# Patient Record
Sex: Female | Born: 1935
Health system: Southern US, Community
[De-identification: ages and names within clinical notes are randomized; demographics above are authoritative.]

## PROBLEM LIST (undated history)

## (undated) DIAGNOSIS — R0600 Dyspnea, unspecified: Secondary | ICD-10-CM

## (undated) DIAGNOSIS — M1990 Unspecified osteoarthritis, unspecified site: Secondary | ICD-10-CM

## (undated) DIAGNOSIS — E079 Disorder of thyroid, unspecified: Secondary | ICD-10-CM

## (undated) DIAGNOSIS — J189 Pneumonia, unspecified organism: Secondary | ICD-10-CM

## (undated) DIAGNOSIS — J449 Chronic obstructive pulmonary disease, unspecified: Secondary | ICD-10-CM

## (undated) DIAGNOSIS — I10 Essential (primary) hypertension: Secondary | ICD-10-CM

## (undated) DIAGNOSIS — M8580 Other specified disorders of bone density and structure, unspecified site: Secondary | ICD-10-CM

## (undated) DIAGNOSIS — E039 Hypothyroidism, unspecified: Secondary | ICD-10-CM

## (undated) HISTORY — PX: TUBAL LIGATION: SHX77

## (undated) HISTORY — PX: TONSILLECTOMY AND ADENOIDECTOMY: SUR1326

## (undated) HISTORY — DX: Other specified disorders of bone density and structure, unspecified site: M85.80

## (undated) HISTORY — DX: Chronic obstructive pulmonary disease, unspecified: J44.9

## (undated) HISTORY — PX: EYE SURGERY: SHX253

## (undated) HISTORY — DX: Disorder of thyroid, unspecified: E07.9

## (undated) HISTORY — PX: APPENDECTOMY: SHX54

---

## 2001-05-12 ENCOUNTER — Encounter: Payer: Self-pay | Admitting: Family Medicine

## 2001-05-12 ENCOUNTER — Encounter: Admission: RE | Admit: 2001-05-12 | Discharge: 2001-05-12 | Payer: Self-pay | Admitting: Family Medicine

## 2002-03-01 ENCOUNTER — Inpatient Hospital Stay (HOSPITAL_COMMUNITY): Admission: EM | Admit: 2002-03-01 | Discharge: 2002-03-03 | Payer: Self-pay | Admitting: Emergency Medicine

## 2002-03-01 ENCOUNTER — Encounter: Payer: Self-pay | Admitting: Emergency Medicine

## 2002-04-10 ENCOUNTER — Encounter: Admission: RE | Admit: 2002-04-10 | Discharge: 2002-04-10 | Payer: Self-pay | Admitting: Family Medicine

## 2002-04-10 ENCOUNTER — Encounter: Payer: Self-pay | Admitting: Family Medicine

## 2002-07-31 ENCOUNTER — Encounter: Admission: RE | Admit: 2002-07-31 | Discharge: 2002-07-31 | Payer: Self-pay | Admitting: Family Medicine

## 2002-07-31 ENCOUNTER — Encounter: Payer: Self-pay | Admitting: Family Medicine

## 2003-09-19 ENCOUNTER — Encounter: Admission: RE | Admit: 2003-09-19 | Discharge: 2003-09-19 | Payer: Self-pay | Admitting: Family Medicine

## 2004-02-29 ENCOUNTER — Other Ambulatory Visit: Admission: RE | Admit: 2004-02-29 | Discharge: 2004-02-29 | Payer: Self-pay | Admitting: Family Medicine

## 2004-10-30 ENCOUNTER — Encounter: Admission: RE | Admit: 2004-10-30 | Discharge: 2004-10-30 | Payer: Self-pay | Admitting: Family Medicine

## 2006-01-04 ENCOUNTER — Encounter: Admission: RE | Admit: 2006-01-04 | Discharge: 2006-01-04 | Payer: Self-pay | Admitting: Family Medicine

## 2006-03-22 ENCOUNTER — Encounter: Admission: RE | Admit: 2006-03-22 | Discharge: 2006-03-22 | Payer: Self-pay | Admitting: Family Medicine

## 2007-02-03 ENCOUNTER — Encounter: Admission: RE | Admit: 2007-02-03 | Discharge: 2007-02-03 | Payer: Self-pay | Admitting: Family Medicine

## 2007-03-23 ENCOUNTER — Other Ambulatory Visit: Admission: RE | Admit: 2007-03-23 | Discharge: 2007-03-23 | Payer: Self-pay | Admitting: Family Medicine

## 2008-02-24 ENCOUNTER — Encounter: Admission: RE | Admit: 2008-02-24 | Discharge: 2008-02-24 | Payer: Self-pay | Admitting: Family Medicine

## 2009-02-25 ENCOUNTER — Encounter: Admission: RE | Admit: 2009-02-25 | Discharge: 2009-02-25 | Payer: Self-pay | Admitting: Family Medicine

## 2009-03-27 ENCOUNTER — Other Ambulatory Visit: Admission: RE | Admit: 2009-03-27 | Discharge: 2009-03-27 | Payer: Self-pay | Admitting: Family Medicine

## 2009-03-27 ENCOUNTER — Encounter: Admission: RE | Admit: 2009-03-27 | Discharge: 2009-03-27 | Payer: Self-pay | Admitting: Family Medicine

## 2010-03-05 ENCOUNTER — Encounter: Admission: RE | Admit: 2010-03-05 | Discharge: 2010-03-05 | Payer: Self-pay | Admitting: Family Medicine

## 2011-02-06 NOTE — H&P (Signed)
[place]. [facility]  Patient:    Kathryn [name], Kathryn [name] Visit Number: [id number] MRN: [id number]          Service Type: MED Location: [phone] Attending Physician:  Selina Cooley Dictated by:   Jackie Plum, M.D. Admit Date:  03/01/2002 Discharge Date: 03/03/2002   CC:         Kathryn [name], M.D. of [facility]   History and Physical  PROBLEM LIST: 1. Right lower lobe pneumonia:    a. History of dyspnea with cough productive of yellowish-greenish sputum,       blood-tinged, fever and tachycardia, hypoxic (oxygen saturation of       around 86% to 80% on room air) with leukocytosis of 14,100, chest x-ray       finding of right lower lobe haziness. 2. History of hypothyroidism. 3. History of cigarette smoking - 47-pack-year history. 4. Status post tubal ligation. 5. Status post tonsillectomy.  CHIEF COMPLAINT:  Shortness of breath for five to six days.  HISTORY OF PRESENT ILLNESS:  The patient is a 76 year old lady with a history of hypothyroidism who presents with five to six day history of shortness of breath which has been progressively worsening over the last 48 hours, associated with fever, chills, cough, productive of yellowish-green sputum which is blood-tinged.  She also complained of pleuritic chest pain without any history of paroxysmal nocturnal dyspnea, orthopnea, palpitation, or lower extremity swelling.  There is no history of nausea, vomiting.  She had experienced some sore throat with sinus drainage about seven days previously, for which symptoms abated with Sudafed and NyQuil.  On account of progressively worsening shortness of breath with fever and cough over the last 48 hours, the patient saw her primary care physician, Dr. Donia Guiles, where upon, she was found to be hypoxic with O2 sat of around 87% on room air, and has been referred to the [facility] ED for admission.  PAST MEDICAL HISTORY:  As in the History  of Present Illness.  ALLERGIES:  No known drug allergies.  MEDICATIONS PRIOR TO ADMISSION:  Include Synthroid 112 mcg p.o. q.d.  FAMILY HISTORY:  Negative for any heart disease or stroke or hypertension, but admitted to history of diabetes mellitus in her grandmother.  There is no history of colon cancer in the family.  SOCIAL HISTORY:  She is single, married twice.  Lives in [place].  Has five children and eight grandchildren.  She recently moved here from [place]. She is retired.  She used to work for a [employer].  She smokes cigarettes as mentioned above.  She drinks alcohol occasionally.  REVIEW OF SYSTEMS:  Admits to headaches without any visual change, abdominal pain, diarrhea, or bloody stools.  She does not have any skin rash or swelling.  No joint pain.  No muscle aches.  She does not have any dysuria or frequency of micturition.  PHYSICAL EXAMINATION:  VITAL SIGNS:  Blood pressure was 116/60, temperature was 101.2 with a pulse rate of 102 per minute, respiratory rate of 20 per minute, with O2 saturation of 94% on oxygen at 2 L/min by nasal cannula.  GENERAL:  She was not in acute cardiopulmonary distress.  HEENT:  Normocephalic and atraumatic head.  She is not pale.  She is not icteric.  Pupils are equal, round and reactive to light.  Extraocular movements are intact.  Oropharynx were moist without exudate or erythema.  TMs were within normal limits.  Funduscopic exam was unremarkable.  NECK:  There was no JVD, no thyromegaly, no cervical adenopathy.  CHEST:  She did not have any reproducible chest wall tenderness.  She had equal breath sounds which were mildly decreased on the right base with rales at the right base and some wheezing.  CARDIAC:  PMI was at the 5th left intercostal space, midclavicular line.  No gallops or murmurs appreciated.  ABDOMEN:  Full with normoactive bowel sounds.  There was no tenderness.   No hepatosplenomegaly.  EXTREMITIES:  She was not cyanotic.  There was no edema.  There was no clubbing.  CNS:  She was alert and oriented x3.  There is no obvious focal deficits.  LABORATORY DATA:  WBC count of 14.1, hemoglobin of 12.9, hematocrit of 8.2, MCV of 8.7, platelet count was 201.  Sodium was 136, potassium 3.3, chloride 101, CO2 29, glucose 111, BUN 9, creatinine 0.8, calcium 9.0.  Total protein 7.0, albumin 12.4, AST 17, ALT 15, alkaline phosphatase 77, bilirubin 0.7.  Chest x-ray was remarkable for a right lower lobe haziness.  ASSESSMENT:  A 76 year old lady with a history of hypothyroidism, presenting with history of dyspnea with fever and cough productive of yellowish-greenish sputum.  The patient was hypoxic on presentation and febrile objectively.  She had leukocytosis and x-ray findings consistent with right lower lobe pneumonia.  ADMITTING DIAGNOSIS:  Right lower lobe pneumonia.  PLAN:  The patient was admitted to the telemetry bed, started on IV antibiotics.  She is wheezing, and therefore, will be given some bronchodilatation medication.  The patient is a cigarette smoker and is encouraged to discontinue her cigarette smoking for her general health, and agreed to initiate her on a nicotine patch, and she will receive outpatient reinforced counseling on discharge.  We will obtain a 12-lead EKG additionally. Dictated by:   Jackie Plum, M.D. Attending Physician:  Selina Cooley DD:  03/01/02 TD:  03/04/02 Job: [id number]

## 2011-02-18 ENCOUNTER — Other Ambulatory Visit: Payer: Self-pay | Admitting: Family Medicine

## 2011-02-18 DIAGNOSIS — Z1231 Encounter for screening mammogram for malignant neoplasm of breast: Secondary | ICD-10-CM

## 2011-03-16 ENCOUNTER — Ambulatory Visit
Admission: RE | Admit: 2011-03-16 | Discharge: 2011-03-16 | Disposition: A | Discharge status: Home / Self Care | Payer: Medicare Other | Source: Ambulatory Visit | Attending: Family Medicine | Admitting: Family Medicine

## 2011-03-16 DIAGNOSIS — Z1231 Encounter for screening mammogram for malignant neoplasm of breast: Secondary | ICD-10-CM

## 2011-10-06 DIAGNOSIS — R072 Precordial pain: Secondary | ICD-10-CM | POA: Diagnosis not present

## 2011-10-21 DIAGNOSIS — H2510 Age-related nuclear cataract, unspecified eye: Secondary | ICD-10-CM | POA: Diagnosis not present

## 2012-02-18 ENCOUNTER — Other Ambulatory Visit: Payer: Self-pay | Admitting: Family Medicine

## 2012-02-18 DIAGNOSIS — Z1231 Encounter for screening mammogram for malignant neoplasm of breast: Secondary | ICD-10-CM

## 2012-03-17 ENCOUNTER — Ambulatory Visit: Payer: BLUE CROSS/BLUE SHIELD

## 2012-03-29 ENCOUNTER — Ambulatory Visit
Admission: RE | Admit: 2012-03-29 | Discharge: 2012-03-29 | Disposition: A | Discharge status: Home / Self Care | Payer: Medicare Other | Source: Ambulatory Visit | Attending: Family Medicine | Admitting: Family Medicine

## 2012-03-29 DIAGNOSIS — Z1231 Encounter for screening mammogram for malignant neoplasm of breast: Secondary | ICD-10-CM

## 2012-04-20 ENCOUNTER — Other Ambulatory Visit: Payer: Self-pay | Admitting: Family Medicine

## 2012-04-20 ENCOUNTER — Ambulatory Visit
Admission: RE | Admit: 2012-04-20 | Discharge: 2012-04-20 | Disposition: A | Discharge status: Home / Self Care | Payer: Medicare Other | Source: Ambulatory Visit | Attending: Family Medicine | Admitting: Family Medicine

## 2012-04-20 DIAGNOSIS — J449 Chronic obstructive pulmonary disease, unspecified: Secondary | ICD-10-CM | POA: Diagnosis not present

## 2012-04-20 DIAGNOSIS — M25569 Pain in unspecified knee: Secondary | ICD-10-CM | POA: Diagnosis not present

## 2012-04-20 DIAGNOSIS — E039 Hypothyroidism, unspecified: Secondary | ICD-10-CM | POA: Diagnosis not present

## 2012-04-20 DIAGNOSIS — N318 Other neuromuscular dysfunction of bladder: Secondary | ICD-10-CM | POA: Diagnosis not present

## 2012-04-20 DIAGNOSIS — M899 Disorder of bone, unspecified: Secondary | ICD-10-CM | POA: Diagnosis not present

## 2012-04-20 DIAGNOSIS — M949 Disorder of cartilage, unspecified: Secondary | ICD-10-CM | POA: Diagnosis not present

## 2012-04-20 DIAGNOSIS — R030 Elevated blood-pressure reading, without diagnosis of hypertension: Secondary | ICD-10-CM | POA: Diagnosis not present

## 2012-04-20 DIAGNOSIS — Z0000 Encounter for general adult medical examination without abnormal findings: Secondary | ICD-10-CM | POA: Diagnosis not present

## 2012-04-20 DIAGNOSIS — Z79899 Other long term (current) drug therapy: Secondary | ICD-10-CM | POA: Diagnosis not present

## 2012-05-05 DIAGNOSIS — M25569 Pain in unspecified knee: Secondary | ICD-10-CM | POA: Diagnosis not present

## 2012-07-27 DIAGNOSIS — S8010XA Contusion of unspecified lower leg, initial encounter: Secondary | ICD-10-CM | POA: Diagnosis not present

## 2012-07-27 DIAGNOSIS — Z23 Encounter for immunization: Secondary | ICD-10-CM | POA: Diagnosis not present

## 2012-10-20 DIAGNOSIS — H2510 Age-related nuclear cataract, unspecified eye: Secondary | ICD-10-CM | POA: Diagnosis not present

## 2013-01-20 DIAGNOSIS — L988 Other specified disorders of the skin and subcutaneous tissue: Secondary | ICD-10-CM | POA: Diagnosis not present

## 2013-01-20 DIAGNOSIS — L989 Disorder of the skin and subcutaneous tissue, unspecified: Secondary | ICD-10-CM | POA: Diagnosis not present

## 2013-03-21 ENCOUNTER — Other Ambulatory Visit: Payer: Self-pay

## 2013-03-21 DIAGNOSIS — Z1231 Encounter for screening mammogram for malignant neoplasm of breast: Secondary | ICD-10-CM

## 2013-04-11 ENCOUNTER — Ambulatory Visit: Payer: Medicare Other

## 2013-04-18 ENCOUNTER — Ambulatory Visit
Admission: RE | Admit: 2013-04-18 | Discharge: 2013-04-18 | Disposition: A | Discharge status: Home / Self Care | Payer: Medicare Other | Source: Ambulatory Visit

## 2013-04-18 DIAGNOSIS — Z1231 Encounter for screening mammogram for malignant neoplasm of breast: Secondary | ICD-10-CM | POA: Diagnosis not present

## 2013-05-03 DIAGNOSIS — N318 Other neuromuscular dysfunction of bladder: Secondary | ICD-10-CM | POA: Diagnosis not present

## 2013-05-03 DIAGNOSIS — J449 Chronic obstructive pulmonary disease, unspecified: Secondary | ICD-10-CM | POA: Diagnosis not present

## 2013-05-03 DIAGNOSIS — Z79899 Other long term (current) drug therapy: Secondary | ICD-10-CM | POA: Diagnosis not present

## 2013-05-03 DIAGNOSIS — Z1331 Encounter for screening for depression: Secondary | ICD-10-CM | POA: Diagnosis not present

## 2013-05-03 DIAGNOSIS — Z136 Encounter for screening for cardiovascular disorders: Secondary | ICD-10-CM | POA: Diagnosis not present

## 2013-05-03 DIAGNOSIS — Z0000 Encounter for general adult medical examination without abnormal findings: Secondary | ICD-10-CM | POA: Diagnosis not present

## 2013-05-03 DIAGNOSIS — E039 Hypothyroidism, unspecified: Secondary | ICD-10-CM | POA: Diagnosis not present

## 2013-05-03 DIAGNOSIS — F17200 Nicotine dependence, unspecified, uncomplicated: Secondary | ICD-10-CM | POA: Diagnosis not present

## 2013-05-03 DIAGNOSIS — M949 Disorder of cartilage, unspecified: Secondary | ICD-10-CM | POA: Diagnosis not present

## 2013-05-03 DIAGNOSIS — G479 Sleep disorder, unspecified: Secondary | ICD-10-CM | POA: Diagnosis not present

## 2013-06-15 DIAGNOSIS — H2510 Age-related nuclear cataract, unspecified eye: Secondary | ICD-10-CM | POA: Diagnosis not present

## 2013-06-23 DIAGNOSIS — S0550XA Penetrating wound with foreign body of unspecified eyeball, initial encounter: Secondary | ICD-10-CM | POA: Diagnosis not present

## 2013-06-23 DIAGNOSIS — Z961 Presence of intraocular lens: Secondary | ICD-10-CM | POA: Diagnosis not present

## 2013-07-15 DIAGNOSIS — Z23 Encounter for immunization: Secondary | ICD-10-CM | POA: Diagnosis not present

## 2013-07-25 ENCOUNTER — Other Ambulatory Visit: Payer: Self-pay | Admitting: Radiology

## 2013-08-29 DIAGNOSIS — N819 Female genital prolapse, unspecified: Secondary | ICD-10-CM | POA: Diagnosis not present

## 2013-08-29 DIAGNOSIS — L989 Disorder of the skin and subcutaneous tissue, unspecified: Secondary | ICD-10-CM | POA: Diagnosis not present

## 2013-08-31 DIAGNOSIS — L570 Actinic keratosis: Secondary | ICD-10-CM | POA: Diagnosis not present

## 2013-08-31 DIAGNOSIS — D485 Neoplasm of uncertain behavior of skin: Secondary | ICD-10-CM | POA: Diagnosis not present

## 2013-08-31 DIAGNOSIS — C44320 Squamous cell carcinoma of skin of unspecified parts of face: Secondary | ICD-10-CM | POA: Diagnosis not present

## 2013-08-31 DIAGNOSIS — Z85828 Personal history of other malignant neoplasm of skin: Secondary | ICD-10-CM | POA: Diagnosis not present

## 2013-08-31 DIAGNOSIS — D0439 Carcinoma in situ of skin of other parts of face: Secondary | ICD-10-CM | POA: Diagnosis not present

## 2013-08-31 DIAGNOSIS — L821 Other seborrheic keratosis: Secondary | ICD-10-CM | POA: Diagnosis not present

## 2013-09-18 DIAGNOSIS — Z85828 Personal history of other malignant neoplasm of skin: Secondary | ICD-10-CM | POA: Diagnosis not present

## 2013-09-18 DIAGNOSIS — D0439 Carcinoma in situ of skin of other parts of face: Secondary | ICD-10-CM | POA: Diagnosis not present

## 2014-01-17 DIAGNOSIS — L821 Other seborrheic keratosis: Secondary | ICD-10-CM | POA: Diagnosis not present

## 2014-01-17 DIAGNOSIS — Z85828 Personal history of other malignant neoplasm of skin: Secondary | ICD-10-CM | POA: Diagnosis not present

## 2014-01-17 DIAGNOSIS — D1801 Hemangioma of skin and subcutaneous tissue: Secondary | ICD-10-CM | POA: Diagnosis not present

## 2014-01-17 DIAGNOSIS — L570 Actinic keratosis: Secondary | ICD-10-CM | POA: Diagnosis not present

## 2014-03-27 ENCOUNTER — Other Ambulatory Visit: Payer: Self-pay

## 2014-03-27 DIAGNOSIS — Z1231 Encounter for screening mammogram for malignant neoplasm of breast: Secondary | ICD-10-CM

## 2014-04-19 ENCOUNTER — Ambulatory Visit
Admission: RE | Admit: 2014-04-19 | Discharge: 2014-04-19 | Disposition: A | Discharge status: Home / Self Care | Payer: Medicare Other | Source: Ambulatory Visit

## 2014-04-19 ENCOUNTER — Encounter (INDEPENDENT_AMBULATORY_CARE_PROVIDER_SITE_OTHER): Payer: Self-pay

## 2014-04-19 DIAGNOSIS — Z1231 Encounter for screening mammogram for malignant neoplasm of breast: Secondary | ICD-10-CM | POA: Diagnosis not present

## 2014-05-24 DIAGNOSIS — M899 Disorder of bone, unspecified: Secondary | ICD-10-CM | POA: Diagnosis not present

## 2014-05-24 DIAGNOSIS — Z79899 Other long term (current) drug therapy: Secondary | ICD-10-CM | POA: Diagnosis not present

## 2014-05-24 DIAGNOSIS — F17200 Nicotine dependence, unspecified, uncomplicated: Secondary | ICD-10-CM | POA: Diagnosis not present

## 2014-05-24 DIAGNOSIS — M159 Polyosteoarthritis, unspecified: Secondary | ICD-10-CM | POA: Diagnosis not present

## 2014-05-24 DIAGNOSIS — Z23 Encounter for immunization: Secondary | ICD-10-CM | POA: Diagnosis not present

## 2014-05-24 DIAGNOSIS — G479 Sleep disorder, unspecified: Secondary | ICD-10-CM | POA: Diagnosis not present

## 2014-05-24 DIAGNOSIS — N318 Other neuromuscular dysfunction of bladder: Secondary | ICD-10-CM | POA: Diagnosis not present

## 2014-05-24 DIAGNOSIS — E039 Hypothyroidism, unspecified: Secondary | ICD-10-CM | POA: Diagnosis not present

## 2014-05-24 DIAGNOSIS — Z0000 Encounter for general adult medical examination without abnormal findings: Secondary | ICD-10-CM | POA: Diagnosis not present

## 2014-05-24 DIAGNOSIS — J449 Chronic obstructive pulmonary disease, unspecified: Secondary | ICD-10-CM | POA: Diagnosis not present

## 2014-07-25 DIAGNOSIS — L570 Actinic keratosis: Secondary | ICD-10-CM | POA: Diagnosis not present

## 2014-07-25 DIAGNOSIS — C44329 Squamous cell carcinoma of skin of other parts of face: Secondary | ICD-10-CM | POA: Diagnosis not present

## 2014-07-25 DIAGNOSIS — Z85828 Personal history of other malignant neoplasm of skin: Secondary | ICD-10-CM | POA: Diagnosis not present

## 2014-07-25 DIAGNOSIS — L821 Other seborrheic keratosis: Secondary | ICD-10-CM | POA: Diagnosis not present

## 2014-08-06 DIAGNOSIS — C44329 Squamous cell carcinoma of skin of other parts of face: Secondary | ICD-10-CM | POA: Diagnosis not present

## 2014-08-06 DIAGNOSIS — Z85828 Personal history of other malignant neoplasm of skin: Secondary | ICD-10-CM | POA: Diagnosis not present

## 2015-01-08 DIAGNOSIS — D2312 Other benign neoplasm of skin of left eyelid, including canthus: Secondary | ICD-10-CM | POA: Diagnosis not present

## 2015-02-01 DIAGNOSIS — J320 Chronic maxillary sinusitis: Secondary | ICD-10-CM | POA: Diagnosis not present

## 2015-04-08 DIAGNOSIS — L821 Other seborrheic keratosis: Secondary | ICD-10-CM | POA: Diagnosis not present

## 2015-04-08 DIAGNOSIS — D485 Neoplasm of uncertain behavior of skin: Secondary | ICD-10-CM | POA: Diagnosis not present

## 2015-04-16 ENCOUNTER — Other Ambulatory Visit: Payer: Self-pay

## 2015-04-16 DIAGNOSIS — Z1231 Encounter for screening mammogram for malignant neoplasm of breast: Secondary | ICD-10-CM

## 2015-04-24 ENCOUNTER — Ambulatory Visit
Admission: RE | Admit: 2015-04-24 | Discharge: 2015-04-24 | Disposition: A | Discharge status: Home / Self Care | Payer: Medicare Other | Source: Ambulatory Visit

## 2015-04-24 DIAGNOSIS — Z1231 Encounter for screening mammogram for malignant neoplasm of breast: Secondary | ICD-10-CM | POA: Diagnosis not present

## 2015-05-30 DIAGNOSIS — G4700 Insomnia, unspecified: Secondary | ICD-10-CM | POA: Diagnosis not present

## 2015-05-30 DIAGNOSIS — E039 Hypothyroidism, unspecified: Secondary | ICD-10-CM | POA: Diagnosis not present

## 2015-05-30 DIAGNOSIS — Z0000 Encounter for general adult medical examination without abnormal findings: Secondary | ICD-10-CM | POA: Diagnosis not present

## 2015-05-30 DIAGNOSIS — F17200 Nicotine dependence, unspecified, uncomplicated: Secondary | ICD-10-CM | POA: Diagnosis not present

## 2015-05-30 DIAGNOSIS — M179 Osteoarthritis of knee, unspecified: Secondary | ICD-10-CM | POA: Diagnosis not present

## 2015-05-30 DIAGNOSIS — M899 Disorder of bone, unspecified: Secondary | ICD-10-CM | POA: Diagnosis not present

## 2015-05-30 DIAGNOSIS — Z23 Encounter for immunization: Secondary | ICD-10-CM | POA: Diagnosis not present

## 2015-05-30 DIAGNOSIS — Z79899 Other long term (current) drug therapy: Secondary | ICD-10-CM | POA: Diagnosis not present

## 2015-05-30 DIAGNOSIS — J449 Chronic obstructive pulmonary disease, unspecified: Secondary | ICD-10-CM | POA: Diagnosis not present

## 2016-04-09 ENCOUNTER — Other Ambulatory Visit: Payer: Self-pay | Admitting: Family Medicine

## 2016-04-09 DIAGNOSIS — Z1231 Encounter for screening mammogram for malignant neoplasm of breast: Secondary | ICD-10-CM

## 2016-04-24 ENCOUNTER — Ambulatory Visit
Admission: RE | Admit: 2016-04-24 | Discharge: 2016-04-24 | Disposition: A | Discharge status: Home / Self Care | Payer: Medicare Other | Source: Ambulatory Visit | Attending: Family Medicine | Admitting: Family Medicine

## 2016-04-24 DIAGNOSIS — Z1231 Encounter for screening mammogram for malignant neoplasm of breast: Secondary | ICD-10-CM | POA: Diagnosis not present

## 2016-07-04 DIAGNOSIS — Z23 Encounter for immunization: Secondary | ICD-10-CM | POA: Diagnosis not present

## 2016-08-03 DIAGNOSIS — Z0000 Encounter for general adult medical examination without abnormal findings: Secondary | ICD-10-CM | POA: Diagnosis not present

## 2016-08-03 DIAGNOSIS — F17200 Nicotine dependence, unspecified, uncomplicated: Secondary | ICD-10-CM | POA: Diagnosis not present

## 2016-08-03 DIAGNOSIS — R030 Elevated blood-pressure reading, without diagnosis of hypertension: Secondary | ICD-10-CM | POA: Diagnosis not present

## 2016-08-03 DIAGNOSIS — G4700 Insomnia, unspecified: Secondary | ICD-10-CM | POA: Diagnosis not present

## 2016-08-03 DIAGNOSIS — Z79899 Other long term (current) drug therapy: Secondary | ICD-10-CM | POA: Diagnosis not present

## 2016-08-03 DIAGNOSIS — M899 Disorder of bone, unspecified: Secondary | ICD-10-CM | POA: Diagnosis not present

## 2016-08-03 DIAGNOSIS — E039 Hypothyroidism, unspecified: Secondary | ICD-10-CM | POA: Diagnosis not present

## 2016-08-03 DIAGNOSIS — J449 Chronic obstructive pulmonary disease, unspecified: Secondary | ICD-10-CM | POA: Diagnosis not present

## 2016-08-03 DIAGNOSIS — M179 Osteoarthritis of knee, unspecified: Secondary | ICD-10-CM | POA: Diagnosis not present

## 2016-08-07 ENCOUNTER — Encounter (INDEPENDENT_AMBULATORY_CARE_PROVIDER_SITE_OTHER): Payer: Self-pay | Admitting: Orthopaedic Surgery

## 2016-08-07 ENCOUNTER — Ambulatory Visit (INDEPENDENT_AMBULATORY_CARE_PROVIDER_SITE_OTHER): Payer: Medicare Other | Admitting: Orthopaedic Surgery

## 2016-08-07 ENCOUNTER — Ambulatory Visit (INDEPENDENT_AMBULATORY_CARE_PROVIDER_SITE_OTHER): Payer: Medicare Other

## 2016-08-07 DIAGNOSIS — G8929 Other chronic pain: Secondary | ICD-10-CM

## 2016-08-07 DIAGNOSIS — M25561 Pain in right knee: Secondary | ICD-10-CM | POA: Diagnosis not present

## 2016-08-07 DIAGNOSIS — M1711 Unilateral primary osteoarthritis, right knee: Secondary | ICD-10-CM | POA: Insufficient documentation

## 2016-08-07 HISTORY — DX: Other chronic pain: G89.29

## 2016-08-07 NOTE — Progress Notes (Signed)
Office Visit Note   Patient: Kathryn [name]           Date of Birth: 15-May-1935           MRN: [id number] Visit Date: 08/07/2016              Requested by: Mayra Neer, MD [address] [place], [place] [address] PCP: No primary care provider on file.   Assessment & Plan: Visit Diagnoses:  1. Chronic pain of right knee     Plan: Patient has primarily localized medial compartment degenerative joint disease. A steroid injection was performed today. Patient tolerated this well. She has my card if she needs me. Follow-up with me as needed.  Follow-Up Instructions: No Follow-up on file.   Orders:  Orders Placed This Encounter  Procedures  . XR KNEE 3 VIEW RIGHT   No orders of the defined types were placed in this encounter.     Procedures: Large Joint Inj Date/Time: 08/07/2016 5:06 PM Performed by: Leandrew Koyanagi Authorized by: Leandrew Koyanagi   Consent Given by:  Patient Timeout: prior to procedure the correct patient, procedure, and site was verified   Indications:  Pain Location:  Knee Site:  R knee Prep: patient was prepped and draped in usual sterile fashion   Needle Size:  22 G Approach:  Anterolateral Ultrasound Guidance: No   Fluoroscopic Guidance: No       Clinical Data: No additional findings.   Subjective: Chief Complaint  Patient presents with  . Right Knee - Pain, New Patient (Initial Visit)    Patient is a 81 year old female with right knee pain for 3-4 years that is getting worse especially on the medial aspect of the knee. She has constant pain that feels like it needs to pop. She denies any mechanical symptoms. The pain is worse with ambulation and better with rest. The pain does not radiate. Exposed the counter Aleve which gives her temporary relief.    Review of Systems  Constitutional: Negative.   HENT: Negative.   Eyes: Negative.   Respiratory: Negative.   Cardiovascular: Negative.   Endocrine: Negative.     Musculoskeletal: Negative.   Neurological: Negative.   Hematological: Negative.   Psychiatric/Behavioral: Negative.   All other systems reviewed and are negative.    Objective: Vital Signs: There were no vitals taken for this visit.  Physical Exam  Constitutional: She is oriented to person, place, and time. She appears well-developed and well-nourished.  HENT:  Head: Atraumatic.  Eyes: EOM are normal.  Neck: Neck supple.  Cardiovascular: Intact distal pulses.   Pulmonary/Chest: Effort normal.  Abdominal: Soft.  Neurological: She is alert and oriented to person, place, and time.  Skin: Skin is warm. Capillary refill takes less than 2 seconds.  Psychiatric: She has a normal mood and affect. Her behavior is normal. Judgment and thought content normal.  Nursing note and vitals reviewed.   Ortho Exam Exam of the right knee shows no effusion. She has good range of motion. Collaterals and cruciates are stable. She has medial joint line tenderness. Specialty Comments:  No specialty comments available.  Imaging: No results found.   PMFS History: There are no active problems to display for this patient.  Past Medical History:  Diagnosis Date  . COPD (chronic obstructive pulmonary disease) ([place])   . Osteopenia   . Thyroid disease     No family history on file.  Past Surgical History:  Procedure Laterality Date  .  APPENDECTOMY    . TONSILLECTOMY AND ADENOIDECTOMY    . TUBAL LIGATION     Social History   Occupational History  . Not on file.   Social History Main Topics  . Smoking status: Current Some Day Smoker  . Smokeless tobacco: Not on file  . Alcohol use Not on file  . Drug use: Unknown  . Sexual activity: Not on file

## 2016-08-07 NOTE — Patient Instructions (Signed)

## 2016-09-04 DIAGNOSIS — Z79899 Other long term (current) drug therapy: Secondary | ICD-10-CM | POA: Diagnosis not present

## 2017-02-10 ENCOUNTER — Ambulatory Visit
Admission: RE | Admit: 2017-02-10 | Discharge: 2017-02-10 | Disposition: A | Discharge status: Home / Self Care | Payer: Medicare Other | Source: Ambulatory Visit | Attending: Family Medicine | Admitting: Family Medicine

## 2017-02-10 ENCOUNTER — Other Ambulatory Visit: Payer: Self-pay | Admitting: Family Medicine

## 2017-02-10 DIAGNOSIS — M542 Cervicalgia: Secondary | ICD-10-CM | POA: Diagnosis not present

## 2017-02-10 DIAGNOSIS — R609 Edema, unspecified: Secondary | ICD-10-CM | POA: Diagnosis not present

## 2017-02-10 DIAGNOSIS — I10 Essential (primary) hypertension: Secondary | ICD-10-CM | POA: Diagnosis not present

## 2017-02-10 DIAGNOSIS — M47812 Spondylosis without myelopathy or radiculopathy, cervical region: Secondary | ICD-10-CM

## 2017-02-10 DIAGNOSIS — J309 Allergic rhinitis, unspecified: Secondary | ICD-10-CM | POA: Diagnosis not present

## 2017-02-10 DIAGNOSIS — M4692 Unspecified inflammatory spondylopathy, cervical region: Secondary | ICD-10-CM | POA: Diagnosis not present

## 2017-06-02 ENCOUNTER — Other Ambulatory Visit: Payer: Self-pay | Admitting: Family Medicine

## 2017-06-02 DIAGNOSIS — Z1231 Encounter for screening mammogram for malignant neoplasm of breast: Secondary | ICD-10-CM

## 2017-06-17 ENCOUNTER — Ambulatory Visit
Admission: RE | Admit: 2017-06-17 | Discharge: 2017-06-17 | Disposition: A | Discharge status: Home / Self Care | Payer: Medicare Other | Source: Ambulatory Visit | Attending: Family Medicine | Admitting: Family Medicine

## 2017-06-17 DIAGNOSIS — Z1231 Encounter for screening mammogram for malignant neoplasm of breast: Secondary | ICD-10-CM

## 2017-08-18 DIAGNOSIS — I10 Essential (primary) hypertension: Secondary | ICD-10-CM | POA: Diagnosis not present

## 2017-08-18 DIAGNOSIS — G4700 Insomnia, unspecified: Secondary | ICD-10-CM | POA: Diagnosis not present

## 2017-08-18 DIAGNOSIS — M899 Disorder of bone, unspecified: Secondary | ICD-10-CM | POA: Diagnosis not present

## 2017-08-18 DIAGNOSIS — E039 Hypothyroidism, unspecified: Secondary | ICD-10-CM | POA: Diagnosis not present

## 2017-08-18 DIAGNOSIS — M179 Osteoarthritis of knee, unspecified: Secondary | ICD-10-CM | POA: Diagnosis not present

## 2017-08-18 DIAGNOSIS — M4692 Unspecified inflammatory spondylopathy, cervical region: Secondary | ICD-10-CM | POA: Diagnosis not present

## 2017-08-18 DIAGNOSIS — J449 Chronic obstructive pulmonary disease, unspecified: Secondary | ICD-10-CM | POA: Diagnosis not present

## 2017-08-18 DIAGNOSIS — Z23 Encounter for immunization: Secondary | ICD-10-CM | POA: Diagnosis not present

## 2017-08-18 DIAGNOSIS — Z0000 Encounter for general adult medical examination without abnormal findings: Secondary | ICD-10-CM | POA: Diagnosis not present

## 2017-10-20 DIAGNOSIS — H40053 Ocular hypertension, bilateral: Secondary | ICD-10-CM | POA: Diagnosis not present

## 2017-12-28 DIAGNOSIS — H40011 Open angle with borderline findings, low risk, right eye: Secondary | ICD-10-CM | POA: Diagnosis not present

## 2018-04-21 DIAGNOSIS — I2699 Other pulmonary embolism without acute cor pulmonale: Secondary | ICD-10-CM

## 2018-04-21 HISTORY — DX: Other pulmonary embolism without acute cor pulmonale: I26.99

## 2018-05-17 ENCOUNTER — Emergency Department (HOSPITAL_COMMUNITY)
Admission: EM | Admit: 2018-05-17 | Discharge: 2018-05-17 | Disposition: A | Discharge status: Home / Self Care | Payer: Medicare Other | Attending: Emergency Medicine | Admitting: Emergency Medicine

## 2018-05-17 ENCOUNTER — Other Ambulatory Visit: Payer: Self-pay

## 2018-05-17 ENCOUNTER — Encounter (HOSPITAL_COMMUNITY): Payer: Self-pay | Admitting: Emergency Medicine

## 2018-05-17 ENCOUNTER — Emergency Department (HOSPITAL_COMMUNITY): Payer: Medicare Other

## 2018-05-17 DIAGNOSIS — I2699 Other pulmonary embolism without acute cor pulmonale: Secondary | ICD-10-CM

## 2018-05-17 DIAGNOSIS — F17210 Nicotine dependence, cigarettes, uncomplicated: Secondary | ICD-10-CM | POA: Diagnosis not present

## 2018-05-17 DIAGNOSIS — Z79899 Other long term (current) drug therapy: Secondary | ICD-10-CM | POA: Insufficient documentation

## 2018-05-17 DIAGNOSIS — R079 Chest pain, unspecified: Secondary | ICD-10-CM | POA: Diagnosis not present

## 2018-05-17 DIAGNOSIS — J449 Chronic obstructive pulmonary disease, unspecified: Secondary | ICD-10-CM | POA: Insufficient documentation

## 2018-05-17 DIAGNOSIS — R0602 Shortness of breath: Secondary | ICD-10-CM | POA: Diagnosis not present

## 2018-05-17 LAB — CBC
HCT: 40.4 % (ref 36.0–46.0)
HEMOGLOBIN: 13 g/dL (ref 12.0–15.0)
MCH: 29.5 pg (ref 26.0–34.0)
MCHC: 32.2 g/dL (ref 30.0–36.0)
MCV: 91.6 fL (ref 78.0–100.0)
PLATELETS: 193 10*3/uL (ref 150–400)
RBC: 4.41 MIL/uL (ref 3.87–5.11)
RDW: 13.8 % (ref 11.5–15.5)
WBC: 9 10*3/uL (ref 4.0–10.5)

## 2018-05-17 LAB — BASIC METABOLIC PANEL
Anion gap: 9 (ref 5–15)
BUN: 10 mg/dL (ref 8–23)
CALCIUM: 9.5 mg/dL (ref 8.9–10.3)
CO2: 25 mmol/L (ref 22–32)
CREATININE: 0.76 mg/dL (ref 0.44–1.00)
Chloride: 105 mmol/L (ref 98–111)
GFR calc Af Amer: >60 mL/min (ref >60)
GLUCOSE: 96 mg/dL (ref 70–99)
Potassium: 4.1 mmol/L (ref 3.5–5.1)
Sodium: 139 mmol/L (ref 135–145)

## 2018-05-17 LAB — D-DIMER, QUANTITATIVE: D-Dimer, Quant: 0.83 ug/mL-FEU — ABNORMAL HIGH (ref 0.00–0.50)

## 2018-05-17 LAB — I-STAT TROPONIN, ED: Troponin i, poc: 0.01 ng/mL (ref 0.00–0.08)

## 2018-05-17 MED ORDER — RIVAROXABAN 20 MG PO TABS: 20.0000 mg | ORAL_TABLET | Freq: Every day | ORAL | Status: DC

## 2018-05-17 MED ORDER — IOPAMIDOL (ISOVUE-370) INJECTION 76%
INTRAVENOUS | Status: AC
  Filled 2018-05-17: qty 100

## 2018-05-17 MED ORDER — RIVAROXABAN 15 MG PO TABS
15.0000 mg | ORAL_TABLET | Freq: Two times a day (BID) | ORAL | Status: DC
  Administered 2018-05-17: 15 mg via ORAL
  Filled 2018-05-17: qty 1

## 2018-05-17 MED ORDER — IOPAMIDOL (ISOVUE-370) INJECTION 76%
100.0000 mL | Freq: Once | INTRAVENOUS | Status: AC | PRN
  Administered 2018-05-17: 100 mL via INTRAVENOUS

## 2018-05-17 MED ORDER — RIVAROXABAN (XARELTO) EDUCATION KIT FOR DVT/PE PATIENTS
PACK | Freq: Once | Status: AC
  Administered 2018-05-17: 16:00:00
  Filled 2018-05-17: qty 1

## 2018-05-17 MED ORDER — ACETAMINOPHEN 325 MG PO TABS
650.0000 mg | ORAL_TABLET | Freq: Once | ORAL | Status: AC
  Administered 2018-05-17: 650 mg via ORAL
  Filled 2018-05-17: qty 2

## 2018-05-17 MED ORDER — RIVAROXABAN (XARELTO) VTE STARTER PACK (15 & 20 MG): ORAL_TABLET | ORAL | 0 refills | Status: DC

## 2018-05-17 MED ORDER — ASPIRIN 81 MG PO CHEW
324.0000 mg | CHEWABLE_TABLET | Freq: Once | ORAL | Status: AC
  Administered 2018-05-17: 324 mg via ORAL
  Filled 2018-05-17: qty 4

## 2018-05-17 NOTE — Discharge Instructions (Signed)
If you develop worsening chest pain, shortness of breath, start coughing up blood, or have any other new/concerning symptoms and return to the ER for evaluation.  Otherwise follow-up with your primary care physician in 2 days.  Because of this medicine, Xarelto, you may not take any other anti-inflammatories or aspirin like products including no aspirin, ibuprofen, Motrin, naproxen, Aleve, etc.  If you develop a spontaneous headache or injure your head, or develop bleeding then return to the ER or follow-up with your primary care physician.    Information on my medicine - XARELTO (rivaroxaban)  This medication education was reviewed with me or my healthcare representative as part of my discharge preparation.  The pharmacist that spoke with me during my hospital stay was:  Saundra Shelling, [place]? Xarelto was prescribed to treat blood clots that may have been found in the veins of your legs (deep vein thrombosis) or in your lungs (pulmonary embolism) and to reduce the risk of them occurring again.  What do you need to know about Xarelto? The starting dose is one 15 mg tablet taken TWICE daily with food for the FIRST 21 DAYS then on (enter date)  06/07/18  the dose is changed to one 20 mg tablet taken ONCE A DAY with your evening meal.  DO NOT stop taking Xarelto without talking to the health care provider who prescribed the medication.  Refill your prescription for 20 mg tablets before you run out.  After discharge, you should have regular check-up appointments with your healthcare provider that is prescribing your Xarelto.  In the future your dose may need to be changed if your kidney function changes by a significant amount.  What do you do if you miss a dose? If you are taking Xarelto TWICE DAILY and you miss a dose, take it as soon as you remember. You may take two 15 mg tablets (total 30 mg) at the same time then resume your regularly scheduled 15 mg  twice daily the next day.  If you are taking Xarelto ONCE DAILY and you miss a dose, take it as soon as you remember on the same day then continue your regularly scheduled once daily regimen the next day. Do not take two doses of Xarelto at the same time.   Important Safety Information Xarelto is a blood thinner medicine that can cause bleeding. You should call your healthcare provider right away if you experience any of the following: ? Bleeding from an injury or your nose that does not stop. ? Unusual colored urine (red or dark brown) or unusual colored stools (red or black). ? Unusual bruising for unknown reasons. ? A serious fall or if you hit your head (even if there is no bleeding).  Some medicines may interact with Xarelto and might increase your risk of bleeding while on Xarelto. To help avoid this, consult your healthcare provider or pharmacist prior to using any new prescription or non-prescription medications, including herbals, vitamins, non-steroidal anti-inflammatory drugs (NSAIDs) and supplements.  This website has more information on Xarelto: [email or web address]

## 2018-05-17 NOTE — ED Notes (Signed)
ED Provider at bedside. 

## 2018-05-17 NOTE — ED Notes (Signed)
Pt states pain is a dull ache but increases in intensity with movement or deep breaths.

## 2018-05-17 NOTE — ED Provider Notes (Signed)
[place] EMERGENCY DEPARTMENT Provider Note   CSN: [id number] Arrival date & time: 05/17/18  1027     History   Chief Complaint Chief Complaint  Patient presents with  . Chest Pain    HPI Kathryn [name] is a 83 y.o. female.  HPI  83 year old female with a history of COPD, hypothyroidism and hypertension presents with chest pain.  Started yesterday morning.  Has been constant since and feels like a dull ache.  It is in the left side of her chest.  Worsens when she takes a deep breaths and if she rolls on her left side.  No back pain or abdominal pain.  No shortness of breath, abdominal pain, diaphoresis, nausea/vomiting, or new leg swelling.  She chronically has some leg swelling that is unchanged.  Pain is about a 4 at rest.  Exertion does not make the pain worse.  She tried Aleve yesterday and it did not help. No recent travel or illness. No rash noted.  Past Medical History:  Diagnosis Date  . COPD (chronic obstructive pulmonary disease) ([place])   . Osteopenia   . Thyroid disease     Patient Active Problem List   Diagnosis Date Noted  . Unilateral primary osteoarthritis, right knee 08/07/2016  . Chronic pain of right knee 08/07/2016    Past Surgical History:  Procedure Laterality Date  . APPENDECTOMY    . TONSILLECTOMY AND ADENOIDECTOMY    . TUBAL LIGATION       OB History   None      Home Medications    Prior to Admission medications   Medication Sig Start Date End Date Taking? Authorizing Provider  Calcium Carbonate-Vitamin D (CALTRATE 600+D PO) Take 1 tablet by mouth daily.    Yes [provider]  levothyroxine (SYNTHROID, LEVOTHROID) 100 MCG tablet Take 100 mcg by mouth daily before breakfast.   Yes [provider]  losartan (COZAAR) 50 MG tablet Take 50 mg by mouth daily. 03/13/18  Yes [provider]    Family History Family History  Problem Relation Age of Onset  . Breast cancer Mother   . Breast  cancer Daughter     Social History Social History   Tobacco Use  . Smoking status: Current Some Day Smoker  Substance Use Topics  . Alcohol use: Not on file  . Drug use: Not on file     Allergies   Patient has no known allergies.   Review of Systems Review of Systems  Constitutional: Negative for fever.  Respiratory: Negative for shortness of breath.   Cardiovascular: Positive for chest pain and leg swelling (chronic).  Gastrointestinal: Negative for abdominal pain.  Musculoskeletal: Negative for back pain.  All other systems reviewed and are negative.    Physical Exam Updated Vital Signs BP (!) 158/78   Pulse 79   Temp 98.5 F (36.9 C) (Oral)   Resp 19   Ht '5\' 5"'$  (1.651 m)   Wt 72.6 kg   SpO2 95%   BMI 26.63 kg/m   Physical Exam  Constitutional: She is oriented to person, place, and time. She appears well-developed and well-nourished.  Non-toxic appearance. She does not appear ill. No distress.  HENT:  Head: Normocephalic and atraumatic.  Right Ear: External ear normal.  Left Ear: External ear normal.  Nose: Nose normal.  Eyes: Right eye exhibits no discharge. Left eye exhibits no discharge.  Cardiovascular: Normal rate, regular rhythm and normal heart sounds.  Pulmonary/Chest: Effort normal and breath  sounds normal. She exhibits tenderness.    Abdominal: Soft. There is no tenderness.  Musculoskeletal:  No pitting edema to BLE  Neurological: She is alert and oriented to person, place, and time.  Skin: Skin is warm and dry. No rash noted. She is not diaphoretic.  Nursing note and vitals reviewed.    ED Treatments / Results  Labs (all labs ordered are listed, but only abnormal results are displayed) Labs Reviewed  D-DIMER, QUANTITATIVE (NOT AT [facility]) - Abnormal; Notable for the following components:      Result Value   D-Dimer, Quant 0.83 (*)    All other components within normal limits  BASIC METABOLIC PANEL  CBC  I-STAT TROPONIN, ED     EKG EKG Interpretation  Date/Time:  Tuesday May 17 2018 10:38:17 EDT Ventricular Rate:  84 PR Interval:  144 QRS Duration: 80 QT Interval:  368 QTC Calculation: 434 R Axis:   22 Text Interpretation:  Sinus rhythm with Premature atrial complexes Possible Left atrial enlargement Nonspecific T wave abnormality Abnormal ECG Confirmed by Sherwood Gambler [id number]) on 05/17/2018 12:05:47 PM   Radiology Dg Chest 2 View  Result Date: 05/17/2018 CLINICAL DATA:  Left chest pain beginning yesterday. History of COPD. EXAM: CHEST - 2 VIEW COMPARISON:  PA and lateral chest 03/27/2009 and 04/10/2010. FINDINGS: The chest is hyperexpanded with attenuation of the pulmonary vasculature. Trace bilateral pleural effusions are seen. Lungs are clear. Heart size is upper normal. Aortic atherosclerosis is noted. No acute or focal abnormality. IMPRESSION: A trace bilateral pleural effusions. Emphysema. Atherosclerosis. Electronically Signed   By: Inge Rise M.D.   On: 05/17/2018 11:16   Ct Angio Chest Pe W/cm &/or Wo Cm  Result Date: 05/17/2018 CLINICAL DATA:  Chest pain and shortness of breath EXAM: CT ANGIOGRAPHY CHEST WITH CONTRAST TECHNIQUE: Multidetector CT imaging of the chest was performed using the standard protocol during bolus administration of intravenous contrast. Multiplanar CT image reconstructions and MIPs were obtained to evaluate the vascular anatomy. CONTRAST:  125m ISOVUE-370 IOPAMIDOL (ISOVUE-370) INJECTION 76%, COMPARISON:  Chest radiograph 05/17/2018 FINDINGS: Cardiovascular: --Pulmonary arteries: Contrast injection is sufficient to demonstrate satisfactory opacification of the pulmonary arteries to the segmental level.There is a embolus within the lingular artery extending into the superior and inferior lingular segmental arteries. No other filling defect. The main pulmonary artery is enlarged, measuring 3.7 cm. There is no evidence of right heart strain. Opacity at the medial anterior  aspect of the lingula may indicate early pulmonary infarction. --Aorta: Limited opacification of the aorta due to bolus timing optimization for the pulmonary arteries. Conventional 3 vessel aortic branching pattern. The aortic course and caliber are normal. There is mild aortic atherosclerosis. --Heart: The heart size is normal. No pericardial effusion. There are coronary artery calcifications. Mediastinum/Nodes: No mediastinal, hilar or axillary lymphadenopathy. The visualized thyroid and thoracic esophageal course are unremarkable. Lungs/Pleura: No pulmonary nodules or masses. No pleural effusion or pneumothorax. No focal airspace consolidation. No focal pleural abnormality. Upper Abdomen: Contrast bolus timing is not optimized for evaluation of the abdominal organs. Within this limitation, the visualized organs of the upper abdomen are normal. Musculoskeletal: No chest wall abnormality. No acute or significant osseous findings. Review of the MIP images confirms the above findings. IMPRESSION: 1. Small pulmonary embolus within the lingular artery extending into the superior and inferior lingular segmental arteries. No right heart strain. Opacity at the medial anterior aspect of the lingula may indicate early infarction. 2. Coronary artery and aortic atherosclerosis (ICD10-I70.0). 3. Enlarged main pulmonary  artery may indicate underlying pulmonary hypertension. Critical Value/emergent results were called by telephone at the time of interpretation on 05/17/2018 at 2:40 pm to Dr. Sherwood Gambler , who verbally acknowledged these results. Electronically Signed   By: Ulyses Jarred M.D.   On: 05/17/2018 14:48    Procedures Procedures (including critical care time)  Medications Ordered in ED Medications  iopamidol (ISOVUE-370) 76 % injection (has no administration in time range)  Rivaroxaban (XARELTO) tablet 15 mg (has no administration in time range)    Followed by  rivaroxaban (XARELTO) tablet 20 mg (has no  administration in time range)  rivaroxaban [employer]) Education Kit for DVT/PE patients (has no administration in time range)  aspirin chewable tablet 324 mg (324 mg Oral Given 05/17/18 1238)  iopamidol (ISOVUE-370) 76 % injection 100 mL (100 mLs Intravenous Contrast Given 05/17/18 1417)     Initial Impression / Assessment and Plan / ED Course  I have reviewed the triage vital signs and the nursing notes.  Pertinent labs & imaging results that were available during my care of the patient were reviewed by me and considered in my medical decision making (see chart for details).     Patient is found to have a segmental pulmonary embolus.  This is on the left anterior aspect of her lung and likely causing her left-sided pleuritic pain.  No RV strain or signs of more severe disease. She is not hypoxic, tachycardic, hypotensive or in distress. Her PESI score is 81, indicated low risk for mortality.  Discussed with patient and family and they agree for outpatient management.  Will be started on Xarelto and pharmacy has been consulted to assist with this.  I have discussed risks/benefits of anticoagulation.  She shows no signs or symptoms of clinical DVT.  She appears stable for discharge home and we discussed using Tylenol for pain and no NSAIDs or aspirins.  Strict return precautions and discussed the importance of close follow-up with PCP.  Given negative troponin after 24+ hours of chest pain I highly doubt ACS.  Final Clinical Impressions(s) / ED Diagnoses   Final diagnoses:  Other acute pulmonary embolism without acute cor pulmonale [facility])    ED Discharge Orders    None       Sherwood Gambler, MD 05/17/18 [phone]

## 2018-05-17 NOTE — ED Triage Notes (Signed)
Pt reports left sided chest pain that started started as a dull ache, pain is worse with a deep breath, pt states it "feels like my lungs".

## 2018-05-17 NOTE — ED Notes (Signed)
Pt stable, ambulatory, and verbalizes understanding of d/c instructions.  

## 2018-05-17 NOTE — Discharge Planning (Signed)
Bryley Kovacevic J. Clydene Laming, RN, BSN, [employer] [phone] Pt given brochure with 30 day free card and refill assistance card intact.  Pt utilizes [facility] on [address] for prescription needs.  NCM called pharmacy to confirm availability of medication.  Information relayed to pt.  Pt verbalizes importance of filling medication upon discharge.

## 2018-05-17 NOTE — ED Notes (Signed)
Patient transported to CT 

## 2018-05-18 ENCOUNTER — Emergency Department (HOSPITAL_COMMUNITY): Payer: Medicare Other

## 2018-05-18 ENCOUNTER — Other Ambulatory Visit: Payer: Self-pay

## 2018-05-18 ENCOUNTER — Encounter (HOSPITAL_COMMUNITY): Payer: Self-pay | Admitting: Emergency Medicine

## 2018-05-18 ENCOUNTER — Emergency Department (HOSPITAL_COMMUNITY)
Admission: EM | Admit: 2018-05-18 | Discharge: 2018-05-18 | Disposition: A | Discharge status: Home / Self Care | Payer: Medicare Other | Attending: Emergency Medicine | Admitting: Emergency Medicine

## 2018-05-18 DIAGNOSIS — Z87891 Personal history of nicotine dependence: Secondary | ICD-10-CM | POA: Insufficient documentation

## 2018-05-18 DIAGNOSIS — Z79899 Other long term (current) drug therapy: Secondary | ICD-10-CM | POA: Diagnosis not present

## 2018-05-18 DIAGNOSIS — J449 Chronic obstructive pulmonary disease, unspecified: Secondary | ICD-10-CM | POA: Diagnosis not present

## 2018-05-18 DIAGNOSIS — R079 Chest pain, unspecified: Secondary | ICD-10-CM | POA: Diagnosis not present

## 2018-05-18 DIAGNOSIS — I2699 Other pulmonary embolism without acute cor pulmonale: Secondary | ICD-10-CM | POA: Insufficient documentation

## 2018-05-18 LAB — BASIC METABOLIC PANEL
Anion gap: 8 (ref 5–15)
BUN: 12 mg/dL (ref 8–23)
CO2: 27 mmol/L (ref 22–32)
CREATININE: 1.04 mg/dL — AB (ref 0.44–1.00)
Calcium: 9.6 mg/dL (ref 8.9–10.3)
Chloride: 106 mmol/L (ref 98–111)
GFR calc Af Amer: 57 mL/min — ABNORMAL LOW (ref >60)
GFR calc non Af Amer: 49 mL/min — ABNORMAL LOW (ref >60)
Glucose, Bld: 108 mg/dL — ABNORMAL HIGH (ref 70–99)
POTASSIUM: 3.9 mmol/L (ref 3.5–5.1)
SODIUM: 141 mmol/L (ref 135–145)

## 2018-05-18 LAB — CBC
HEMATOCRIT: 39.6 % (ref 36.0–46.0)
Hemoglobin: 12.5 g/dL (ref 12.0–15.0)
MCH: 29 pg (ref 26.0–34.0)
MCHC: 31.6 g/dL (ref 30.0–36.0)
MCV: 91.9 fL (ref 78.0–100.0)
PLATELETS: 197 10*3/uL (ref 150–400)
RBC: 4.31 MIL/uL (ref 3.87–5.11)
RDW: 13.9 % (ref 11.5–15.5)
WBC: 9.6 10*3/uL (ref 4.0–10.5)

## 2018-05-18 LAB — I-STAT TROPONIN, ED: Troponin i, poc: 0.01 ng/mL (ref 0.00–0.08)

## 2018-05-18 MED ORDER — TRAMADOL HCL 50 MG PO TABS
50.0000 mg | ORAL_TABLET | Freq: Once | ORAL | Status: AC
  Administered 2018-05-18: 50 mg via ORAL
  Filled 2018-05-18: qty 1

## 2018-05-18 MED ORDER — TRAMADOL HCL 50 MG PO TABS: 50.0000 mg | ORAL_TABLET | Freq: Four times a day (QID) | ORAL | 0 refills | Status: DC | PRN

## 2018-05-18 NOTE — ED Triage Notes (Signed)
Seen one day ago in ED for chest pain diagnosed with a PE and discharged. Chest pain continued today although better states chest pain is a pressure 5/10. Currently taking Xeralto. Alert answering and following commands appropriate.

## 2018-05-18 NOTE — Discharge Instructions (Signed)
It was our pleasure to provide your ER care today - we hope that you feel better.  Take ultram as need for pain - no driving when taking.  Continue your blood thinner medication.  Use incentive spirometer, 10 full and complete deep breaths in every hour while awake.  Return to ER if worse, new symptoms, high fevers, increased trouble breathing, other concern.

## 2018-05-18 NOTE — ED Provider Notes (Signed)
[facility] EMERGENCY DEPARTMENT Provider Note   CSN: [id number] Arrival date & time: 05/18/18  1111     History   Chief Complaint Chief Complaint  Patient presents with  . Chest Pain    HPI Kathryn [name] is a 83 y.o. female.  Patient c/o left chest pain c/w the pain she had yesterday when diagnosed with PE. Pain has been constant since, dull, moderate, non radiating, worse w deep breath. Denies sob. Denies cough or fever. No exertional cp. Was seen in ED yesterday, had ct, dx w PE, and placed on anticoag therapy. Pt elected to return home. Pt took acetaminophen this AM but notes pain persists. No new pain, or acute/abrupt worsening of her pain.   The history is provided by the patient and a relative.  Chest Pain   Pertinent negatives include no abdominal pain, no back pain, no fever, no headaches and no shortness of breath.    Past Medical History:  Diagnosis Date  . COPD (chronic obstructive pulmonary disease) ([place])   . Osteopenia   . Thyroid disease     Patient Active Problem List   Diagnosis Date Noted  . Unilateral primary osteoarthritis, right knee 08/07/2016  . Chronic pain of right knee 08/07/2016    Past Surgical History:  Procedure Laterality Date  . APPENDECTOMY    . TONSILLECTOMY AND ADENOIDECTOMY    . TUBAL LIGATION       OB History   None      Home Medications    Prior to Admission medications   Medication Sig Start Date End Date Taking? Authorizing Provider  Calcium Carbonate-Vitamin D (CALTRATE 600+D PO) Take 1 tablet by mouth daily.     [provider]  levothyroxine (SYNTHROID, LEVOTHROID) 100 MCG tablet Take 100 mcg by mouth daily before breakfast.    [provider]  losartan (COZAAR) 50 MG tablet Take 50 mg by mouth daily. 03/13/18   [provider]  Rivaroxaban 15 & 20 MG TBPK Take as directed on package: Start with one 15mg  tablet by mouth twice a day with food. On Day 22, switch to one  20mg  tablet once a day with food. 05/17/18   Sherwood Gambler, MD    Family History Family History  Problem Relation Age of Onset  . Breast cancer Mother   . Breast cancer Daughter     Social History Social History   Tobacco Use  . Smoking status: Former Smoker  Substance Use Topics  . Alcohol use: Never    Frequency: Never  . Drug use: Never     Allergies   Patient has no known allergies.   Review of Systems Review of Systems  Constitutional: Negative for fever.  HENT: Negative for sore throat.   Eyes: Negative for redness.  Respiratory: Negative for shortness of breath.   Cardiovascular: Positive for chest pain.  Gastrointestinal: Negative for abdominal pain.  Genitourinary: Negative for flank pain.  Musculoskeletal: Negative for back pain.  Skin: Negative for rash.  Neurological: Negative for headaches.  Hematological: Does not bruise/bleed easily.  Psychiatric/Behavioral: Negative for confusion.     Physical Exam Updated Vital Signs BP 130/73   Pulse 73   Temp 98.2 F (36.8 C) (Oral)   Resp 20   Ht 1.651 m (5\' 5" )   Wt 72.6 kg   SpO2 95%   BMI 26.63 kg/m   Physical Exam  Constitutional: She appears well-developed and well-nourished.  HENT:  Head: Atraumatic.  Eyes: Conjunctivae are  normal. No scleral icterus.  Neck: Neck supple. No tracheal deviation present.  Cardiovascular: Normal rate, regular rhythm, normal heart sounds and intact distal pulses. Exam reveals no gallop and no friction rub.  No murmur heard. Pulmonary/Chest: Effort normal and breath sounds normal. No respiratory distress.  Abdominal: Soft. Normal appearance and bowel sounds are normal. She exhibits no distension. There is no tenderness.  Musculoskeletal: She exhibits no edema or tenderness.  Neurological: She is alert.  Skin: Skin is warm and dry. No rash noted.  Psychiatric: She has a normal mood and affect.  Nursing note and vitals reviewed.    ED Treatments / Results    Labs (all labs ordered are listed, but only abnormal results are displayed) Results for orders placed or performed during the hospital encounter of 37/34/28  Basic metabolic panel  Result Value Ref Range   Sodium 141 135 - 145 mmol/L   Potassium 3.9 3.5 - 5.1 mmol/L   Chloride 106 98 - 111 mmol/L   CO2 27 22 - 32 mmol/L   Glucose, Bld 108 (H) 70 - 99 mg/dL   BUN 12 8 - 23 mg/dL   Creatinine, Ser 1.04 (H) 0.44 - 1.00 mg/dL   Calcium 9.6 8.9 - 10.3 mg/dL   GFR calc non Af Amer 49 (L) >60 mL/min   GFR calc Af Amer 57 (L) >60 mL/min   Anion gap 8 5 - 15  CBC  Result Value Ref Range   WBC 9.6 4.0 - 10.5 K/uL   RBC 4.31 3.87 - 5.11 MIL/uL   Hemoglobin 12.5 12.0 - 15.0 g/dL   HCT 39.6 36.0 - 46.0 %   MCV 91.9 78.0 - 100.0 fL   MCH 29.0 26.0 - 34.0 pg   MCHC 31.6 30.0 - 36.0 g/dL   RDW 13.9 11.5 - 15.5 %   Platelets 197 150 - 400 K/uL  I-stat troponin, ED  Result Value Ref Range   Troponin i, poc 0.01 0.00 - 0.08 ng/mL   Comment 3           Dg Chest 2 View  Result Date: 05/18/2018 CLINICAL DATA:  Persistent chest pain.  Known pulmonary embolus. EXAM: CHEST - 2 VIEW COMPARISON:  Chest x-ray and CTA chest 05/17/2018. FINDINGS: The heart size is normal. Aortic atherosclerosis is again seen. Lungs are hyperexpanded. Small effusions are stable. Minimal bibasilar atelectasis is present. No new airspace disease or collapse is present. The visualized soft tissues and bony thorax are unremarkable. IMPRESSION: 1. No significant interval change. 2. Stable small pleural effusions and associated atelectasis. 3. Changes of COPD. 4. Aortic atherosclerosis. Electronically Signed   By: San Morelle M.D.   On: 05/18/2018 12:16   Dg Chest 2 View  Result Date: 05/17/2018 CLINICAL DATA:  Left chest pain beginning yesterday. History of COPD. EXAM: CHEST - 2 VIEW COMPARISON:  PA and lateral chest 03/27/2009 and 04/10/2010. FINDINGS: The chest is hyperexpanded with attenuation of the pulmonary  vasculature. Trace bilateral pleural effusions are seen. Lungs are clear. Heart size is upper normal. Aortic atherosclerosis is noted. No acute or focal abnormality. IMPRESSION: A trace bilateral pleural effusions. Emphysema. Atherosclerosis. Electronically Signed   By: Inge Rise M.D.   On: 05/17/2018 11:16   Ct Angio Chest Pe W/cm &/or Wo Cm  Result Date: 05/17/2018 CLINICAL DATA:  Chest pain and shortness of breath EXAM: CT ANGIOGRAPHY CHEST WITH CONTRAST TECHNIQUE: Multidetector CT imaging of the chest was performed using the standard protocol during bolus administration of intravenous  contrast. Multiplanar CT image reconstructions and MIPs were obtained to evaluate the vascular anatomy. CONTRAST:  136mL ISOVUE-370 IOPAMIDOL (ISOVUE-370) INJECTION 76%, COMPARISON:  Chest radiograph 05/17/2018 FINDINGS: Cardiovascular: --Pulmonary arteries: Contrast injection is sufficient to demonstrate satisfactory opacification of the pulmonary arteries to the segmental level.There is a embolus within the lingular artery extending into the superior and inferior lingular segmental arteries. No other filling defect. The main pulmonary artery is enlarged, measuring 3.7 cm. There is no evidence of right heart strain. Opacity at the medial anterior aspect of the lingula may indicate early pulmonary infarction. --Aorta: Limited opacification of the aorta due to bolus timing optimization for the pulmonary arteries. Conventional 3 vessel aortic branching pattern. The aortic course and caliber are normal. There is mild aortic atherosclerosis. --Heart: The heart size is normal. No pericardial effusion. There are coronary artery calcifications. Mediastinum/Nodes: No mediastinal, hilar or axillary lymphadenopathy. The visualized thyroid and thoracic esophageal course are unremarkable. Lungs/Pleura: No pulmonary nodules or masses. No pleural effusion or pneumothorax. No focal airspace consolidation. No focal pleural abnormality.  Upper Abdomen: Contrast bolus timing is not optimized for evaluation of the abdominal organs. Within this limitation, the visualized organs of the upper abdomen are normal. Musculoskeletal: No chest wall abnormality. No acute or significant osseous findings. Review of the MIP images confirms the above findings. IMPRESSION: 1. Small pulmonary embolus within the lingular artery extending into the superior and inferior lingular segmental arteries. No right heart strain. Opacity at the medial anterior aspect of the lingula may indicate early infarction. 2. Coronary artery and aortic atherosclerosis (ICD10-I70.0). 3. Enlarged main pulmonary artery may indicate underlying pulmonary hypertension. Critical Value/emergent results were called by telephone at the time of interpretation on 05/17/2018 at 2:40 pm to Dr. Sherwood Gambler , who verbally acknowledged these results. Electronically Signed   By: Ulyses Jarred M.D.   On: 05/17/2018 14:48    EKG EKG Interpretation  Date/Time:  Wednesday May 18 2018 11:24:04 EDT Ventricular Rate:  78 PR Interval:  148 QRS Duration: 72 QT Interval:  384 QTC Calculation: 437 R Axis:   52 Text Interpretation:  Sinus rhythm with Premature atrial complexes Nonspecific T wave abnormality No significant change since last tracing Confirmed by Lajean Saver [id number]) on 05/18/2018 2:59:13 PM   Radiology Dg Chest 2 View  Result Date: 05/18/2018 CLINICAL DATA:  Persistent chest pain.  Known pulmonary embolus. EXAM: CHEST - 2 VIEW COMPARISON:  Chest x-ray and CTA chest 05/17/2018. FINDINGS: The heart size is normal. Aortic atherosclerosis is again seen. Lungs are hyperexpanded. Small effusions are stable. Minimal bibasilar atelectasis is present. No new airspace disease or collapse is present. The visualized soft tissues and bony thorax are unremarkable. IMPRESSION: 1. No significant interval change. 2. Stable small pleural effusions and associated atelectasis. 3. Changes of COPD. 4.  Aortic atherosclerosis. Electronically Signed   By: San Morelle M.D.   On: 05/18/2018 12:16   Dg Chest 2 View  Result Date: 05/17/2018 CLINICAL DATA:  Left chest pain beginning yesterday. History of COPD. EXAM: CHEST - 2 VIEW COMPARISON:  PA and lateral chest 03/27/2009 and 04/10/2010. FINDINGS: The chest is hyperexpanded with attenuation of the pulmonary vasculature. Trace bilateral pleural effusions are seen. Lungs are clear. Heart size is upper normal. Aortic atherosclerosis is noted. No acute or focal abnormality. IMPRESSION: A trace bilateral pleural effusions. Emphysema. Atherosclerosis. Electronically Signed   By: Inge Rise M.D.   On: 05/17/2018 11:16   Ct Angio Chest Pe W/cm &/or Wo Cm  Result Date:  05/17/2018 CLINICAL DATA:  Chest pain and shortness of breath EXAM: CT ANGIOGRAPHY CHEST WITH CONTRAST TECHNIQUE: Multidetector CT imaging of the chest was performed using the standard protocol during bolus administration of intravenous contrast. Multiplanar CT image reconstructions and MIPs were obtained to evaluate the vascular anatomy. CONTRAST:  136mL ISOVUE-370 IOPAMIDOL (ISOVUE-370) INJECTION 76%, COMPARISON:  Chest radiograph 05/17/2018 FINDINGS: Cardiovascular: --Pulmonary arteries: Contrast injection is sufficient to demonstrate satisfactory opacification of the pulmonary arteries to the segmental level.There is a embolus within the lingular artery extending into the superior and inferior lingular segmental arteries. No other filling defect. The main pulmonary artery is enlarged, measuring 3.7 cm. There is no evidence of right heart strain. Opacity at the medial anterior aspect of the lingula may indicate early pulmonary infarction. --Aorta: Limited opacification of the aorta due to bolus timing optimization for the pulmonary arteries. Conventional 3 vessel aortic branching pattern. The aortic course and caliber are normal. There is mild aortic atherosclerosis. --Heart: The heart  size is normal. No pericardial effusion. There are coronary artery calcifications. Mediastinum/Nodes: No mediastinal, hilar or axillary lymphadenopathy. The visualized thyroid and thoracic esophageal course are unremarkable. Lungs/Pleura: No pulmonary nodules or masses. No pleural effusion or pneumothorax. No focal airspace consolidation. No focal pleural abnormality. Upper Abdomen: Contrast bolus timing is not optimized for evaluation of the abdominal organs. Within this limitation, the visualized organs of the upper abdomen are normal. Musculoskeletal: No chest wall abnormality. No acute or significant osseous findings. Review of the MIP images confirms the above findings. IMPRESSION: 1. Small pulmonary embolus within the lingular artery extending into the superior and inferior lingular segmental arteries. No right heart strain. Opacity at the medial anterior aspect of the lingula may indicate early infarction. 2. Coronary artery and aortic atherosclerosis (ICD10-I70.0). 3. Enlarged main pulmonary artery may indicate underlying pulmonary hypertension. Critical Value/emergent results were called by telephone at the time of interpretation on 05/17/2018 at 2:40 pm to Dr. Sherwood Gambler , who verbally acknowledged these results. Electronically Signed   By: Ulyses Jarred M.D.   On: 05/17/2018 14:48    Procedures Procedures (including critical care time)  Medications Ordered in ED Medications  traMADol (ULTRAM) tablet 50 mg (has no administration in time range)     Initial Impression / Assessment and Plan / ED Course  I have reviewed the triage vital signs and the nursing notes.  Pertinent labs & imaging results that were available during my care of the patient were reviewed by me and considered in my medical decision making (see chart for details).  Reviewed nursing notes and prior charts for additional history. Reviewed charts/ct - pt with PE dx yesterday in area of her pain.   Ultram  po.  Discussed dx, pain, tx, etc with pt - pt is breathing comfortably. Pulse ox is 95-97% on room air.   Recheck, vital signs remain normal. No increased wob. Pt comfortable.   Pt indicates prefers to go home.  Pt currently appears stable for d/c.   Will give incentive spirometer for home, and rx for pain.   Final Clinical Impressions(s) / ED Diagnoses   Final diagnoses:  None    ED Discharge Orders    None       Lajean Saver, MD 05/18/18 1459

## 2018-05-31 DIAGNOSIS — H40053 Ocular hypertension, bilateral: Secondary | ICD-10-CM | POA: Diagnosis not present

## 2018-06-14 DIAGNOSIS — I2699 Other pulmonary embolism without acute cor pulmonale: Secondary | ICD-10-CM | POA: Diagnosis not present

## 2018-06-14 DIAGNOSIS — M179 Osteoarthritis of knee, unspecified: Secondary | ICD-10-CM | POA: Diagnosis not present

## 2018-06-14 DIAGNOSIS — Z23 Encounter for immunization: Secondary | ICD-10-CM | POA: Diagnosis not present

## 2018-06-15 ENCOUNTER — Other Ambulatory Visit: Payer: Self-pay | Admitting: Family Medicine

## 2018-06-15 DIAGNOSIS — Z1231 Encounter for screening mammogram for malignant neoplasm of breast: Secondary | ICD-10-CM

## 2018-07-12 ENCOUNTER — Ambulatory Visit
Admission: RE | Admit: 2018-07-12 | Discharge: 2018-07-12 | Disposition: A | Discharge status: Home / Self Care | Payer: Medicare Other | Source: Ambulatory Visit | Attending: Family Medicine | Admitting: Family Medicine

## 2018-07-12 DIAGNOSIS — Z1231 Encounter for screening mammogram for malignant neoplasm of breast: Secondary | ICD-10-CM

## 2018-08-31 DIAGNOSIS — M899 Disorder of bone, unspecified: Secondary | ICD-10-CM | POA: Diagnosis not present

## 2018-08-31 DIAGNOSIS — I10 Essential (primary) hypertension: Secondary | ICD-10-CM | POA: Diagnosis not present

## 2018-08-31 DIAGNOSIS — Z13220 Encounter for screening for lipoid disorders: Secondary | ICD-10-CM | POA: Diagnosis not present

## 2018-08-31 DIAGNOSIS — M4692 Unspecified inflammatory spondylopathy, cervical region: Secondary | ICD-10-CM | POA: Diagnosis not present

## 2018-08-31 DIAGNOSIS — J449 Chronic obstructive pulmonary disease, unspecified: Secondary | ICD-10-CM | POA: Diagnosis not present

## 2018-08-31 DIAGNOSIS — G4700 Insomnia, unspecified: Secondary | ICD-10-CM | POA: Diagnosis not present

## 2018-08-31 DIAGNOSIS — E039 Hypothyroidism, unspecified: Secondary | ICD-10-CM | POA: Diagnosis not present

## 2018-08-31 DIAGNOSIS — M179 Osteoarthritis of knee, unspecified: Secondary | ICD-10-CM | POA: Diagnosis not present

## 2018-08-31 DIAGNOSIS — Z0000 Encounter for general adult medical examination without abnormal findings: Secondary | ICD-10-CM | POA: Diagnosis not present

## 2018-08-31 DIAGNOSIS — I2699 Other pulmonary embolism without acute cor pulmonale: Secondary | ICD-10-CM | POA: Diagnosis not present

## 2018-08-31 DIAGNOSIS — Z23 Encounter for immunization: Secondary | ICD-10-CM | POA: Diagnosis not present

## 2018-10-03 DIAGNOSIS — L570 Actinic keratosis: Secondary | ICD-10-CM | POA: Diagnosis not present

## 2018-10-03 DIAGNOSIS — L821 Other seborrheic keratosis: Secondary | ICD-10-CM | POA: Diagnosis not present

## 2018-10-03 DIAGNOSIS — Z85828 Personal history of other malignant neoplasm of skin: Secondary | ICD-10-CM | POA: Diagnosis not present

## 2018-12-06 ENCOUNTER — Ambulatory Visit (INDEPENDENT_AMBULATORY_CARE_PROVIDER_SITE_OTHER): Payer: Medicare Other

## 2018-12-06 ENCOUNTER — Encounter (INDEPENDENT_AMBULATORY_CARE_PROVIDER_SITE_OTHER): Payer: Self-pay | Admitting: Orthopaedic Surgery

## 2018-12-06 ENCOUNTER — Ambulatory Visit (INDEPENDENT_AMBULATORY_CARE_PROVIDER_SITE_OTHER): Payer: Medicare Other | Admitting: Orthopaedic Surgery

## 2018-12-06 ENCOUNTER — Other Ambulatory Visit: Payer: Self-pay

## 2018-12-06 DIAGNOSIS — M25561 Pain in right knee: Secondary | ICD-10-CM

## 2018-12-06 DIAGNOSIS — M542 Cervicalgia: Secondary | ICD-10-CM

## 2018-12-06 DIAGNOSIS — G8929 Other chronic pain: Secondary | ICD-10-CM

## 2018-12-06 HISTORY — DX: Cervicalgia: M54.2

## 2018-12-06 MED ORDER — METHYLPREDNISOLONE ACETATE 40 MG/ML IJ SUSP
40.0000 mg | INTRAMUSCULAR | Status: AC | PRN
  Administered 2018-12-06: 40 mg via INTRA_ARTICULAR

## 2018-12-06 MED ORDER — LIDOCAINE HCL 1 % IJ SOLN
2.0000 mL | INTRAMUSCULAR | Status: AC | PRN
  Administered 2018-12-06: 2 mL

## 2018-12-06 MED ORDER — BUPIVACAINE HCL 0.25 % IJ SOLN
2.0000 mL | INTRAMUSCULAR | Status: AC | PRN
  Administered 2018-12-06: 2 mL via INTRA_ARTICULAR

## 2018-12-06 MED ORDER — METHYLPREDNISOLONE 4 MG PO TBPK: ORAL_TABLET | ORAL | 0 refills | Status: DC

## 2018-12-06 NOTE — Progress Notes (Signed)
Office Visit Note   Patient: Kathryn [name]           Date of Birth: 13-Aug-1935           MRN: [id number] Visit Date: 12/06/2018              Requested by: Mayra Neer, MD [address] [place], [place] [address] PCP: Mayra Neer, MD   Assessment & Plan: Visit Diagnoses:  1. Chronic pain of right knee   2. Neck pain     Plan: Impression is cervical spine spondylosis and end-stage generative joint disease right knee.  In regards to her neck, we will call in a Sterapred taper.  She does not want to go to formal physical therapy at this point.  In regards to her right knee, we will inject this with cortisone today.  I did also provide her with a viscosupplementation handout.  She will follow-up with [place] as needed.  Follow-Up Instructions: Return if symptoms worsen or fail to improve.   Orders:  Orders Placed This Encounter  Procedures  . Large Joint Inj: R knee  . XR Cervical Spine 2 or 3 views  . XR KNEE 3 VIEW RIGHT   Meds ordered this encounter  Medications  . methylPREDNISolone (MEDROL DOSEPAK) 4 MG TBPK tablet    Sig: Take as directed    Dispense:  21 tablet    Refill:  0      Procedures: Large Joint Inj: R knee on 12/06/2018 2:35 PM Indications: pain Details: 22 G needle, anterolateral approach Medications: 2 mL bupivacaine 0.25 %; 2 mL lidocaine 1 %; 40 mg methylPREDNISolone acetate 40 MG/ML      Clinical Data: No additional findings.   Subjective: Chief Complaint  Patient presents with  . Right Knee - Pain    HPI patient is a pleasant 84 year old female who presents our clinic today with neck and right knee pain.  In regards to her neck, this is been ongoing for the past few years.  No known injury or change in activity.  The majority of her pain is to the right side of her neck.  There is no radiation into her parascapular region or down either arm.  The pain is worse when turning her head to the right when she is trying to  sleep at night as she has a hard time getting into a comfortable position.  She denies any upper extremity weakness.  No numbness, tingling or burning.  She has not had any formal physical therapy, ESI or surgical intervention to the neck.  The other issue is her right knee.  She has had intermittent pain for the past few years.  Cortisone injection 2017 which significantly helped until recently.  Her pain has returned and has started to worsen.  The pain she has the medial aspect.  She describes as a deep ache worse with ambulation as well as going from a seated to standing position.  She does occasionally get pain at night.  She has taken Tylenol with mild relief of symptoms.  She does note she is unable to take anti-inflammatories due to being on Xarelto for a PE.  Review of Systems as detailed in HPI.  All others reviewed and are negative.   Objective: Vital Signs: There were no vitals taken for this visit.  Physical Exam well-developed well-nourished female in no acute distress.  Alert and oriented x3  Ortho Exam examination of her neck reveals increased pain with extension.  No pain with flexion.  Pain with lateral rotation to the right.  No spinous tenderness.  She has slight paraspinous tenderness on the right.  Examination of her right knee shows a trace effusion.  Range of motion 0 to 125 degrees.  Mild patellofemoral crepitus.  Marked tenderness medial joint line.  Stable vascular stress.  She is neurovascular intact distally.  Specialty Comments:  No specialty comments available.  Imaging: Xr Cervical Spine 2 Or 3 Views  Result Date: 12/06/2018 X-rays demonstrate significant diffuse degenerative disc disease  Xr Knee 3 View Right  Result Date: 12/06/2018 X-rays demonstrate marked degenerative changes medial and patellofemoral compartments    PMFS History: Patient Active Problem List   Diagnosis Date Noted  . Neck pain 12/06/2018  . Unilateral primary osteoarthritis, right  knee 08/07/2016  . Chronic pain of right knee 08/07/2016   Past Medical History:  Diagnosis Date  . COPD (chronic obstructive pulmonary disease) ([place])   . Osteopenia   . Thyroid disease     Family History  Problem Relation Age of Onset  . Breast cancer Mother   . Breast cancer Daughter 16    Past Surgical History:  Procedure Laterality Date  . APPENDECTOMY    . TONSILLECTOMY AND ADENOIDECTOMY    . TUBAL LIGATION     Social History   Occupational History  . Not on file  Tobacco Use  . Smoking status: Former Research scientist (life sciences)  . Smokeless tobacco: Never Used  Substance and Sexual Activity  . Alcohol use: Never    Frequency: Never  . Drug use: Never  . Sexual activity: Not on file

## 2019-02-23 ENCOUNTER — Other Ambulatory Visit: Payer: Self-pay | Admitting: Family Medicine

## 2019-02-23 DIAGNOSIS — Z7189 Other specified counseling: Secondary | ICD-10-CM | POA: Diagnosis not present

## 2019-02-23 DIAGNOSIS — M179 Osteoarthritis of knee, unspecified: Secondary | ICD-10-CM | POA: Diagnosis not present

## 2019-02-23 DIAGNOSIS — R2242 Localized swelling, mass and lump, left lower limb: Secondary | ICD-10-CM

## 2019-02-23 DIAGNOSIS — M7989 Other specified soft tissue disorders: Secondary | ICD-10-CM

## 2019-02-23 DIAGNOSIS — I2699 Other pulmonary embolism without acute cor pulmonale: Secondary | ICD-10-CM | POA: Diagnosis not present

## 2019-03-01 ENCOUNTER — Encounter: Payer: Self-pay | Admitting: Orthopaedic Surgery

## 2019-03-01 ENCOUNTER — Ambulatory Visit (INDEPENDENT_AMBULATORY_CARE_PROVIDER_SITE_OTHER): Payer: Medicare Other | Admitting: Orthopaedic Surgery

## 2019-03-01 ENCOUNTER — Telehealth: Payer: Self-pay | Admitting: Radiology

## 2019-03-01 ENCOUNTER — Other Ambulatory Visit: Payer: Self-pay

## 2019-03-01 DIAGNOSIS — M1711 Unilateral primary osteoarthritis, right knee: Secondary | ICD-10-CM

## 2019-03-01 MED ORDER — METHYLPREDNISOLONE ACETATE 40 MG/ML IJ SUSP
40.0000 mg | INTRAMUSCULAR | Status: AC | PRN
  Administered 2019-03-01: 40 mg via INTRA_ARTICULAR

## 2019-03-01 MED ORDER — BUPIVACAINE HCL 0.5 % IJ SOLN
2.0000 mL | INTRAMUSCULAR | Status: AC | PRN
  Administered 2019-03-01: 2 mL via INTRA_ARTICULAR

## 2019-03-01 MED ORDER — LIDOCAINE HCL 1 % IJ SOLN
2.0000 mL | INTRAMUSCULAR | Status: AC | PRN
  Administered 2019-03-01: 2 mL

## 2019-03-01 NOTE — Progress Notes (Signed)
   Office Visit Note   Patient: Kathryn [name]           Date of Birth: 1935-09-15           MRN: [id number] Visit Date: 03/01/2019              Requested by: Mayra Neer, MD [address] [place], [place] [address] PCP: Mayra Neer, MD   Assessment & Plan: Visit Diagnoses:  1. Unilateral primary osteoarthritis, right knee     Plan: Impression is ongoing right knee degenerative joint disease with no significant relief from previous cortisone injection.  We discussed trying another injection today to see if this will give her better relief through a superior lateral approach.  We will also obtain prior authorization for Visco supplementation.  Hopefully this cortisone injection will help more than the previous one.  Follow-Up Instructions: Return if symptoms worsen or fail to improve.   Orders:  No orders of the defined types were placed in this encounter.  No orders of the defined types were placed in this encounter.     Procedures: Large Joint Inj: R knee on 03/01/2019 4:13 PM Indications: pain Details: 22 G needle  Arthrogram: No  Medications: 40 mg methylPREDNISolone acetate 40 MG/ML; 2 mL lidocaine 1 %; 2 mL bupivacaine 0.5 % Consent was given by the patient. Patient was prepped and draped in the usual sterile fashion.       Clinical Data: No additional findings.   Subjective: Chief Complaint  Patient presents with  . Right Knee - Pain    No relief with injection from 12/06/2018    [place] returns today for follow-up of right knee pain.  Previous injection did not give her significant relief she would like to try another cortisone injection today.  The previous injection was done on March 17 which was approximately 3 months ago.  She is interested in trying viscosupplementation injections.   Review of Systems   Objective: Vital Signs: There were no vitals taken for this visit.  Physical Exam  Ortho Exam Right knee exam  shows no joint effusion.  Normal range of motion.  Worse pain on the medial side. Specialty Comments:  No specialty comments available.  Imaging: No results found.   PMFS History: Patient Active Problem List   Diagnosis Date Noted  . Neck pain 12/06/2018  . Unilateral primary osteoarthritis, right knee 08/07/2016  . Chronic pain of right knee 08/07/2016   Past Medical History:  Diagnosis Date  . COPD (chronic obstructive pulmonary disease) ([place])   . Osteopenia   . Thyroid disease     Family History  Problem Relation Age of Onset  . Breast cancer Mother   . Breast cancer Daughter 77    Past Surgical History:  Procedure Laterality Date  . APPENDECTOMY    . TONSILLECTOMY AND ADENOIDECTOMY    . TUBAL LIGATION     Social History   Occupational History  . Not on file  Tobacco Use  . Smoking status: Former Research scientist (life sciences)  . Smokeless tobacco: Never Used  Substance and Sexual Activity  . Alcohol use: Never    Frequency: Never  . Drug use: Never  . Sexual activity: Not on file

## 2019-03-01 NOTE — Telephone Encounter (Signed)
Please get approval for gel injection in the right knee

## 2019-03-03 ENCOUNTER — Telehealth: Payer: Self-pay

## 2019-03-03 NOTE — Telephone Encounter (Signed)
Noted  

## 2019-03-03 NOTE — Telephone Encounter (Signed)
Submitted VOB for SynviscOne, right knee. 

## 2019-03-06 ENCOUNTER — Ambulatory Visit
Admission: RE | Admit: 2019-03-06 | Discharge: 2019-03-06 | Disposition: A | Discharge status: Home / Self Care | Payer: Medicare Other | Source: Ambulatory Visit | Attending: Family Medicine | Admitting: Family Medicine

## 2019-03-06 ENCOUNTER — Telehealth: Payer: Self-pay

## 2019-03-06 DIAGNOSIS — R2242 Localized swelling, mass and lump, left lower limb: Secondary | ICD-10-CM

## 2019-03-06 DIAGNOSIS — M7989 Other specified soft tissue disorders: Secondary | ICD-10-CM

## 2019-03-06 DIAGNOSIS — R600 Localized edema: Secondary | ICD-10-CM | POA: Diagnosis not present

## 2019-03-06 NOTE — Telephone Encounter (Signed)
Left a VM for patient to CB to schedule an appointment with Dr. Erlinda Hong for gel injection.  Approved for SynviscOne, right knee. Buy & Bill Covered at 100% through [place], after [employer]. No Co-pay No PA required

## 2019-04-04 ENCOUNTER — Encounter: Payer: Self-pay | Admitting: Orthopaedic Surgery

## 2019-04-04 ENCOUNTER — Ambulatory Visit (INDEPENDENT_AMBULATORY_CARE_PROVIDER_SITE_OTHER): Payer: Medicare Other | Admitting: Orthopaedic Surgery

## 2019-04-04 ENCOUNTER — Other Ambulatory Visit: Payer: Self-pay

## 2019-04-04 DIAGNOSIS — M1711 Unilateral primary osteoarthritis, right knee: Secondary | ICD-10-CM

## 2019-04-04 MED ORDER — LIDOCAINE HCL 1 % IJ SOLN
2.0000 mL | INTRAMUSCULAR | Status: AC | PRN
  Administered 2019-04-04: 2 mL

## 2019-04-04 MED ORDER — HYLAN G-F 20 48 MG/6ML IX SOSY
48.0000 mg | PREFILLED_SYRINGE | INTRA_ARTICULAR | Status: AC | PRN
  Administered 2019-04-04: 48 mg via INTRA_ARTICULAR

## 2019-04-04 MED ORDER — BUPIVACAINE HCL 0.25 % IJ SOLN
2.0000 mL | INTRAMUSCULAR | Status: AC | PRN
  Administered 2019-04-04: 10:00:00 2 mL via INTRA_ARTICULAR

## 2019-04-04 NOTE — Progress Notes (Signed)
   Procedure Note  Patient: Kathryn [name]             Date of Birth: 1934/11/02           MRN: [id number]             Visit Date: 04/04/2019  Procedures: Visit Diagnoses:  1. Unilateral primary osteoarthritis, right knee     Large Joint Inj: R knee on 04/04/2019 10:15 AM Indications: pain Details: 22 G needle, anterolateral approach Medications: 2 mL bupivacaine 0.25 %; 48 mg Hylan 48 MG/6ML; 2 mL lidocaine 1 %

## 2019-04-19 DIAGNOSIS — E039 Hypothyroidism, unspecified: Secondary | ICD-10-CM | POA: Diagnosis not present

## 2019-04-19 DIAGNOSIS — R609 Edema, unspecified: Secondary | ICD-10-CM | POA: Diagnosis not present

## 2019-05-24 ENCOUNTER — Other Ambulatory Visit: Payer: Self-pay | Admitting: Family Medicine

## 2019-05-24 ENCOUNTER — Ambulatory Visit
Admission: RE | Admit: 2019-05-24 | Discharge: 2019-05-24 | Disposition: A | Discharge status: Home / Self Care | Payer: Medicare Other | Source: Ambulatory Visit | Attending: Family Medicine | Admitting: Family Medicine

## 2019-05-24 DIAGNOSIS — Z86711 Personal history of pulmonary embolism: Secondary | ICD-10-CM | POA: Diagnosis not present

## 2019-05-24 DIAGNOSIS — R0989 Other specified symptoms and signs involving the circulatory and respiratory systems: Secondary | ICD-10-CM

## 2019-05-24 DIAGNOSIS — R600 Localized edema: Secondary | ICD-10-CM

## 2019-05-24 DIAGNOSIS — R0602 Shortness of breath: Secondary | ICD-10-CM | POA: Diagnosis not present

## 2019-05-25 ENCOUNTER — Other Ambulatory Visit: Payer: Self-pay | Admitting: Family Medicine

## 2019-05-25 DIAGNOSIS — R911 Solitary pulmonary nodule: Secondary | ICD-10-CM

## 2019-06-01 ENCOUNTER — Ambulatory Visit
Admission: RE | Admit: 2019-06-01 | Discharge: 2019-06-01 | Disposition: A | Discharge status: Home / Self Care | Payer: Medicare Other | Source: Ambulatory Visit | Attending: Family Medicine | Admitting: Family Medicine

## 2019-06-01 DIAGNOSIS — J9811 Atelectasis: Secondary | ICD-10-CM | POA: Diagnosis not present

## 2019-06-01 DIAGNOSIS — R918 Other nonspecific abnormal finding of lung field: Secondary | ICD-10-CM | POA: Diagnosis not present

## 2019-06-01 DIAGNOSIS — R911 Solitary pulmonary nodule: Secondary | ICD-10-CM

## 2019-06-01 MED ORDER — IOPAMIDOL (ISOVUE-300) INJECTION 61%
75.0000 mL | Freq: Once | INTRAVENOUS | Status: AC | PRN
  Administered 2019-06-01: 14:00:00 75 mL via INTRAVENOUS

## 2019-07-17 ENCOUNTER — Other Ambulatory Visit: Payer: Self-pay | Admitting: Family Medicine

## 2019-07-17 DIAGNOSIS — Z1231 Encounter for screening mammogram for malignant neoplasm of breast: Secondary | ICD-10-CM

## 2019-08-30 ENCOUNTER — Ambulatory Visit
Admission: RE | Admit: 2019-08-30 | Discharge: 2019-08-30 | Disposition: A | Discharge status: Home / Self Care | Payer: Medicare Other | Source: Ambulatory Visit | Attending: Family Medicine | Admitting: Family Medicine

## 2019-08-30 ENCOUNTER — Other Ambulatory Visit: Payer: Self-pay

## 2019-08-30 DIAGNOSIS — Z1231 Encounter for screening mammogram for malignant neoplasm of breast: Secondary | ICD-10-CM | POA: Diagnosis not present

## 2019-09-06 ENCOUNTER — Ambulatory Visit: Payer: Medicare Other

## 2019-10-05 DIAGNOSIS — Z131 Encounter for screening for diabetes mellitus: Secondary | ICD-10-CM | POA: Diagnosis not present

## 2019-10-05 DIAGNOSIS — Z13220 Encounter for screening for lipoid disorders: Secondary | ICD-10-CM | POA: Diagnosis not present

## 2019-10-05 DIAGNOSIS — Z114 Encounter for screening for human immunodeficiency virus [HIV]: Secondary | ICD-10-CM | POA: Diagnosis not present

## 2019-10-05 DIAGNOSIS — M25472 Effusion, left ankle: Secondary | ICD-10-CM | POA: Diagnosis not present

## 2019-10-05 DIAGNOSIS — Z0000 Encounter for general adult medical examination without abnormal findings: Secondary | ICD-10-CM | POA: Diagnosis not present

## 2019-10-05 DIAGNOSIS — E039 Hypothyroidism, unspecified: Secondary | ICD-10-CM | POA: Diagnosis not present

## 2019-10-19 DIAGNOSIS — I4510 Unspecified right bundle-branch block: Secondary | ICD-10-CM | POA: Diagnosis not present

## 2019-10-19 DIAGNOSIS — N183 Chronic kidney disease, stage 3 unspecified: Secondary | ICD-10-CM | POA: Diagnosis not present

## 2019-10-19 DIAGNOSIS — M25472 Effusion, left ankle: Secondary | ICD-10-CM | POA: Diagnosis not present

## 2019-10-25 DIAGNOSIS — R9431 Abnormal electrocardiogram [ECG] [EKG]: Secondary | ICD-10-CM | POA: Diagnosis not present

## 2019-10-25 DIAGNOSIS — I4510 Unspecified right bundle-branch block: Secondary | ICD-10-CM | POA: Diagnosis not present

## 2019-11-01 DIAGNOSIS — I4510 Unspecified right bundle-branch block: Secondary | ICD-10-CM | POA: Diagnosis not present

## 2019-11-14 ENCOUNTER — Other Ambulatory Visit: Payer: Self-pay

## 2019-11-14 ENCOUNTER — Ambulatory Visit (INDEPENDENT_AMBULATORY_CARE_PROVIDER_SITE_OTHER): Payer: Medicare Other | Admitting: Orthopaedic Surgery

## 2019-11-14 ENCOUNTER — Encounter: Payer: Self-pay | Admitting: Orthopaedic Surgery

## 2019-11-14 VITALS — Ht 65.0 in | Wt 165.0 lb

## 2019-11-14 DIAGNOSIS — M1710 Unilateral primary osteoarthritis, unspecified knee: Secondary | ICD-10-CM | POA: Insufficient documentation

## 2019-11-14 DIAGNOSIS — M1711 Unilateral primary osteoarthritis, right knee: Secondary | ICD-10-CM

## 2019-11-14 DIAGNOSIS — M179 Osteoarthritis of knee, unspecified: Secondary | ICD-10-CM | POA: Insufficient documentation

## 2019-11-14 NOTE — Progress Notes (Signed)
Office Visit Note   Patient: Kathryn [name]           Date of Birth: 1934/09/28           MRN: [id number] Visit Date: 11/14/2019              Requested by: Mayra Neer, MD [address] [place],  [place] [address] PCP: Mayra Neer, MD   Assessment & Plan: Visit Diagnoses:  1. Primary osteoarthritis of right knee     Plan: Impression is advanced to right knee DJD.  Unfortunately patient has exhausted all conservative treatment without any substantial pain relief.  We had a lengthy discussion today on the risk benefits alternatives to knee replacement surgery and reasonable likelihood for rehab and recovery.  All questions answered today.  She will call us back if she feels that she would be interested in pursuing a knee replacement.  She lives with her daughter and can stay downstairs if needed in their extra bedroom. Total face to face encounter time was greater than 25 minutes and over half of this time was spent in counseling and/or coordination of care.  Follow-Up Instructions: Return if symptoms worsen or fail to improve.   Orders:  No orders of the defined types were placed in this encounter.  No orders of the defined types were placed in this encounter.     Procedures: No procedures performed   Clinical Data: No additional findings.   Subjective: Chief Complaint  Patient presents with  . Right Knee - Pain    [name] returns today for follow-up of continued right knee pain secondary to her advanced DJD.  She continues to have constant severe pain on the medial side of her knee that is worse at night.  She is having trouble sleeping.  So far medications, activity modifications, injections have not provided her with any substantial pain relief.  She is interested in exploring other options.   Review of Systems  Constitutional: Negative.   HENT: Negative.   Eyes: Negative.   Respiratory: Negative.   Cardiovascular: Negative.     Endocrine: Negative.   Musculoskeletal: Negative.   Neurological: Negative.   Hematological: Negative.   Psychiatric/Behavioral: Negative.   All other systems reviewed and are negative.    Objective: Vital Signs: Ht 5\' 5"  (1.651 m)   Wt 165 lb (74.8 kg)   BMI 27.46 kg/m   Physical Exam Vitals and nursing note reviewed.  Constitutional:      Appearance: She is well-developed.  Pulmonary:     Effort: Pulmonary effort is normal.  Skin:    General: Skin is warm.     Capillary Refill: Capillary refill takes less than 2 seconds.  Neurological:     Mental Status: She is alert and oriented to person, place, and time.  Psychiatric:        Behavior: Behavior normal.        Thought Content: Thought content normal.        Judgment: Judgment normal.     Ortho Exam Right knee exam is unchanged.  She has a trace joint effusion.  Collaterals and cruciates are stable.  Mild limitation in range of motion with moderate pain. Specialty Comments:  No specialty comments available.  Imaging: No results found.   PMFS History: Patient Active Problem List   Diagnosis Date Noted  . Primary osteoarthritis of right knee 11/14/2019  . Neck pain 12/06/2018  . Chronic pain of right knee 08/07/2016   Past  Medical History:  Diagnosis Date  . COPD (chronic obstructive pulmonary disease) ([place])   . Osteopenia   . Thyroid disease     Family History  Problem Relation Age of Onset  . Breast cancer Mother   . Breast cancer Daughter 52    Past Surgical History:  Procedure Laterality Date  . APPENDECTOMY    . TONSILLECTOMY AND ADENOIDECTOMY    . TUBAL LIGATION     Social History   Occupational History  . Not on file  Tobacco Use  . Smoking status: Former Research scientist (life sciences)  . Smokeless tobacco: Never Used  Substance and Sexual Activity  . Alcohol use: Never  . Drug use: Never  . Sexual activity: Not on file

## 2019-11-19 ENCOUNTER — Other Ambulatory Visit: Payer: Self-pay

## 2019-11-19 ENCOUNTER — Ambulatory Visit: Payer: Medicare Other | Attending: Internal Medicine

## 2019-11-19 DIAGNOSIS — Z23 Encounter for immunization: Secondary | ICD-10-CM | POA: Insufficient documentation

## 2019-11-19 NOTE — Progress Notes (Signed)
   Covid-19 Vaccination Clinic  Name:  Kathryn [name]    MRN: [id number] DOB: 07-06-1935  11/19/2019  [name] was observed post Covid-19 immunization for 15 minutes without incidence. She was provided with Vaccine Information Sheet and instruction to access the V-Safe system.   [name] was instructed to call 911 with any severe reactions post vaccine: Marland Kitchen Difficulty breathing  . Swelling of your face and throat  . A fast heartbeat  . A bad rash all over your body  . Dizziness and weakness    Immunizations Administered    Name Date Dose VIS Date Route   Pfizer COVID-19 Vaccine 11/19/2019 10:11 AM 0.3 mL 09/01/2019 Intramuscular   Manufacturer: [place]   Lot: [id number]   [place]: [id number]

## 2019-11-23 DIAGNOSIS — R600 Localized edema: Secondary | ICD-10-CM | POA: Diagnosis not present

## 2019-11-23 DIAGNOSIS — M7732 Calcaneal spur, left foot: Secondary | ICD-10-CM | POA: Diagnosis not present

## 2019-11-23 DIAGNOSIS — M79672 Pain in left foot: Secondary | ICD-10-CM | POA: Diagnosis not present

## 2019-11-23 DIAGNOSIS — M722 Plantar fascial fibromatosis: Secondary | ICD-10-CM | POA: Diagnosis not present

## 2019-11-27 ENCOUNTER — Telehealth: Payer: Self-pay | Admitting: Orthopaedic Surgery

## 2019-11-27 NOTE — Telephone Encounter (Signed)
Yes please. Thanks. 

## 2019-11-27 NOTE — Telephone Encounter (Signed)
Pt called in stating she had an appt with Dr. Erlinda Hong on 11-14-19 and he told her to call when she was ready to get scheduled for her knee replaced. Pt would like a call back to get this scheduled.   [phone]

## 2019-11-27 NOTE — Telephone Encounter (Signed)
Please advise.   Ok to send to [employer] to schedule?

## 2019-11-27 NOTE — Telephone Encounter (Signed)
Patient is ready to schedule surgery

## 2019-12-01 NOTE — Telephone Encounter (Signed)
Spoke with daughter yesterday, she stated mother had recently went to different doctor for primary care since Dr. Brigitte Pulse has been out of the office.  Clearance request originally sent to Dr. Brigitte Pulse for Medical Clearance.  Resent clearance request to Dr. Carney Bern.  Will call patient to schedule once cleared medically for surgery.

## 2019-12-13 ENCOUNTER — Ambulatory Visit: Payer: Medicare Other | Attending: Internal Medicine

## 2019-12-13 DIAGNOSIS — Z23 Encounter for immunization: Secondary | ICD-10-CM

## 2019-12-13 NOTE — Progress Notes (Signed)
   Covid-19 Vaccination Clinic  Name:  Sabrea Hallum    MRN: [id number] DOB: July 22, 1935  12/13/2019  Ms. Deily was observed post Covid-19 immunization for 15 minutes without incident. She was provided with Vaccine Information Sheet and instruction to access the V-Safe system.   Ms. Theard was instructed to call 911 with any severe reactions post vaccine: Marland Kitchen Difficulty breathing  . Swelling of face and throat  . A fast heartbeat  . A bad rash all over body  . Dizziness and weakness   Immunizations Administered    Name Date Dose VIS Date Route   Pfizer COVID-19 Vaccine 12/13/2019  1:04 PM 0.3 mL 09/01/2019 Intramuscular   Manufacturer: [place]   Lot: [id number]   [place]: [id number]

## 2019-12-27 DIAGNOSIS — M4692 Unspecified inflammatory spondylopathy, cervical region: Secondary | ICD-10-CM | POA: Diagnosis not present

## 2019-12-27 DIAGNOSIS — Z86711 Personal history of pulmonary embolism: Secondary | ICD-10-CM | POA: Diagnosis not present

## 2019-12-27 DIAGNOSIS — E039 Hypothyroidism, unspecified: Secondary | ICD-10-CM | POA: Diagnosis not present

## 2019-12-27 DIAGNOSIS — J449 Chronic obstructive pulmonary disease, unspecified: Secondary | ICD-10-CM | POA: Diagnosis not present

## 2019-12-27 DIAGNOSIS — I10 Essential (primary) hypertension: Secondary | ICD-10-CM | POA: Diagnosis not present

## 2019-12-27 DIAGNOSIS — Z0000 Encounter for general adult medical examination without abnormal findings: Secondary | ICD-10-CM | POA: Diagnosis not present

## 2019-12-27 DIAGNOSIS — M899 Disorder of bone, unspecified: Secondary | ICD-10-CM | POA: Diagnosis not present

## 2019-12-27 DIAGNOSIS — M179 Osteoarthritis of knee, unspecified: Secondary | ICD-10-CM | POA: Diagnosis not present

## 2019-12-27 DIAGNOSIS — R609 Edema, unspecified: Secondary | ICD-10-CM | POA: Diagnosis not present

## 2019-12-27 NOTE — Progress Notes (Signed)
[facility] [place], [address] [place] [address] Phone: [phone] Fax: [phone]     Your procedure is scheduled on Monday, April 12th, from 11:15- 1:37 PM.  Report to [facility] Main Entrance "A" at 09:15 A.M., and check in at the Admitting office.  Call this number if you have problems the morning of surgery:  [phone]  Call [phone] if you have any questions prior to your surgery date Monday-Friday 8am-4pm.    Remember:  Do not eat after midnight the night before your surgery.  You may drink clear liquids until 08:15 AM the morning of your surgery.    Clear liquids allowed are: Water, Non-Citrus Juices (without pulp), Carbonated Beverages, Clear Tea, Black Coffee Only, and Gatorade.  Please complete your PRE-SURGERY ENSURE that was provided to you by 08:15 AM the morning of surgery.  Please, if able, drink it in one setting. DO NOT SIP.     Take these medicines the morning of surgery with A SIP OF WATER : levothyroxine (SYNTHROID, LEVOTHROID)  IF NEEDED: acetaminophen (TYLENOL)  As of today, STOP taking any Aspirin (unless otherwise instructed by your surgeon) and Aspirin containing products, Aleve, Naproxen, Ibuprofen, Motrin, Advil, Goody's, BC's, all herbal medications, fish oil, and all vitamins.                      Do not wear jewelry, make up, or nail polish.            Do not wear lotions, powders, perfumes, or deodorant.            Do not shave 48 hours prior to surgery.            Do not bring valuables to the hospital.            [facility] is not responsible for any belongings or valuables.  Do NOT Smoke (Tobacco/Vapping) or drink Alcohol 24 hours prior to your procedure If you use a CPAP at night, you may bring all equipment for your overnight stay.   Contacts, glasses, dentures or bridgework may not be worn into surgery.      For patients  admitted to the hospital, discharge time will be determined by your treatment team.   Patients discharged the day of surgery will not be allowed to drive home, and someone needs to stay with them for 24 hours.    Special instructions:   [facility]- Preparing For Surgery  Before surgery, you can play an important role. Because skin is not sterile, your skin needs to be as free of germs as possible. You can reduce the number of germs on your skin by washing with CHG (chlorahexidine gluconate) Soap before surgery.  CHG is an antiseptic cleaner which kills germs and bonds with the skin to continue killing germs even after washing.    Oral Hygiene is also important to reduce your risk of infection.  Remember - BRUSH YOUR TEETH THE MORNING OF SURGERY WITH YOUR REGULAR TOOTHPASTE  Please do not use if you have an allergy to CHG or antibacterial soaps. If your skin becomes reddened/irritated stop using the CHG.  Do not shave (including legs and underarms) for at least 48 hours prior to first CHG shower. It is OK to shave your face.  Please follow these instructions carefully.   1. Shower the NIGHT BEFORE SURGERY and the MORNING OF SURGERY with  CHG Soap.   2. If you chose to wash your hair, wash your hair first as usual with your normal shampoo.  3. After you shampoo, rinse your hair and body thoroughly to remove the shampoo.  4. Use CHG as you would any other liquid soap. You can apply CHG directly to the skin and wash gently with a scrungie or a clean washcloth.   5. Apply the CHG Soap to your body ONLY FROM THE NECK DOWN.  Do not use on open wounds or open sores. Avoid contact with your eyes, ears, mouth and genitals (private parts). Wash Face and genitals (private parts)  with your normal soap.   6. Wash thoroughly, paying special attention to the area where your surgery will be performed.  7. Thoroughly rinse your body with warm water from the neck down.  8. DO NOT shower/wash with your  normal soap after using and rinsing off the CHG Soap.  9. Pat yourself dry with a CLEAN TOWEL.  10. Wear CLEAN PAJAMAS to bed the night before surgery, wear comfortable clothes the morning of surgery  11. Place CLEAN SHEETS on your bed the night of your first shower and DO NOT SLEEP WITH PETS.   Day of Surgery:   Do not apply any deodorants/lotions.  Please wear clean clothes to the hospital/surgery center.   Remember to brush your teeth WITH YOUR REGULAR TOOTHPASTE.   Please read over the following fact sheets that you were given.

## 2019-12-28 ENCOUNTER — Encounter (HOSPITAL_COMMUNITY): Payer: Self-pay

## 2019-12-28 ENCOUNTER — Other Ambulatory Visit: Payer: Self-pay

## 2019-12-28 ENCOUNTER — Other Ambulatory Visit (HOSPITAL_COMMUNITY)
Admission: RE | Admit: 2019-12-28 | Discharge: 2019-12-28 | Disposition: A | Discharge status: Home / Self Care | Payer: Medicare Other | Source: Ambulatory Visit | Attending: Orthopaedic Surgery | Admitting: Orthopaedic Surgery

## 2019-12-28 ENCOUNTER — Encounter (HOSPITAL_COMMUNITY)
Admission: RE | Admit: 2019-12-28 | Discharge: 2019-12-28 | Disposition: A | Discharge status: Home / Self Care | Payer: Medicare Other | Source: Ambulatory Visit | Attending: Physician Assistant | Admitting: Physician Assistant

## 2019-12-28 ENCOUNTER — Encounter (HOSPITAL_COMMUNITY)
Admission: RE | Admit: 2019-12-28 | Discharge: 2019-12-28 | Disposition: A | Discharge status: Home / Self Care | Payer: Medicare Other | Source: Ambulatory Visit | Attending: Orthopaedic Surgery | Admitting: Orthopaedic Surgery

## 2019-12-28 ENCOUNTER — Telehealth: Payer: Self-pay | Admitting: *Deleted

## 2019-12-28 DIAGNOSIS — Z01818 Encounter for other preprocedural examination: Secondary | ICD-10-CM | POA: Diagnosis not present

## 2019-12-28 DIAGNOSIS — Z01812 Encounter for preprocedural laboratory examination: Secondary | ICD-10-CM | POA: Diagnosis not present

## 2019-12-28 DIAGNOSIS — J449 Chronic obstructive pulmonary disease, unspecified: Secondary | ICD-10-CM | POA: Diagnosis not present

## 2019-12-28 DIAGNOSIS — Z20822 Contact with and (suspected) exposure to covid-19: Secondary | ICD-10-CM | POA: Insufficient documentation

## 2019-12-28 DIAGNOSIS — M1711 Unilateral primary osteoarthritis, right knee: Secondary | ICD-10-CM | POA: Diagnosis not present

## 2019-12-28 HISTORY — DX: Unspecified osteoarthritis, unspecified site: M19.90

## 2019-12-28 HISTORY — DX: Pneumonia, unspecified organism: J18.9

## 2019-12-28 HISTORY — DX: Essential (primary) hypertension: I10

## 2019-12-28 HISTORY — DX: Dyspnea, unspecified: R06.00

## 2019-12-28 HISTORY — DX: Hypothyroidism, unspecified: E03.9

## 2019-12-28 LAB — COMPREHENSIVE METABOLIC PANEL
ALT: 16 U/L (ref 0–44)
AST: 22 U/L (ref 15–41)
Albumin: 4.2 g/dL (ref 3.5–5.0)
Alkaline Phosphatase: 66 U/L (ref 38–126)
Anion gap: 10 (ref 5–15)
BUN: 15 mg/dL (ref 8–23)
CO2: 26 mmol/L (ref 22–32)
Calcium: 9.5 mg/dL (ref 8.9–10.3)
Chloride: 104 mmol/L (ref 98–111)
Creatinine, Ser: 0.93 mg/dL (ref 0.44–1.00)
GFR calc Af Amer: >60 mL/min (ref >60)
GFR calc non Af Amer: 57 mL/min — ABNORMAL LOW (ref >60)
Glucose, Bld: 91 mg/dL (ref 70–99)
Potassium: 3.9 mmol/L (ref 3.5–5.1)
Sodium: 140 mmol/L (ref 135–145)
Total Bilirubin: 0.4 mg/dL (ref 0.3–1.2)
Total Protein: 7.6 g/dL (ref 6.5–8.1)

## 2019-12-28 LAB — CBC WITH DIFFERENTIAL/PLATELET
Abs Immature Granulocytes: 0.03 10*3/uL (ref 0.00–0.07)
Basophils Absolute: 0.1 10*3/uL (ref 0.0–0.1)
Basophils Relative: 1 %
Eosinophils Absolute: 0.2 10*3/uL (ref 0.0–0.5)
Eosinophils Relative: 2 %
HCT: 43 % (ref 36.0–46.0)
Hemoglobin: 13.3 g/dL (ref 12.0–15.0)
Immature Granulocytes: 0 %
Lymphocytes Relative: 32 %
Lymphs Abs: 2.8 10*3/uL (ref 0.7–4.0)
MCH: 29.2 pg (ref 26.0–34.0)
MCHC: 30.9 g/dL (ref 30.0–36.0)
MCV: 94.3 fL (ref 80.0–100.0)
Monocytes Absolute: 0.6 10*3/uL (ref 0.1–1.0)
Monocytes Relative: 7 %
Neutro Abs: 5 10*3/uL (ref 1.7–7.7)
Neutrophils Relative %: 58 %
Platelets: 229 10*3/uL (ref 150–400)
RBC: 4.56 MIL/uL (ref 3.87–5.11)
RDW: 14.4 % (ref 11.5–15.5)
WBC: 8.7 10*3/uL (ref 4.0–10.5)
nRBC: 0 % (ref 0.0–0.2)

## 2019-12-28 LAB — URINALYSIS, ROUTINE W REFLEX MICROSCOPIC
Bacteria, UA: NONE SEEN
Bilirubin Urine: NEGATIVE
Glucose, UA: NEGATIVE mg/dL
Ketones, ur: NEGATIVE mg/dL
Nitrite: NEGATIVE
Protein, ur: NEGATIVE mg/dL
Specific Gravity, Urine: 1.01 (ref 1.005–1.030)
pH: 5 (ref 5.0–8.0)

## 2019-12-28 LAB — TYPE AND SCREEN
ABO/RH(D): O POS
Antibody Screen: NEGATIVE

## 2019-12-28 LAB — PROTIME-INR
INR: 1 (ref 0.8–1.2)
Prothrombin Time: 13.1 seconds (ref 11.4–15.2)

## 2019-12-28 LAB — ABO/RH: ABO/RH(D): O POS

## 2019-12-28 LAB — SURGICAL PCR SCREEN
MRSA, PCR: NEGATIVE
Staphylococcus aureus: NEGATIVE

## 2019-12-28 LAB — SARS CORONAVIRUS 2 (TAT 6-24 HRS): SARS Coronavirus 2: NEGATIVE

## 2019-12-28 LAB — APTT: aPTT: 28 seconds (ref 24–36)

## 2019-12-28 NOTE — Care Plan (Signed)
RNCM call to patient to discuss her upcoming Right TKA with Dr. Erlinda Hong on Monday, 01/01/20. She is an Ortho bundle patient through [facility]. Discussed this and answered questions related to CM. Patient agreeable to CM. She will have assistance at home from her daughter after surgery. CPM and FWW ordered through [facility] today. Delivery most likely tomorrow to the home. Anticipate HHPT will be needed after short hospital stay. Patient states she has been referred to [facility] already for nursing related to lower extremity lymph edema/fluid wraps by her PCP. CM checked with [facility] liaison, who informed that skilled nursing has been ordered. Will check to see if HHPT can be accommodated since this will be a bundle patient. Will assist with scheduling OPPT as well for approximately 2 weeks post op. Will continue to follow for CM needs.

## 2019-12-28 NOTE — Telephone Encounter (Signed)
Ortho bundle pre-op call completed. 

## 2019-12-28 NOTE — Progress Notes (Signed)
PCP - Dr. Mayra Neer Cardiologist - Saw Dr. Lawson Radar at [facility] on [address] (Records requested)   PPM/ICD - Denies  Chest x-ray - 12/28/19 EKG - Feb-Mar 2021 Records requested from [facility] Denies ECHO - Feb-Mar 2021 Records requested from [facility] - Denies  Sleep Study - Denies  DM - Denies  Aspirin Instructions:n/a  ERAS Protcol -Yes  PRE-SURGERY Ensure or G2- Given   COVID TEST- 12/28/19  Anesthesia review: Yes had workup at [facility] on [address] Feb-Mar records requested 12/28/19  Patient denies  fever, cough and chest pain at PAT appointment Patient stated that at baseline she has SOB on exertion d/t COPD  All instructions explained to the patient, with a verbal understanding of the material. Patient agrees to go over the instructions while at home for a better understanding. Patient also instructed to self quarantine after being tested for COVID-19. The opportunity to ask questions was provided.

## 2019-12-28 NOTE — Progress Notes (Signed)
Sent message to surgeon's PA Tiney Rouge regarding UA with trace leukocytes.

## 2019-12-29 ENCOUNTER — Telehealth: Payer: Self-pay | Admitting: *Deleted

## 2019-12-29 ENCOUNTER — Other Ambulatory Visit: Payer: Self-pay | Admitting: Physician Assistant

## 2019-12-29 MED ORDER — OXYCODONE HCL ER 10 MG PO T12A: 10.0000 mg | EXTENDED_RELEASE_TABLET | Freq: Two times a day (BID) | ORAL | 0 refills | Status: DC

## 2019-12-29 MED ORDER — ONDANSETRON HCL 4 MG PO TABS: 4.0000 mg | ORAL_TABLET | Freq: Three times a day (TID) | ORAL | 0 refills | Status: DC | PRN

## 2019-12-29 MED ORDER — OXYCODONE-ACETAMINOPHEN 5-325 MG PO TABS: 1.0000 | ORAL_TABLET | Freq: Four times a day (QID) | ORAL | 0 refills | Status: DC | PRN

## 2019-12-29 MED ORDER — BUPIVACAINE LIPOSOME 1.3 % IJ SUSP
20.0000 mL | Freq: Once | INTRAMUSCULAR | Status: DC
  Filled 2019-12-29: qty 20

## 2019-12-29 MED ORDER — TRANEXAMIC ACID 1000 MG/10ML IV SOLN
2000.0000 mg | INTRAVENOUS | Status: AC
  Administered 2020-01-01: 10:00:00 2000 mg via TOPICAL
  Filled 2019-12-29: qty 20

## 2019-12-29 MED ORDER — DOCUSATE SODIUM 100 MG PO CAPS: 100.0000 mg | ORAL_CAPSULE | Freq: Every day | ORAL | 2 refills | Status: AC | PRN

## 2019-12-29 MED ORDER — CIPROFLOXACIN HCL 500 MG PO TABS: 500.0000 mg | ORAL_TABLET | Freq: Every day | ORAL | 0 refills | Status: DC

## 2019-12-29 MED ORDER — METHOCARBAMOL 500 MG PO TABS: 500.0000 mg | ORAL_TABLET | Freq: Two times a day (BID) | ORAL | 0 refills | Status: DC | PRN

## 2019-12-29 MED ORDER — RIVAROXABAN 10 MG PO TABS: 10.0000 mg | ORAL_TABLET | Freq: Every day | ORAL | 0 refills | Status: DC

## 2019-12-29 NOTE — Care Plan (Signed)
RNCM call to patient to inform that Dr. Erlinda Hong has prescribed Cipro for her to pick up today and take for next 3 days due to Leukocytes in her urine at pre-op assessment yesterday. Reviewed that all post-op medications have been called into her pharmacy as well for pick up, but informed not to take until after surgery. Patient states she is out of pocket for all medications as she doesn't have a prescription plan. She will contact her PCP office to see about samples of Xarelto for after surgery as prescribed by Dr. Erlinda Hong. Also, made aware that CM has reached out to [facility] regarding ordered [facility] skilled nursing for Unna boot wraps. She informed they have called her and are planning on coming out (per her request) next week after her surgery. RNCM still concerned how she will be able to manage unna boot wraps on non-surgical leg while recovering from TKA on right lower extremity. Also discussed transitioning to OPPT and if [facility] nursing is still active. RNCM left message at Dr. Raul Del office (Patient's PCP) to inquire if unna boot dressings can be held for a little bit until rehab from knee replacement is completed or possibly transitioning her over to a lymphedema clinic where therapy can rehab knee as well as perform unna boot changing. Staff informed that they will give message and have MD contact office back to discuss.

## 2019-12-29 NOTE — Progress Notes (Signed)
Please let her know she has a UTI and I have called in meds to start asap!

## 2019-12-29 NOTE — Progress Notes (Addendum)
Anesthesia Chart Review:  Hx of PE 05/17/18. Per ED note at that time, "No RV strain or signs of more severe disease. She is not hypoxic, tachycardic, hypotensive or in distress." She was started on Xarelto and discharged for PCP followup.   Preop clearance from Dr. Vira Browns dated 12/01/19 states, "Labs 05 Oct 2019 with CKD 3, EGFR 47, creatinine 1.1, TFTs WNL, EKG RBBB. 25 Oct 2019 TTE EF 60 to 65% with mild concentric LVH otherwise within normal limits. Would order CBC and coags prior to procedure." Copy on pt chart. It appears pt was seen by Dr. Carney Bern at [facility] in Feb. 2020, however her regular PCP is Dr. Mayra Neer at [facility]. Dr Brigitte Pulse also cleared the pt - clearance note dated 12/01/19 states "Just note her age and COPD." Copy on chart.   Preop labs reviewed, WNL. Creatinine 0.93.   CHEST - 2 VIEW 12/28/19: COMPARISON:  05/24/2019  FINDINGS: The lungs are hyperinflated with diffuse interstitial prominence. No focal airspace consolidation or pulmonary edema. No pleural effusion or pneumothorax. Normal cardiomediastinal contours. Nodular opacity previously visualized in the left mid lung has resolved.  IMPRESSION: COPD without acute airspace disease.  Wynonia Musty [facility] Short Stay Center/Anesthesiology Phone [phone] 12/29/2019 11:07 AM

## 2019-12-29 NOTE — Anesthesia Preprocedure Evaluation (Addendum)
Anesthesia Evaluation  Patient identified by MRN, date of birth, ID band Patient awake    Reviewed: Allergy & Precautions, NPO status , Patient's Chart, lab work & pertinent test results  History of Anesthesia Complications Negative for: history of anesthetic complications  Airway Mallampati: II  TM Distance: >3 FB Neck ROM: Full    Dental  (+) Dental Advisory Given, Teeth Intact   Pulmonary shortness of breath, COPD, former smoker,    breath sounds clear to auscultation       Cardiovascular hypertension, Pt. on medications  Rhythm:Regular     Neuro/Psych negative neurological ROS  negative psych ROS   GI/Hepatic negative GI ROS, Neg liver ROS,   Endo/Other  Hypothyroidism   Renal/GU CRFRenal disease     Musculoskeletal  (+) Arthritis ,   Abdominal   Peds  Hematology negative hematology ROS (+) xarelto ordered for post op   Anesthesia Other Findings   Reproductive/Obstetrics                            Anesthesia Physical Anesthesia Plan  ASA: II  Anesthesia Plan: MAC, Spinal and Regional   Post-op Pain Management:  Regional for Post-op pain   Induction:   PONV Risk Score and Plan: 2 and Treatment may vary due to age or medical condition and Propofol infusion  Airway Management Planned: Nasal Cannula  Additional Equipment:   Intra-op Plan:   Post-operative Plan:   Informed Consent: I have reviewed the patients History and Physical, chart, labs and discussed the procedure including the risks, benefits and alternatives for the proposed anesthesia with the patient or authorized representative who has indicated his/her understanding and acceptance.     Dental advisory given  Plan Discussed with: CRNA and Surgeon  Anesthesia Plan Comments: (Hx of PE 05/17/18. Per ED note at that time, "No RV strain or signs of more severe disease. She is not hypoxic, tachycardic, hypotensive or  in distress." She was started on Xarelto and discharged for PCP followup.   Preop clearance from Dr. Vira Browns dated 12/01/19 states, "Labs 05 Oct 2019 with CKD 3, EGFR 47, creatinine 1.1, TFTs WNL, EKG RBBB. 25 Oct 2019 TTE EF 60 to 65% with mild concentric LVH otherwise within normal limits. Would order CBC and coags prior to procedure." Copy on pt chart. It appears pt was seen by Dr. Carney Bern at [facility] in Feb. 2020, however her regular PCP is Dr. Mayra Neer at [facility]. Dr Brigitte Pulse also cleared the pt - clearance note dated 12/01/19 states "Just note her age and COPD." Copy on chart.   Preop labs reviewed, WNL. Creatinine 0.93.   CHEST - 2 VIEW 12/28/19: COMPARISON:  05/24/2019  FINDINGS: The lungs are hyperinflated with diffuse interstitial prominence. No focal airspace consolidation or pulmonary edema. No pleural effusion or pneumothorax. Normal cardiomediastinal contours. Nodular opacity previously visualized in the left mid lung has resolved.  IMPRESSION: COPD without acute airspace disease.)       Anesthesia Quick Evaluation

## 2019-12-29 NOTE — Telephone Encounter (Signed)
Ortho bundle call completed- Pre-op; see care plan note.

## 2020-01-01 ENCOUNTER — Encounter (HOSPITAL_COMMUNITY)
Admission: RE | Disposition: A | Discharge status: Home Health | Payer: Self-pay | Source: Home / Self Care | Attending: Orthopaedic Surgery

## 2020-01-01 ENCOUNTER — Ambulatory Visit (HOSPITAL_COMMUNITY): Payer: Medicare Other | Admitting: Physician Assistant

## 2020-01-01 ENCOUNTER — Observation Stay (HOSPITAL_COMMUNITY): Payer: Medicare Other

## 2020-01-01 ENCOUNTER — Ambulatory Visit (HOSPITAL_COMMUNITY): Payer: Medicare Other | Admitting: Certified Registered Nurse Anesthetist

## 2020-01-01 ENCOUNTER — Other Ambulatory Visit: Payer: Self-pay

## 2020-01-01 ENCOUNTER — Encounter (HOSPITAL_COMMUNITY): Payer: Self-pay | Admitting: Orthopaedic Surgery

## 2020-01-01 ENCOUNTER — Inpatient Hospital Stay (HOSPITAL_COMMUNITY)
Admission: RE | Admit: 2020-01-01 | Discharge: 2020-01-03 | DRG: 470 | Disposition: A | Discharge status: Home Health | Payer: Medicare Other | Attending: Orthopaedic Surgery | Admitting: Orthopaedic Surgery

## 2020-01-01 DIAGNOSIS — Z7901 Long term (current) use of anticoagulants: Secondary | ICD-10-CM

## 2020-01-01 DIAGNOSIS — M8580 Other specified disorders of bone density and structure, unspecified site: Secondary | ICD-10-CM | POA: Diagnosis present

## 2020-01-01 DIAGNOSIS — Z87891 Personal history of nicotine dependence: Secondary | ICD-10-CM

## 2020-01-01 DIAGNOSIS — M1710 Unilateral primary osteoarthritis, unspecified knee: Secondary | ICD-10-CM | POA: Diagnosis present

## 2020-01-01 DIAGNOSIS — Z803 Family history of malignant neoplasm of breast: Secondary | ICD-10-CM | POA: Diagnosis not present

## 2020-01-01 DIAGNOSIS — I10 Essential (primary) hypertension: Secondary | ICD-10-CM | POA: Diagnosis present

## 2020-01-01 DIAGNOSIS — Z86711 Personal history of pulmonary embolism: Secondary | ICD-10-CM

## 2020-01-01 DIAGNOSIS — M1711 Unilateral primary osteoarthritis, right knee: Secondary | ICD-10-CM | POA: Diagnosis not present

## 2020-01-01 DIAGNOSIS — Z79890 Hormone replacement therapy (postmenopausal): Secondary | ICD-10-CM | POA: Diagnosis not present

## 2020-01-01 DIAGNOSIS — E039 Hypothyroidism, unspecified: Secondary | ICD-10-CM | POA: Diagnosis not present

## 2020-01-01 DIAGNOSIS — Z471 Aftercare following joint replacement surgery: Secondary | ICD-10-CM | POA: Diagnosis not present

## 2020-01-01 DIAGNOSIS — Z96651 Presence of right artificial knee joint: Secondary | ICD-10-CM | POA: Diagnosis not present

## 2020-01-01 DIAGNOSIS — J449 Chronic obstructive pulmonary disease, unspecified: Secondary | ICD-10-CM | POA: Diagnosis not present

## 2020-01-01 DIAGNOSIS — D62 Acute posthemorrhagic anemia: Secondary | ICD-10-CM | POA: Diagnosis present

## 2020-01-01 DIAGNOSIS — M179 Osteoarthritis of knee, unspecified: Secondary | ICD-10-CM | POA: Diagnosis present

## 2020-01-01 DIAGNOSIS — Z79899 Other long term (current) drug therapy: Secondary | ICD-10-CM

## 2020-01-01 HISTORY — PX: TOTAL KNEE ARTHROPLASTY: SHX125

## 2020-01-01 SURGERY — ARTHROPLASTY, KNEE, TOTAL
Anesthesia: Monitor Anesthesia Care | Site: Knee | Laterality: Right

## 2020-01-01 MED ORDER — IRRISEPT - 450ML BOTTLE WITH 0.05% CHG IN STERILE WATER, USP 99.95% OPTIME
TOPICAL | Status: DC | PRN
  Administered 2020-01-01: 10:00:00 450 mL via TOPICAL

## 2020-01-01 MED ORDER — MIDAZOLAM HCL 2 MG/2ML IJ SOLN
INTRAMUSCULAR | Status: AC
  Filled 2020-01-01: qty 2

## 2020-01-01 MED ORDER — ONDANSETRON HCL 4 MG/2ML IJ SOLN
4.0000 mg | Freq: Four times a day (QID) | INTRAMUSCULAR | Status: DC | PRN
  Administered 2020-01-02: 4 mg via INTRAVENOUS
  Filled 2020-01-01: qty 2

## 2020-01-01 MED ORDER — CEFAZOLIN SODIUM-DEXTROSE 2-4 GM/100ML-% IV SOLN
2.0000 g | INTRAVENOUS | Status: AC
  Administered 2020-01-01: 10:00:00 2 g via INTRAVENOUS
  Filled 2020-01-01: qty 100

## 2020-01-01 MED ORDER — SORBITOL 70 % SOLN: 30.0000 mL | Freq: Every day | Status: DC | PRN

## 2020-01-01 MED ORDER — ONDANSETRON HCL 4 MG/2ML IJ SOLN
INTRAMUSCULAR | Status: DC | PRN
  Administered 2020-01-01: 4 mg via INTRAVENOUS

## 2020-01-01 MED ORDER — OXYCODONE HCL 5 MG PO TABS: 5.0000 mg | ORAL_TABLET | Freq: Once | ORAL | Status: DC | PRN

## 2020-01-01 MED ORDER — CELECOXIB 200 MG PO CAPS
200.0000 mg | ORAL_CAPSULE | Freq: Two times a day (BID) | ORAL | Status: DC
  Administered 2020-01-01 – 2020-01-03 (×4): 200 mg via ORAL
  Filled 2020-01-01 (×6): qty 1

## 2020-01-01 MED ORDER — POLYETHYLENE GLYCOL 3350 17 G PO PACK: 17.0000 g | PACK | Freq: Every day | ORAL | Status: DC | PRN

## 2020-01-01 MED ORDER — ONDANSETRON HCL 4 MG PO TABS
4.0000 mg | ORAL_TABLET | Freq: Four times a day (QID) | ORAL | Status: DC | PRN
  Administered 2020-01-03: 4 mg via ORAL
  Filled 2020-01-01: qty 1

## 2020-01-01 MED ORDER — OXYCODONE HCL ER 10 MG PO T12A
10.0000 mg | EXTENDED_RELEASE_TABLET | Freq: Two times a day (BID) | ORAL | Status: DC
  Administered 2020-01-01 – 2020-01-02 (×2): 10 mg via ORAL
  Filled 2020-01-01 (×3): qty 1

## 2020-01-01 MED ORDER — LOSARTAN POTASSIUM 50 MG PO TABS
50.0000 mg | ORAL_TABLET | Freq: Every day | ORAL | Status: DC
  Administered 2020-01-01 – 2020-01-03 (×3): 50 mg via ORAL
  Filled 2020-01-01 (×3): qty 1

## 2020-01-01 MED ORDER — MAGNESIUM CITRATE PO SOLN: 1.0000 | Freq: Once | ORAL | Status: DC | PRN

## 2020-01-01 MED ORDER — SODIUM CHLORIDE 0.9 % IR SOLN
Status: DC | PRN
  Administered 2020-01-01: 3000 mL

## 2020-01-01 MED ORDER — CIPROFLOXACIN HCL 500 MG PO TABS: 500.0000 mg | ORAL_TABLET | Freq: Every day | ORAL | Status: DC

## 2020-01-01 MED ORDER — CEFAZOLIN SODIUM-DEXTROSE 2-4 GM/100ML-% IV SOLN
2.0000 g | Freq: Three times a day (TID) | INTRAVENOUS | Status: AC
  Administered 2020-01-01 – 2020-01-02 (×2): 2 g via INTRAVENOUS
  Filled 2020-01-01 (×2): qty 100

## 2020-01-01 MED ORDER — METOCLOPRAMIDE HCL 5 MG PO TABS: 5.0000 mg | ORAL_TABLET | Freq: Three times a day (TID) | ORAL | Status: DC | PRN

## 2020-01-01 MED ORDER — DIPHENHYDRAMINE HCL 12.5 MG/5ML PO ELIX: 25.0000 mg | ORAL_SOLUTION | ORAL | Status: DC | PRN

## 2020-01-01 MED ORDER — 0.9 % SODIUM CHLORIDE (POUR BTL) OPTIME
TOPICAL | Status: DC | PRN
  Administered 2020-01-01: 10:00:00 1000 mL

## 2020-01-01 MED ORDER — PROPOFOL 10 MG/ML IV BOLUS
INTRAVENOUS | Status: DC | PRN
  Administered 2020-01-01 (×3): 10 mg via INTRAVENOUS

## 2020-01-01 MED ORDER — ONDANSETRON HCL 4 MG/2ML IJ SOLN
INTRAMUSCULAR | Status: AC
  Filled 2020-01-01: qty 2

## 2020-01-01 MED ORDER — RIVAROXABAN 10 MG PO TABS
10.0000 mg | ORAL_TABLET | Freq: Every day | ORAL | Status: DC
  Administered 2020-01-02 – 2020-01-03 (×2): 10 mg via ORAL
  Filled 2020-01-01 (×2): qty 1

## 2020-01-01 MED ORDER — LEVOTHYROXINE SODIUM 100 MCG PO TABS
100.0000 ug | ORAL_TABLET | Freq: Every day | ORAL | Status: DC
  Administered 2020-01-02 – 2020-01-03 (×2): 100 ug via ORAL
  Filled 2020-01-01 (×2): qty 1

## 2020-01-01 MED ORDER — BUPIVACAINE-EPINEPHRINE 0.25% -1:200000 IJ SOLN
INTRAMUSCULAR | Status: DC | PRN
  Administered 2020-01-01: 20 mL

## 2020-01-01 MED ORDER — MENTHOL 3 MG MT LOZG: 1.0000 | LOZENGE | OROMUCOSAL | Status: DC | PRN

## 2020-01-01 MED ORDER — KETOROLAC TROMETHAMINE 15 MG/ML IJ SOLN
30.0000 mg | Freq: Four times a day (QID) | INTRAMUSCULAR | Status: AC
  Administered 2020-01-01 – 2020-01-02 (×4): 30 mg via INTRAVENOUS
  Filled 2020-01-01 (×4): qty 2

## 2020-01-01 MED ORDER — ALUM & MAG HYDROXIDE-SIMETH 200-200-20 MG/5ML PO SUSP: 30.0000 mL | ORAL | Status: DC | PRN

## 2020-01-01 MED ORDER — METHOCARBAMOL 1000 MG/10ML IJ SOLN
500.0000 mg | Freq: Four times a day (QID) | INTRAVENOUS | Status: DC | PRN
  Filled 2020-01-01: qty 5

## 2020-01-01 MED ORDER — LIDOCAINE 2% (20 MG/ML) 5 ML SYRINGE
INTRAMUSCULAR | Status: AC
  Filled 2020-01-01: qty 5

## 2020-01-01 MED ORDER — OXYCODONE HCL 5 MG PO TABS: 10.0000 mg | ORAL_TABLET | ORAL | Status: DC | PRN

## 2020-01-01 MED ORDER — PHENOL 1.4 % MT LIQD: 1.0000 | OROMUCOSAL | Status: DC | PRN

## 2020-01-01 MED ORDER — METHOCARBAMOL 500 MG PO TABS: 500.0000 mg | ORAL_TABLET | Freq: Four times a day (QID) | ORAL | Status: DC | PRN

## 2020-01-01 MED ORDER — FENTANYL CITRATE (PF) 100 MCG/2ML IJ SOLN
INTRAMUSCULAR | Status: AC
  Administered 2020-01-01: 50 ug via INTRAVENOUS
  Filled 2020-01-01: qty 2

## 2020-01-01 MED ORDER — POVIDONE-IODINE 10 % EX SWAB
2.0000 "application " | Freq: Once | CUTANEOUS | Status: AC
  Administered 2020-01-01: 2 via TOPICAL

## 2020-01-01 MED ORDER — METOCLOPRAMIDE HCL 5 MG/ML IJ SOLN: 5.0000 mg | Freq: Three times a day (TID) | INTRAMUSCULAR | Status: DC | PRN

## 2020-01-01 MED ORDER — LIDOCAINE HCL (CARDIAC) PF 100 MG/5ML IV SOSY
PREFILLED_SYRINGE | INTRAVENOUS | Status: DC | PRN
  Administered 2020-01-01: 40 mg via INTRAVENOUS

## 2020-01-01 MED ORDER — DOCUSATE SODIUM 100 MG PO CAPS
100.0000 mg | ORAL_CAPSULE | Freq: Two times a day (BID) | ORAL | Status: DC
  Administered 2020-01-01 – 2020-01-03 (×4): 100 mg via ORAL
  Filled 2020-01-01 (×4): qty 1

## 2020-01-01 MED ORDER — ACETAMINOPHEN 500 MG PO TABS: 1000.0000 mg | ORAL_TABLET | Freq: Once | ORAL | Status: DC | PRN

## 2020-01-01 MED ORDER — SULFAMETHOXAZOLE-TRIMETHOPRIM 800-160 MG PO TABS: 1.0000 | ORAL_TABLET | Freq: Two times a day (BID) | ORAL | 0 refills | Status: DC

## 2020-01-01 MED ORDER — ACETAMINOPHEN 500 MG PO TABS
1000.0000 mg | ORAL_TABLET | Freq: Four times a day (QID) | ORAL | Status: AC
  Administered 2020-01-01 – 2020-01-02 (×4): 1000 mg via ORAL
  Filled 2020-01-01 (×4): qty 2

## 2020-01-01 MED ORDER — ACETAMINOPHEN 160 MG/5ML PO SOLN: 1000.0000 mg | Freq: Once | ORAL | Status: DC | PRN

## 2020-01-01 MED ORDER — SODIUM CHLORIDE 0.9 % IV SOLN: INTRAVENOUS | Status: DC

## 2020-01-01 MED ORDER — OXYCODONE HCL 5 MG PO TABS
5.0000 mg | ORAL_TABLET | ORAL | Status: DC | PRN
  Administered 2020-01-03: 10 mg via ORAL
  Filled 2020-01-01: qty 2

## 2020-01-01 MED ORDER — LACTATED RINGERS IV SOLN: INTRAVENOUS | Status: DC

## 2020-01-01 MED ORDER — PHENYLEPHRINE HCL-NACL 10-0.9 MG/250ML-% IV SOLN
INTRAVENOUS | Status: DC | PRN
  Administered 2020-01-01: 25 ug/min via INTRAVENOUS

## 2020-01-01 MED ORDER — PHENYLEPHRINE 40 MCG/ML (10ML) SYRINGE FOR IV PUSH (FOR BLOOD PRESSURE SUPPORT)
PREFILLED_SYRINGE | INTRAVENOUS | Status: AC
  Filled 2020-01-01: qty 10

## 2020-01-01 MED ORDER — FENTANYL CITRATE (PF) 100 MCG/2ML IJ SOLN: 50.0000 ug | Freq: Once | INTRAMUSCULAR | Status: AC

## 2020-01-01 MED ORDER — VANCOMYCIN HCL 1000 MG IV SOLR
INTRAVENOUS | Status: AC
  Filled 2020-01-01: qty 1000

## 2020-01-01 MED ORDER — ACETAMINOPHEN 325 MG PO TABS: 325.0000 mg | ORAL_TABLET | Freq: Four times a day (QID) | ORAL | Status: DC | PRN

## 2020-01-01 MED ORDER — BUPIVACAINE IN DEXTROSE 0.75-8.25 % IT SOLN
INTRATHECAL | Status: DC | PRN
  Administered 2020-01-01: 1.8 mL via INTRATHECAL

## 2020-01-01 MED ORDER — DEXAMETHASONE SODIUM PHOSPHATE 10 MG/ML IJ SOLN
10.0000 mg | Freq: Once | INTRAMUSCULAR | Status: AC
  Administered 2020-01-02: 10 mg via INTRAVENOUS
  Filled 2020-01-01: qty 1

## 2020-01-01 MED ORDER — BUPIVACAINE HCL (PF) 0.25 % IJ SOLN
INTRAMUSCULAR | Status: AC
  Filled 2020-01-01: qty 20

## 2020-01-01 MED ORDER — TRANEXAMIC ACID-NACL 1000-0.7 MG/100ML-% IV SOLN
1000.0000 mg | Freq: Once | INTRAVENOUS | Status: AC
  Administered 2020-01-01: 1000 mg via INTRAVENOUS
  Filled 2020-01-01: qty 100

## 2020-01-01 MED ORDER — PHENYLEPHRINE 40 MCG/ML (10ML) SYRINGE FOR IV PUSH (FOR BLOOD PRESSURE SUPPORT)
PREFILLED_SYRINGE | INTRAVENOUS | Status: DC | PRN
  Administered 2020-01-01 (×2): 100 ug via INTRAVENOUS

## 2020-01-01 MED ORDER — PROPOFOL 1000 MG/100ML IV EMUL
INTRAVENOUS | Status: AC
  Filled 2020-01-01: qty 100

## 2020-01-01 MED ORDER — ACETAMINOPHEN 10 MG/ML IV SOLN: 1000.0000 mg | Freq: Once | INTRAVENOUS | Status: DC | PRN

## 2020-01-01 MED ORDER — SODIUM CHLORIDE 0.9% FLUSH
INTRAVENOUS | Status: DC | PRN
  Administered 2020-01-01 (×2): 10 mL

## 2020-01-01 MED ORDER — PROPOFOL 500 MG/50ML IV EMUL
INTRAVENOUS | Status: DC | PRN
  Administered 2020-01-01: 75 ug/kg/min via INTRAVENOUS
  Administered 2020-01-01: 60 ug/kg/min via INTRAVENOUS

## 2020-01-01 MED ORDER — TRANEXAMIC ACID-NACL 1000-0.7 MG/100ML-% IV SOLN
1000.0000 mg | INTRAVENOUS | Status: AC
  Administered 2020-01-01: 10:00:00 1000 mg via INTRAVENOUS
  Filled 2020-01-01: qty 100

## 2020-01-01 MED ORDER — HYDROMORPHONE HCL 1 MG/ML IJ SOLN: 0.5000 mg | INTRAMUSCULAR | Status: DC | PRN

## 2020-01-01 MED ORDER — GABAPENTIN 300 MG PO CAPS
300.0000 mg | ORAL_CAPSULE | Freq: Three times a day (TID) | ORAL | Status: DC
  Administered 2020-01-01 – 2020-01-03 (×6): 300 mg via ORAL
  Filled 2020-01-01 (×6): qty 1

## 2020-01-01 MED ORDER — FENTANYL CITRATE (PF) 100 MCG/2ML IJ SOLN: 25.0000 ug | INTRAMUSCULAR | Status: DC | PRN

## 2020-01-01 MED ORDER — OXYCODONE HCL 5 MG/5ML PO SOLN: 5.0000 mg | Freq: Once | ORAL | Status: DC | PRN

## 2020-01-01 SURGICAL SUPPLY — 82 items
ADH SKN CLS APL DERMABOND .7 (GAUZE/BANDAGES/DRESSINGS) ×1
ALCOHOL 70% 16 OZ (MISCELLANEOUS) ×3 IMPLANT
BAG DECANTER FOR FLEXI CONT (MISCELLANEOUS) ×3 IMPLANT
BANDAGE ESMARK 6X9 LF (GAUZE/BANDAGES/DRESSINGS) IMPLANT
BLADE SAG 18X100X1.27 (BLADE) ×3 IMPLANT
BNDG CMPR 9X6 STRL LF SNTH (GAUZE/BANDAGES/DRESSINGS)
BNDG ESMARK 6X9 LF (GAUZE/BANDAGES/DRESSINGS)
BOWL SMART MIX CTS (DISPOSABLE) ×3 IMPLANT
BSPLAT TIB 5D E CMNT KN RT (Knees) ×1 IMPLANT
CEMENT BONE REFOBACIN R1X40 US (Cement) ×4 IMPLANT
CLOSURE STERI-STRIP 1/2X4 (GAUZE/BANDAGES/DRESSINGS) ×2
CLSR STERI-STRIP ANTIMIC 1/2X4 (GAUZE/BANDAGES/DRESSINGS) ×4 IMPLANT
COMP FEM KNEE NRW SZ9 RT (Joint) ×3 IMPLANT
COMPONENT FEM KNEE NRW SZ9 RT (Joint) IMPLANT
COVER SURGICAL LIGHT HANDLE (MISCELLANEOUS) ×3 IMPLANT
COVER WAND RF STERILE (DRAPES) IMPLANT
CUFF TOURN SGL QUICK 34 (TOURNIQUET CUFF) ×3
CUFF TOURN SGL QUICK 42 (TOURNIQUET CUFF) IMPLANT
CUFF TRNQT CYL 34X4.125X (TOURNIQUET CUFF) ×1 IMPLANT
DERMABOND ADVANCED (GAUZE/BANDAGES/DRESSINGS) ×2
DERMABOND ADVANCED .7 DNX12 (GAUZE/BANDAGES/DRESSINGS) ×1 IMPLANT
DRAPE EXTREMITY T 121X128X90 (DISPOSABLE) ×3 IMPLANT
DRAPE HALF SHEET 40X57 (DRAPES) ×3 IMPLANT
DRAPE INCISE IOBAN 66X45 STRL (DRAPES) IMPLANT
DRAPE ORTHO SPLIT 77X108 STRL (DRAPES) ×6
DRAPE POUCH INSTRU U-SHP 10X18 (DRAPES) ×3 IMPLANT
DRAPE SURG ORHT 6 SPLT 77X108 (DRAPES) ×2 IMPLANT
DRAPE U-SHAPE 47X51 STRL (DRAPES) ×6 IMPLANT
DRSG AQUACEL AG ADV 3.5X10 (GAUZE/BANDAGES/DRESSINGS) ×3 IMPLANT
DURAPREP 26ML APPLICATOR (WOUND CARE) ×9 IMPLANT
ELECT CAUTERY BLADE 6.4 (BLADE) ×3 IMPLANT
ELECT REM PT RETURN 9FT ADLT (ELECTROSURGICAL) ×3
ELECTRODE REM PT RTRN 9FT ADLT (ELECTROSURGICAL) ×1 IMPLANT
GLOVE BIOGEL PI IND STRL 7.0 (GLOVE) ×1 IMPLANT
GLOVE BIOGEL PI INDICATOR 7.0 (GLOVE) ×2
GLOVE ECLIPSE 7.0 STRL STRAW (GLOVE) ×9 IMPLANT
GLOVE SKINSENSE NS SZ7.5 (GLOVE) ×6
GLOVE SKINSENSE STRL SZ7.5 (GLOVE) ×3 IMPLANT
GLOVE SURG SYN 7.5  E (GLOVE) ×12
GLOVE SURG SYN 7.5 E (GLOVE) ×4 IMPLANT
GLOVE SURG SYN 7.5 PF PI (GLOVE) ×4 IMPLANT
GOWN STRL REIN XL XLG (GOWN DISPOSABLE) ×3 IMPLANT
GOWN STRL REUS W/ TWL LRG LVL3 (GOWN DISPOSABLE) ×1 IMPLANT
GOWN STRL REUS W/TWL LRG LVL3 (GOWN DISPOSABLE) ×3
HANDPIECE INTERPULSE COAX TIP (DISPOSABLE) ×3
HDLS TROCR DRIL PIN KNEE 75 (PIN) ×3
HOOD PEEL AWAY FLYTE STAYCOOL (MISCELLANEOUS) ×6 IMPLANT
INSERT TIBIA KNEE RIGHT 10 (Joint) ×2 IMPLANT
JET LAVAGE IRRISEPT WOUND (IRRIGATION / IRRIGATOR) ×3
KIT BASIN OR (CUSTOM PROCEDURE TRAY) ×3 IMPLANT
KIT TURNOVER KIT B (KITS) ×3 IMPLANT
LAVAGE JET IRRISEPT WOUND (IRRIGATION / IRRIGATOR) ×1 IMPLANT
MANIFOLD NEPTUNE II (INSTRUMENTS) ×3 IMPLANT
MARKER SKIN DUAL TIP RULER LAB (MISCELLANEOUS) ×3 IMPLANT
NDL SPNL 18GX3.5 QUINCKE PK (NEEDLE) ×2 IMPLANT
NEEDLE SPNL 18GX3.5 QUINCKE PK (NEEDLE) ×6 IMPLANT
NS IRRIG 1000ML POUR BTL (IV SOLUTION) ×3 IMPLANT
PACK TOTAL JOINT (CUSTOM PROCEDURE TRAY) ×3 IMPLANT
PAD ARMBOARD 7.5X6 YLW CONV (MISCELLANEOUS) ×6 IMPLANT
PIN DRILL HDLS TROCAR 75 4PK (PIN) IMPLANT
SAW OSC TIP CART 19.5X105X1.3 (SAW) ×3 IMPLANT
SCREW FEMALE HEX FIX 25X2.5 (ORTHOPEDIC DISPOSABLE SUPPLIES) ×2 IMPLANT
SET HNDPC FAN SPRY TIP SCT (DISPOSABLE) ×1 IMPLANT
STAPLER VISISTAT 35W (STAPLE) IMPLANT
STEM POLY PAT PLY 35M KNEE (Knees) ×2 IMPLANT
STEM TIBIA 5 DEG SZ E R KNEE (Knees) IMPLANT
SUCTION FRAZIER HANDLE 10FR (MISCELLANEOUS) ×3
SUCTION TUBE FRAZIER 10FR DISP (MISCELLANEOUS) ×1 IMPLANT
SUT ETHILON 2 0 FS 18 (SUTURE) ×2 IMPLANT
SUT MNCRL AB 4-0 PS2 18 (SUTURE) IMPLANT
SUT VIC AB 0 CT1 27 (SUTURE) ×6
SUT VIC AB 0 CT1 27XBRD ANBCTR (SUTURE) ×2 IMPLANT
SUT VIC AB 1 CTX 27 (SUTURE) ×9 IMPLANT
SUT VIC AB 2-0 CT1 27 (SUTURE) ×12
SUT VIC AB 2-0 CT1 TAPERPNT 27 (SUTURE) ×4 IMPLANT
SYR 50ML LL SCALE MARK (SYRINGE) ×6 IMPLANT
TIBIA STEM 5 DEG SZ E R KNEE (Knees) ×3 IMPLANT
TOWEL GREEN STERILE (TOWEL DISPOSABLE) ×3 IMPLANT
TOWEL GREEN STERILE FF (TOWEL DISPOSABLE) ×3 IMPLANT
TRAY CATH 16FR W/PLASTIC CATH (SET/KITS/TRAYS/PACK) IMPLANT
UNDERPAD 30X30 (UNDERPADS AND DIAPERS) ×3 IMPLANT
WRAP KNEE MAXI GEL POST OP (GAUZE/BANDAGES/DRESSINGS) ×3 IMPLANT

## 2020-01-01 NOTE — Care Plan (Signed)
Ortho Bundle Case Management Note  Patient Details  Name: Kathryn [name] MRN: [id number] Date of Birth: 1934-10-08    Called and spoke to patient last week prior to her upcoming Right TKA with Dr. Erlinda Hong on 01/01/20. She has a daughter that lives with her that will be able to assist after surgery. CPM and FWW delivered to her home via [employer] on Friday, prior to surgery. No other DME needs. Anticipate HHPT will be needed after short hospital stay. Referral made to [facility] notified. Of note, patient's PCP did order home health nursing for Unna wraps due to lymphedema. Discussed with MD office (Amy) as well as [place] agency. Rehab for the knee will take precedence at this time as normal transition for rehab will be 5-6 visits of home health and then progress to OPPT (which cannot have [place] and OPPT active together- patient will not be home bound at that point). MDO will send information to [facility] to hold on Unna wraps until rehab completed. Will make MD and patient aware of this information. Will follow also for increased swelling and discuss with Dr. Erlinda Hong any treatment that may be needed outside of the Ortho bundle for this patient. Will continue to follow for CM needs.              DME Arranged:  Continuous passive motion machine, Walker rolling DME Agency:  [employer]  HH Arranged:  PT HH Agency:  [facility]  Additional Comments: Please contact me with any questions of if this plan should need to change.  Jamse Arn, RN, BSN, [employer]  [phone] 01/01/2020, 11:35 AM

## 2020-01-01 NOTE — Progress Notes (Signed)
Orthopedic Tech Progress Note Patient Details:  Kathryn [name] 1935/06/25 [id number] Applied CPM to patient. Once the CPM got up to 90 degrees patient did show some grimacing, so I dropped it down to 75 and told the RN. CPM Right Knee CPM Right Knee: On Right Knee Flexion (Degrees): 0 Right Knee Extension (Degrees): 75 Additional Comments: added ice  Post Interventions Patient Tolerated: Well Instructions Provided: Care of device  Janit Pagan 01/01/2020, 12:43 PM

## 2020-01-01 NOTE — Plan of Care (Addendum)
Addendum [id number]: Temperature stabilized 98.70F  Pt's temperature is 94-48F range since PACU dropped off pt to 5N28. Lanier Clam, PA of doctor Xu notified about temperature via OR nurse. No new orders. Numerous blankets placed and heat packs to bring temp up.  Pt A&Ox4, not experiencing any other symptoms other than being cold. Will continue to monitor.   Problem: Education: Goal: Knowledge of the prescribed therapeutic regimen will improve Outcome: Progressing Goal: Individualized Educational Video(s) Outcome: Progressing   Problem: Activity: Goal: Ability to avoid complications of mobility impairment will improve Outcome: Progressing   Problem: Clinical Measurements: Goal: Postoperative complications will be avoided or minimized Outcome: Progressing   Problem: Pain Management: Goal: Pain level will decrease with appropriate interventions Outcome: Progressing   Problem: Skin Integrity: Goal: Will show signs of wound healing Outcome: Progressing

## 2020-01-01 NOTE — Transfer of Care (Signed)
Immediate Anesthesia Transfer of Care Note  Patient: Kathryn [name]  Procedure(s) Performed: RIGHT TOTAL KNEE ARTHROPLASTY (Right Knee)  Patient Location: PACU  Anesthesia Type:Spinal  Level of Consciousness: awake, alert , oriented and patient cooperative  Airway & Oxygen Therapy: Patient Spontanous Breathing and Patient connected to nasal cannula oxygen  Post-op Assessment: Report given to RN and Post -op Vital signs reviewed and stable  Post vital signs: Reviewed and stable  Last Vitals:  Vitals Value Taken Time  BP 125/76 01/01/20 1137  Temp    Pulse 69 01/01/20 1138  Resp 19 01/01/20 1138  SpO2 92 % 01/01/20 1138  Vitals shown include unvalidated device data.  Last Pain:  Vitals:   01/01/20 0807  TempSrc:   PainSc: 0-No pain      Patients Stated Pain Goal: 2 ([id number])  Complications: No apparent anesthesia complications

## 2020-01-01 NOTE — Op Note (Signed)
Total Knee Arthroplasty Procedure Note  Preoperative diagnosis: Right knee osteoarthritis  Postoperative diagnosis:same  Operative procedure: Right total knee arthroplasty. CPT [phone]  Surgeon: N. Eduard Roux, MD  Assist: Madalyn Rob, PA-C; necessary for the timely completion of procedure and due to complexity of procedure.  Anesthesia: Spinal, regional  Tourniquet time: see anesthesia record  Implants used: Zimmer Persona Femur: CR 9 Narrow Tibia: E Patella: 35 mm Polyethylene: 10 mm, MC  Indication: Kathryn [name] is a 85 y.o. year old female with a history of knee pain. Having failed conservative management, the patient elected to proceed with a total knee arthroplasty.  We have reviewed the risk and benefits of the surgery and they elected to proceed after voicing understanding.  Procedure:  After informed consent was obtained and understanding of the risk were voiced including but not limited to bleeding, infection, damage to surrounding structures including nerves and vessels, blood clots, leg length inequality and the failure to achieve desired results, the operative extremity was marked with verbal confirmation of the patient in the holding area.   The patient was then brought to the operating room and transported to the operating room table in the supine position.  A tourniquet was applied to the operative extremity around the upper thigh. The operative limb was then prepped and draped in the usual sterile fashion and preoperative antibiotics were administered.  A time out was performed prior to the start of surgery confirming the correct extremity, preoperative antibiotic administration, as well as team members, implants and instruments available for the case. Correct surgical site was also confirmed with preoperative radiographs. The limb was then elevated for exsanguination and the tourniquet was inflated. A midline incision was made and a standard medial  parapatellar approach was performed.  The patella was prepared and sized to a 35 mm.  A cover was placed on the patella for protection from retractors.  We then turned our attention to the femur. Posterior cruciate ligament was sacrificed. Start site was drilled in the femur and the intramedullary distal femoral cutting guide was placed, set at 5 degrees valgus, taking 10 mm of distal resection. The distal cut was made. Osteophytes were then removed.  Extension gap was then checked. Next, the proximal tibial cutting guide was placed with appropriate slope, varus/valgus alignment and depth of resection. The proximal tibial cut was made. Gap blocks were then used to assess the extension gap and alignment, and appropriate soft tissue releases were performed. Attention was turned back to the femur, which was sized using the sizing guide to a size 9. Appropriate rotation of the femoral component was determined using epicondylar axis, Whiteside's line, and assessing the flexion gap under ligament tension. The appropriate size 4-in-1 cutting block was placed and cuts were made. Posterior femoral osteophytes and uncapped bone were then removed with the curved osteotome.  Trial components were placed, and stability was checked in full extension, mid-flexion, and deep flexion. Proper tibial rotation was determined and marked.  The patella tracked well without a lateral release. Trial components were then removed and tibial preparation performed.  The tibia was sized for a size E component.  A posterior capsular injection comprising of 20 cc of 1.3% exparel, 20 cc of 0.25% bupivicaine with epi and 20 cc of normal saline was performed for postoperative pain control. The bony surfaces were irrigated with a pulse lavage and then dried. Bone cement was vacuum mixed on the back table, and the final components sized above were cemented  into place. After cement had finished curing, excess cement was removed. The stability of the  construct was re-evaluated throughout a range of motion and found to be acceptable. The trial liner was removed, the knee was copiously irrigated, and the knee was re-evaluated for any excess bone debris. The real polyethylene liner, 10 mm thick, was inserted and checked to ensure the locking mechanism had engaged appropriately. The tourniquet was deflated and hemostasis was achieved. The wound was irrigated with normal saline.  One gram of vancomycin powder was placed in the surgical bed.  Capsular closure was performed with a #1 vicryl, subcutaneous fat closed with a 0 vicryl suture, then subcutaneous tissue closed with interrupted 2.0 vicryl suture. The skin was then closed with a 2.0 nylon and dermabond. A sterile dressing was applied.  The patient was awakened in the operating room and taken to recovery in stable condition. All sponge, needle, and instrument counts were correct at the end of the case.  Position: supine  Complications: none.  Time Out: performed   Drains/Packing: none  Estimated blood loss: minimal  Returned to Recovery Room: in good condition.   Antibiotics: yes   Mechanical VTE (DVT) Prophylaxis: sequential compression devices, TED thigh-high  Chemical VTE (DVT) Prophylaxis: xarelto  Fluid Replacement  Crystalloid: see anesthesia record Blood: none  FFP: none   Specimens Removed: 1 to pathology   Sponge and Instrument Count Correct? yes   PACU: portable radiograph - knee AP and Lateral   Plan/RTC: Return in 2 weeks for wound check.   Weight Bearing/Load Lower Extremity: full   N. Eduard Roux, MD [facility] 10:56 AM

## 2020-01-01 NOTE — Plan of Care (Signed)
  Problem: Education: Goal: Knowledge of the prescribed therapeutic regimen will improve Outcome: Progressing Goal: Individualized Educational Video(s) Outcome: Progressing   Problem: Activity: Goal: Ability to avoid complications of mobility impairment will improve Outcome: Progressing   

## 2020-01-01 NOTE — Anesthesia Procedure Notes (Signed)
Spinal  Patient location during procedure: OR Start time: 01/01/2020 9:28 AM End time: 01/01/2020 9:36 AM Staffing Performed: anesthesiologist  Anesthesiologist: Oleta Mouse, MD Preanesthetic Checklist Completed: patient identified, IV checked, risks and benefits discussed, surgical consent, monitors and equipment checked, pre-op evaluation and timeout performed Spinal Block Patient position: sitting Prep: DuraPrep Patient monitoring: heart rate, cardiac monitor, continuous pulse ox and blood pressure Approach: midline Location: L3-4 Injection technique: single-shot Needle Needle type: Pencan  Needle gauge: 24 G Needle length: 9 cm Assessment Sensory level: T6

## 2020-01-01 NOTE — Evaluation (Signed)
Physical Therapy Evaluation Patient Details Name: Kathryn [name] MRN: [id number] DOB: 02-01-1935 Today's Date: 01/01/2020   History of Present Illness  Pt is an 85 y/o female s/p R TKA. PMH includes COPD, HTN, and PE.   Clinical Impression  Pt is s/p surgery above with deficits below. Pt with increased R knee instability, likely from nerve block, and unable to stand this session. Required min to supervision assist to perform bed mobility. Educated about knee precautions and supine HEP. Will continue to follow acutely to maximize functional mobility independence and safety.     Follow Up Recommendations Follow surgeon's recommendation for DC plan and follow-up therapies;Supervision for mobility/OOB    Equipment Recommendations  None recommended by PT    Recommendations for Other Services       Precautions / Restrictions Precautions Precautions: Knee Precaution Booklet Issued: No Precaution Comments: REviewed knee precautions with pt.  Restrictions Weight Bearing Restrictions: Yes RLE Weight Bearing: Weight bearing as tolerated      Mobility  Bed Mobility Overal bed mobility: Needs Assistance Bed Mobility: Supine to Sit;Sit to Supine     Supine to sit: Supervision;HOB elevated Sit to supine: Min assist   General bed mobility comments: Supervision and use of rails to come to sitting. Required min A for RLE assist for return to supine.   Transfers                 General transfer comment: Attempted to stand X2 using RW, however, pt with increased R knee instability and unable to perform this session.   Ambulation/Gait                Stairs            Wheelchair Mobility    Modified Rankin (Stroke Patients Only)       Balance Overall balance assessment: Needs assistance Sitting-balance support: No upper extremity supported;Feet supported Sitting balance-Leahy Scale: Fair                                        Pertinent Vitals/Pain Pain Assessment: No/denies pain    Home Living Family/patient expects to be discharged to:: Private residence Living Arrangements: Children Available Help at Discharge: Family;Available 24 hours/day Type of Home: House Home Access: Stairs to enter Entrance Stairs-Rails: None Entrance Stairs-Number of Steps: 2 Home Layout: Two level;Able to live on main level with bedroom/bathroom Home Equipment: Kasandra Knudsen - single point;Walker - 2 wheels;Other (comment)(CPM)      Prior Function Level of Independence: Independent               Hand Dominance        Extremity/Trunk Assessment   Upper Extremity Assessment Upper Extremity Assessment: Overall WFL for tasks assessed    Lower Extremity Assessment Lower Extremity Assessment: RLE deficits/detail RLE Deficits / Details: Deficits consistent with post op pain and weakness. Pt with functional weakness noted, likely secondary to nerve block.        Communication   Communication: No difficulties  Cognition Arousal/Alertness: Awake/alert Behavior During Therapy: WFL for tasks assessed/performed Overall Cognitive Status: Within Functional Limits for tasks assessed                                        General Comments      Exercises Total Joint  Exercises Ankle Circles/Pumps: AROM;Both;10 reps Quad Sets: AROM;Right;5 reps   Assessment/Plan    PT Assessment Patient needs continued PT services  PT Problem List Decreased strength;Decreased balance;Decreased activity tolerance;Decreased coordination;Decreased mobility;Decreased knowledge of precautions;Decreased knowledge of use of DME;Pain       PT Treatment Interventions Gait training;DME instruction;Stair training;Functional mobility training;Therapeutic activities;Therapeutic exercise;Balance training;Patient/family education    PT Goals (Current goals can be found in the Care Plan section)  Acute Rehab PT Goals Patient Stated  Goal: to go home PT Goal Formulation: With patient Time For Goal Achievement: 01/15/20 Potential to Achieve Goals: Good    Frequency 7X/week   Barriers to discharge        Co-evaluation               AM-PAC PT "6 Clicks" Mobility  Outcome Measure Help needed turning from your back to your side while in a flat bed without using bedrails?: A Little Help needed moving from lying on your back to sitting on the side of a flat bed without using bedrails?: A Little Help needed moving to and from a bed to a chair (including a wheelchair)?: Total Help needed standing up from a chair using your arms (e.g., wheelchair or bedside chair)?: Total Help needed to walk in hospital room?: Total Help needed climbing 3-5 steps with a railing? : Total 6 Click Score: 10    End of Session Equipment Utilized During Treatment: Gait belt Activity Tolerance: Patient tolerated treatment well Patient left: in bed;with call bell/phone within reach Nurse Communication: Mobility status PT Visit Diagnosis: Unsteadiness on feet (R26.81);Muscle weakness (generalized) (M62.81);Difficulty in walking, not elsewhere classified (R26.2)    Time: [id number] PT Time Calculation (min) (ACUTE ONLY): 28 min   Charges:   PT Evaluation $PT Eval Low Complexity: 1 Low PT Treatments $Therapeutic Activity: 8-22 mins        Kathryn [name], DPT  Acute Rehabilitation Services  Pager: [phone] Office: [phone]   Kathryn [name] 01/01/2020, 6:26 PM

## 2020-01-01 NOTE — H&P (Signed)
PREOPERATIVE H&P  Chief Complaint: right knee degenerative joint disease  HPI: Kathryn [name] is a 85 y.o. female who presents for surgical treatment of right knee degenerative joint disease.  She denies any changes in medical history.  Past Medical History:  Diagnosis Date  . Arthritis   . COPD (chronic obstructive pulmonary disease) ([place])   . Dyspnea   . Hypertension   . Hypothyroidism   . Osteopenia   . Pneumonia   . Pulmonary embolism ([place]) 04/2018  . Thyroid disease    Past Surgical History:  Procedure Laterality Date  . APPENDECTOMY    . EYE SURGERY    . TONSILLECTOMY AND ADENOIDECTOMY    . TUBAL LIGATION     Social History   Socioeconomic History  . Marital status: Divorced    Spouse name: Not on file  . Number of children: Not on file  . Years of education: Not on file  . Highest education level: Not on file  Occupational History  . Not on file  Tobacco Use  . Smoking status: Former Smoker    Quit date: 2018    Years since quitting: 3.2  . Smokeless tobacco: Never Used  Substance and Sexual Activity  . Alcohol use: Never  . Drug use: Never  . Sexual activity: Not on file  Other Topics Concern  . Not on file  Social History Narrative  . Not on file   Social Determinants of Health   Financial Resource Strain:   . Difficulty of Paying Living Expenses:   Food Insecurity:   . Worried About Charity fundraiser in the Last Year:   . Arboriculturist in the Last Year:   Transportation Needs:   . Film/video editor (Medical):   Marland Kitchen Lack of Transportation (Non-Medical):   Physical Activity:   . Days of Exercise per Week:   . Minutes of Exercise per Session:   Stress:   . Feeling of Stress :   Social Connections:   . Frequency of Communication with Friends and Family:   . Frequency of Social Gatherings with Friends and Family:   . Attends Religious Services:   . Active Member of Clubs or Organizations:   . Attends Archivist  Meetings:   Marland Kitchen Marital Status:    Family History  Problem Relation Age of Onset  . Breast cancer Mother   . Breast cancer Daughter 48   Allergies  Allergen Reactions  . Lisinopril Cough   Prior to Admission medications   Medication Sig Start Date End Date Taking? Authorizing Provider  acetaminophen (TYLENOL) 500 MG tablet Take 500-1,000 mg by mouth every 6 (six) hours as needed (for pain.).   Yes [provider]  Cholecalciferol (VITAMIN D3) 50 MCG (2000 UT) TABS Take 2,000 Units by mouth daily.   Yes [provider]  ibuprofen (ADVIL) 200 MG tablet Take 200 mg by mouth every 8 (eight) hours as needed (knee pain.).   Yes [provider]  levothyroxine (SYNTHROID, LEVOTHROID) 100 MCG tablet Take 100 mcg by mouth daily before breakfast.   Yes [provider]  losartan (COZAAR) 50 MG tablet Take 50 mg by mouth daily. 03/13/18  Yes [provider]  naproxen sodium (ALEVE) 220 MG tablet Take 220 mg by mouth 2 (two) times daily as needed (pain.).   Yes [provider]  ciprofloxacin (CIPRO) 500 MG tablet Take 1 tablet (500 mg total) by mouth daily for 3 days. 12/29/19 01/01/20  Aundra Dubin, PA-C  docusate sodium (COLACE) 100 MG capsule Take 1 capsule (100 mg total) by mouth daily as needed. 12/29/19 12/28/20  Aundra Dubin, PA-C  methocarbamol (ROBAXIN) 500 MG tablet Take 1 tablet (500 mg total) by mouth 2 (two) times daily as needed. 12/29/19   Aundra Dubin, PA-C  ondansetron (ZOFRAN) 4 MG tablet Take 1 tablet (4 mg total) by mouth every 8 (eight) hours as needed for nausea or vomiting. 12/29/19   Aundra Dubin, PA-C  oxyCODONE (OXYCONTIN) 10 mg 12 hr tablet Take 1 tablet (10 mg total) by mouth every 12 (twelve) hours. 12/29/19   Aundra Dubin, PA-C  oxyCODONE-acetaminophen (PERCOCET) 5-325 MG tablet Take 1-2 tablets by mouth every 6 (six) hours as needed for severe pain. 12/29/19   Aundra Dubin, PA-C  rivaroxaban (XARELTO) 10 MG TABS  tablet Take 1 tablet (10 mg total) by mouth daily. 12/29/19   Aundra Dubin, PA-C     Positive ROS: All other systems have been reviewed and were otherwise negative with the exception of those mentioned in the HPI and as above.  Physical Exam: General: Alert, no acute distress Cardiovascular: No pedal edema Respiratory: No cyanosis, no use of accessory musculature GI: abdomen soft Skin: No lesions in the area of chief complaint Neurologic: Sensation intact distally Psychiatric: Patient is competent for consent with normal mood and affect Lymphatic: no lymphedema  MUSCULOSKELETAL: exam stable  Assessment: right knee degenerative joint disease  Plan: Plan for Procedure(s): RIGHT TOTAL KNEE ARTHROPLASTY  The risks benefits and alternatives were discussed with the patient including but not limited to the risks of nonoperative treatment, versus surgical intervention including infection, bleeding, nerve injury,  blood clots, cardiopulmonary complications, morbidity, mortality, among others, and they were willing to proceed.   Preoperative templating of the joint replacement has been completed, documented, and submitted to the Operating Room personnel in order to optimize intra-operative equipment management.  Anticipated LOS equal to or greater than 2 midnights due to - Age 67 and older with one or more of the following:  - Obesity  - Expected need for hospital services (PT, OT, Nursing) required for safe  discharge  - Anticipated need for postoperative skilled nursing care or inpatient rehab  - Active co-morbidities: None  Eduard Roux, MD   01/01/2020 7:02 AM

## 2020-01-02 DIAGNOSIS — Z7901 Long term (current) use of anticoagulants: Secondary | ICD-10-CM | POA: Diagnosis not present

## 2020-01-02 DIAGNOSIS — M8580 Other specified disorders of bone density and structure, unspecified site: Secondary | ICD-10-CM | POA: Diagnosis present

## 2020-01-02 DIAGNOSIS — D62 Acute posthemorrhagic anemia: Secondary | ICD-10-CM | POA: Diagnosis present

## 2020-01-02 DIAGNOSIS — I10 Essential (primary) hypertension: Secondary | ICD-10-CM | POA: Diagnosis present

## 2020-01-02 DIAGNOSIS — E039 Hypothyroidism, unspecified: Secondary | ICD-10-CM | POA: Diagnosis present

## 2020-01-02 DIAGNOSIS — M1711 Unilateral primary osteoarthritis, right knee: Secondary | ICD-10-CM | POA: Diagnosis present

## 2020-01-02 DIAGNOSIS — Z803 Family history of malignant neoplasm of breast: Secondary | ICD-10-CM | POA: Diagnosis not present

## 2020-01-02 DIAGNOSIS — Z86711 Personal history of pulmonary embolism: Secondary | ICD-10-CM | POA: Diagnosis not present

## 2020-01-02 DIAGNOSIS — Z79899 Other long term (current) drug therapy: Secondary | ICD-10-CM | POA: Diagnosis not present

## 2020-01-02 DIAGNOSIS — J449 Chronic obstructive pulmonary disease, unspecified: Secondary | ICD-10-CM | POA: Diagnosis present

## 2020-01-02 DIAGNOSIS — Z87891 Personal history of nicotine dependence: Secondary | ICD-10-CM | POA: Diagnosis not present

## 2020-01-02 DIAGNOSIS — Z79890 Hormone replacement therapy (postmenopausal): Secondary | ICD-10-CM | POA: Diagnosis not present

## 2020-01-02 LAB — CBC
HCT: 34.8 % — ABNORMAL LOW (ref 36.0–46.0)
Hemoglobin: 10.7 g/dL — ABNORMAL LOW (ref 12.0–15.0)
MCH: 29.1 pg (ref 26.0–34.0)
MCHC: 30.7 g/dL (ref 30.0–36.0)
MCV: 94.6 fL (ref 80.0–100.0)
Platelets: 178 10*3/uL (ref 150–400)
RBC: 3.68 MIL/uL — ABNORMAL LOW (ref 3.87–5.11)
RDW: 14.6 % (ref 11.5–15.5)
WBC: 7.6 10*3/uL (ref 4.0–10.5)
nRBC: 0 % (ref 0.0–0.2)

## 2020-01-02 LAB — BASIC METABOLIC PANEL
Anion gap: 8 (ref 5–15)
BUN: 15 mg/dL (ref 8–23)
CO2: 26 mmol/L (ref 22–32)
Calcium: 8.4 mg/dL — ABNORMAL LOW (ref 8.9–10.3)
Chloride: 104 mmol/L (ref 98–111)
Creatinine, Ser: 1.02 mg/dL — ABNORMAL HIGH (ref 0.44–1.00)
GFR calc Af Amer: 59 mL/min — ABNORMAL LOW (ref >60)
GFR calc non Af Amer: 51 mL/min — ABNORMAL LOW (ref >60)
Glucose, Bld: 116 mg/dL — ABNORMAL HIGH (ref 70–99)
Potassium: 4.2 mmol/L (ref 3.5–5.1)
Sodium: 138 mmol/L (ref 135–145)

## 2020-01-02 NOTE — Progress Notes (Signed)
Physical Therapy Treatment Patient Details Name: Kathryn [name] MRN: [id number] DOB: 17-Oct-1934 Today's Date: 01/02/2020    History of Present Illness Pt is an 85 y/o female s/p R TKA on 12/31/19. PMH includes COPD, HTN, PE.   PT Comments    Pt progressing with mobility; denies nausea or dizziness this session. Able to perform multiple sit<>stand transfers and increase ambulation distance with RW and intermittent minA for balance due to persistent instability. Daughter present to observe session and reports pt unstable at baseline. Reviewed precautions, positioning and CPM use with pt and daughter. Will plan for continued mobility progression, including stair training during tomorrow sessions.    Follow Up Recommendations  Follow surgeon's recommendation for DC plan and follow-up therapies;Supervision for mobility/OOB;Home health PT     Equipment Recommendations  None recommended by PT    Recommendations for Other Services       Precautions / Restrictions Precautions Precautions: Knee;Fall Restrictions Weight Bearing Restrictions: Yes RLE Weight Bearing: Weight bearing as tolerated    Mobility  Bed Mobility Overal bed mobility: Needs Assistance Bed Mobility: Sit to Supine       Sit to supine: Supervision      Transfers Overall transfer level: Needs assistance Equipment used: Rolling walker (2 wheeled) Transfers: Sit to/from Stand Sit to Stand: Min guard         General transfer comment: Able to stand from recliner and BSC (over toilet) to RW with min guard for balance  Ambulation/Gait Ambulation/Gait assistance: Min guard;Min assist Gait Distance (Feet): 50 Feet Assistive device: Rolling walker (2 wheeled) Gait Pattern/deviations: Step-through pattern;Decreased stride length;Staggering right;Staggering left;Decreased weight shift to right Gait velocity: Decreased Gait velocity interpretation: <1.8 ft/sec, indicate of risk for recurrent falls General  Gait Details: Slow, unsteady gait with RW and close min guard for balance, intermittent minA due to R knee instability with pt staggering; 1x seated rest break to void in bathroom   Stairs             Wheelchair Mobility    Modified Rankin (Stroke Patients Only)       Balance Overall balance assessment: Needs assistance Sitting-balance support: No upper extremity supported;Feet supported Sitting balance-Leahy Scale: Fair       Standing balance-Leahy Scale: Poor Standing balance comment: Reliant on UE support                            Cognition Arousal/Alertness: Awake/alert Behavior During Therapy: WFL for tasks assessed/performed Overall Cognitive Status: Impaired/Different from baseline Area of Impairment: Safety/judgement;Memory;Attention;Following commands                   Current Attention Level: Selective Memory: Decreased short-term memory Following Commands: Follows multi-step commands inconsistently Safety/Judgement: Decreased awareness of deficits            Exercises      General Comments General comments (skin integrity, edema, etc.): Daughter present and supportive; educ her and pt on knee precautions, resting in extension, CPM use      Pertinent Vitals/Pain Pain Assessment: No/denies pain    Home Living                      Prior Function            PT Goals (current goals can now be found in the care plan section) Progress towards PT goals: Progressing toward goals    Frequency    7X/week  PT Plan Current plan remains appropriate    Co-evaluation              AM-PAC PT "6 Clicks" Mobility   Outcome Measure  Help needed turning from your back to your side while in a flat bed without using bedrails?: None Help needed moving from lying on your back to sitting on the side of a flat bed without using bedrails?: None Help needed moving to and from a bed to a chair (including a  wheelchair)?: A Little Help needed standing up from a chair using your arms (e.g., wheelchair or bedside chair)?: A Little Help needed to walk in hospital room?: A Little Help needed climbing 3-5 steps with a railing? : A Lot 6 Click Score: 19    End of Session Equipment Utilized During Treatment: Gait belt Activity Tolerance: Patient tolerated treatment well Patient left: in bed;with call bell/phone within reach;with bed alarm set;with family/visitor present Nurse Communication: Mobility status PT Visit Diagnosis: Unsteadiness on feet (R26.81);Muscle weakness (generalized) (M62.81);Difficulty in walking, not elsewhere classified (R26.2)     Time: [id number] PT Time Calculation (min) (ACUTE ONLY): 24 min  Charges:  $Gait Training: 8-22 mins $Therapeutic Activity: 8-22 mins                    Mabeline Caras, PT, DPT Acute Rehabilitation Services  Pager [phone] Office [place] 01/02/2020, 4:24 PM

## 2020-01-02 NOTE — Progress Notes (Signed)
Pt requested that her foley should be taken out after  Finishing her CPM morning routine and after her breakfast this morning, will update the incoming nurse.

## 2020-01-02 NOTE — Progress Notes (Signed)
Physical Therapy Treatment Patient Details Name: Kathryn [name] MRN: [id number] DOB: 1935-03-03 Today's Date: 01/02/2020    History of Present Illness Pt is an 85 y/o female s/p R TKA on 12/31/19. PMH includes COPD, HTN, PE.   PT Comments    Pt able to mobilize out of bed and walk short distance in room, very unstable requiring frequent minA to prevent LOB. Reports no pain, and moving R knee very well. Multiple bouts of nausea/vomiting during session, therefore further mobility deferred. Hopefully pt feeling better for second session this PM.    Follow Up Recommendations  Follow surgeon's recommendation for DC plan and follow-up therapies;Supervision for mobility/OOB     Equipment Recommendations  None recommended by PT    Recommendations for Other Services       Precautions / Restrictions Precautions Precautions: Knee;Fall Restrictions Weight Bearing Restrictions: Yes RLE Weight Bearing: Weight bearing as tolerated    Mobility  Bed Mobility Overal bed mobility: Needs Assistance Bed Mobility: Supine to Sit     Supine to sit: Supervision     General bed mobility comments: Encouraged pt to keep HOB flat, able to sit without assist; good RLE management  Transfers Overall transfer level: Needs assistance Equipment used: Rolling walker (2 wheeled) Transfers: Sit to/from Stand Sit to Stand: Min guard;Min assist         General transfer comment: Able to perform multiple sit<>stands to RW from EOB and recliner, cues for hand placement, intermittent cues for carryover of task to subsequent sessions; minA to maintain stability upon standing  Ambulation/Gait Ambulation/Gait assistance: Min assist Gait Distance (Feet): 5 Feet Assistive device: Rolling walker (2 wheeled)     Gait velocity interpretation: <1.8 ft/sec, indicate of risk for recurrent falls General Gait Details: Able to march in place ~15 sec x1 trials with RW and intermittent minA for balance due to  significant sway/instability; walked ~5' to chair then needing to sit with nausea/vomiting and c/o dizziness. After prolonged seated rest and vomiting, performed 1x more standing trial with marching before needing to sit again with more nausea/vomiting   Stairs             Wheelchair Mobility    Modified Rankin (Stroke Patients Only)       Balance Overall balance assessment: Needs assistance Sitting-balance support: No upper extremity supported;Feet supported Sitting balance-Leahy Scale: Fair       Standing balance-Leahy Scale: Poor Standing balance comment: Heavy reliance on BUE support, also requiring external assist                            Cognition Arousal/Alertness: Awake/alert Behavior During Therapy: WFL for tasks assessed/performed Overall Cognitive Status: No family/caregiver present to determine baseline cognitive functioning Area of Impairment: Safety/judgement;Memory;Attention                   Current Attention Level: Selective Memory: Decreased short-term memory   Safety/Judgement: Decreased awareness of deficits            Exercises      General Comments        Pertinent Vitals/Pain Pain Assessment: No/denies pain    Home Living                      Prior Function            PT Goals (current goals can now be found in the care plan section) Progress towards PT goals: Progressing  toward goals    Frequency    7X/week      PT Plan      Co-evaluation              AM-PAC PT "6 Clicks" Mobility   Outcome Measure  Help needed turning from your back to your side while in a flat bed without using bedrails?: None Help needed moving from lying on your back to sitting on the side of a flat bed without using bedrails?: A Little Help needed moving to and from a bed to a chair (including a wheelchair)?: A Little Help needed standing up from a chair using your arms (e.g., wheelchair or bedside  chair)?: A Little Help needed to walk in hospital room?: A Little Help needed climbing 3-5 steps with a railing? : A Lot 6 Click Score: 18    End of Session Equipment Utilized During Treatment: Gait belt Activity Tolerance: Patient tolerated treatment well;Treatment limited secondary to medical complications (Comment) Patient left: in chair;with call bell/phone within reach Nurse Communication: Mobility status PT Visit Diagnosis: Unsteadiness on feet (R26.81);Muscle weakness (generalized) (M62.81);Difficulty in walking, not elsewhere classified (R26.2)     Time: [id number] PT Time Calculation (min) (ACUTE ONLY): 27 min  Charges:  $Therapeutic Activity: 23-37 mins                    Mabeline Caras, PT, DPT Acute Rehabilitation Services  Pager [phone] Office [phone]  Derry Lory 01/02/2020, 11:50 AM

## 2020-01-02 NOTE — Progress Notes (Signed)
Subjective: 1 Day Post-Op Procedure(s) (LRB): RIGHT TOTAL KNEE ARTHROPLASTY (Right) Patient reports pain as mild.  Became nauseous and slightly dizzy this am soon after being placed in cpm.  Minimal pain.  No chest pain/sob.    Objective: Vital signs in last 24 hours: Temp:  [95.5 F (35.3 C)-98.4 F (36.9 C)] 97.5 F (36.4 C) (04/13 0756) Pulse Rate:  [52-76] 64 (04/13 0756) Resp:  [16-20] 17 (04/13 0756) BP: (96-155)/(53-81) 110/60 (04/13 0756) SpO2:  [90 %-97 %] 90 % (04/13 0756)  Intake/Output from previous day: 04/12 0701 - 04/13 0700 In: 1414.5 [P.O.:50; I.V.:1064.5; IV Piggyback:300] Out: 1575 [Urine:1550; Blood:25] Intake/Output this shift: No intake/output data recorded.  Recent Labs    01/02/20 0429  HGB 10.7*   Recent Labs    01/02/20 0429  WBC 7.6  RBC 3.68*  HCT 34.8*  PLT 178   Recent Labs    01/02/20 0429  NA 138  K 4.2  CL 104  CO2 26  BUN 15  CREATININE 1.02*  GLUCOSE 116*  CALCIUM 8.4*   No results for input(s): LABPT, INR in the last 72 hours.  Neurologically intact Neurovascular intact Sensation intact distally Intact pulses distally Dorsiflexion/Plantar flexion intact Incision: dressing C/D/I No cellulitis present Compartment soft   Assessment/Plan: 1 Day Post-Op Procedure(s) (LRB): RIGHT TOTAL KNEE ARTHROPLASTY (Right) Advance diet Up with therapy D/C IV fluids Discharge home with home health possibly today depending on how she feels and how she mobilizes with PT.  Please call office for d/c order if able to go home today.  WBAT RLE ABLA- mild and stable    Anticipated LOS equal to or greater than 2 midnights due to - Age 83 and older with one or more of the following:  - Obesity  - Expected need for hospital services (PT, OT, Nursing) required for safe  discharge  - Anticipated need for postoperative skilled nursing care or inpatient rehab  - Active co-morbidities: None OR   - Unanticipated findings during/Post  Surgery: Slow post-op progression: GI, pain control, mobility  - Patient is a high risk of re-admission due to: None    Aundra Dubin 01/02/2020, 8:12 AM

## 2020-01-02 NOTE — Plan of Care (Signed)
  Problem: Education: Goal: Knowledge of the prescribed therapeutic regimen will improve Outcome: Progressing Goal: Individualized Educational Video(s) Outcome: Progressing   Problem: Activity: Goal: Ability to avoid complications of mobility impairment will improve Outcome: Progressing   Problem: Clinical Measurements: Goal: Postoperative complications will be avoided or minimized Outcome: Progressing   Problem: Pain Management: Goal: Pain level will decrease with appropriate interventions Outcome: Progressing   Problem: Skin Integrity: Goal: Will show signs of wound healing Outcome: Progressing

## 2020-01-02 NOTE — Discharge Instructions (Signed)
 INSTRUCTIONS AFTER JOINT REPLACEMENT   o Remove items at home which could result in a fall. This includes throw rugs or furniture in walking pathways o ICE to the affected joint every three hours while awake for 30 minutes at a time, for at least the first 3-5 days, and then as needed for pain and swelling.  Continue to use ice for pain and swelling. You may notice swelling that will progress down to the foot and ankle.  This is normal after surgery.  Elevate your leg when you are not up walking on it.   o Continue to use the breathing machine you got in the hospital (incentive spirometer) which will help keep your temperature down.  It is common for your temperature to cycle up and down following surgery, especially at night when you are not up moving around and exerting yourself.  The breathing machine keeps your lungs expanded and your temperature down.   DIET:  As you were doing prior to hospitalization, we recommend a well-balanced diet.  DRESSING / WOUND CARE / SHOWERING  You may change your surgical dressing 7 days after surgery.  Then change the dressing every day with sterile gauze.  Please use good hand washing techniques before changing the dressing.  Do not use any lotions or creams on the incision until instructed by your surgeon.  You may shower while you have the surgical dressing which is waterproof.  After removal of surgical dressing, you must cover the incision when showering.  ACTIVITY  o Increase activity slowly as tolerated, but follow the weight bearing instructions below.   o No driving for 6 weeks or until further direction given by your physician.  You cannot drive while taking narcotics.  o No lifting or carrying greater than 10 lbs. until further directed by your surgeon. o Avoid periods of inactivity such as sitting longer than an hour when not asleep. This helps prevent blood clots.  o You may return to work once you are authorized by your doctor.     WEIGHT  BEARING   Weight bearing as tolerated with assist device (walker, cane, etc) as directed, use it as long as suggested by your surgeon or therapist, typically at least 4-6 weeks.   EXERCISES  Results after joint replacement surgery are often greatly improved when you follow the exercise, range of motion and muscle strengthening exercises prescribed by your doctor. Safety measures are also important to protect the joint from further injury. Any time any of these exercises cause you to have increased pain or swelling, decrease what you are doing until you are comfortable again and then slowly increase them. If you have problems or questions, call your caregiver or physical therapist for advice.   Rehabilitation is important following a joint replacement. After just a few days of immobilization, the muscles of the leg can become weakened and shrink (atrophy).  These exercises are designed to build up the tone and strength of the thigh and leg muscles and to improve motion. Often times heat used for twenty to thirty minutes before working out will loosen up your tissues and help with improving the range of motion but do not use heat for the first two weeks following surgery (sometimes heat can increase post-operative swelling).   These exercises can be done on a training (exercise) mat, on the floor, on a table or on a bed. Use whatever works the best and is most comfortable for you.    Use music or television   while you are exercising so that the exercises are a pleasant break in your day. This will make your life better with the exercises acting as a break in your routine that you can look forward to.   Perform all exercises about fifteen times, three times per day or as directed.  You should exercise both the operative leg and the other leg as well.  Exercises include:   . Quad Sets - Tighten up the muscle on the front of the thigh (Quad) and hold for 5-10 seconds.   . Straight Leg Raises - With your  knee straight (if you were given a brace, keep it on), lift the leg to 60 degrees, hold for 3 seconds, and slowly lower the leg.  Perform this exercise against resistance later as your leg gets stronger.  . Leg Slides: Lying on your back, slowly slide your foot toward your buttocks, bending your knee up off the floor (only go as far as is comfortable). Then slowly slide your foot back down until your leg is flat on the floor again.  . Angel Wings: Lying on your back spread your legs to the side as far apart as you can without causing discomfort.  . Hamstring Strength:  Lying on your back, push your heel against the floor with your leg straight by tightening up the muscles of your buttocks.  Repeat, but this time bend your knee to a comfortable angle, and push your heel against the floor.  You may put a pillow under the heel to make it more comfortable if necessary.   A rehabilitation program following joint replacement surgery can speed recovery and prevent re-injury in the future due to weakened muscles. Contact your doctor or a physical therapist for more information on knee rehabilitation.    CONSTIPATION  Constipation is defined medically as fewer than three stools per week and severe constipation as less than one stool per week.  Even if you have a regular bowel pattern at home, your normal regimen is likely to be disrupted due to multiple reasons following surgery.  Combination of anesthesia, postoperative narcotics, change in appetite and fluid intake all can affect your bowels.   YOU MUST use at least one of the following options; they are listed in order of increasing strength to get the job done.  They are all available over the counter, and you may need to use some, POSSIBLY even all of these options:    Drink plenty of fluids (prune juice may be helpful) and high fiber foods Colace 100 mg by mouth twice a day  Senokot for constipation as directed and as needed Dulcolax (bisacodyl), take  with full glass of water  Miralax (polyethylene glycol) once or twice a day as needed.  If you have tried all these things and are unable to have a bowel movement in the first 3-4 days after surgery call either your surgeon or your primary doctor.    If you experience loose stools or diarrhea, hold the medications until you stool forms back up.  If your symptoms do not get better within 1 week or if they get worse, check with your doctor.  If you experience "the worst abdominal pain ever" or develop nausea or vomiting, please contact the office immediately for further recommendations for treatment.   ITCHING:  If you experience itching with your medications, try taking only a single pain pill, or even half a pain pill at a time.  You can also use Benadryl over the   counter for itching or also to help with sleep.   TED HOSE STOCKINGS:  Use stockings on both legs until for at least 2 weeks or as directed by physician office. They may be removed at night for sleeping.  MEDICATIONS:  See your medication summary on the "After Visit Summary" that nursing will review with you.  You may have some home medications which will be placed on hold until you complete the course of blood thinner medication.  It is important for you to complete the blood thinner medication as prescribed.  PRECAUTIONS:  If you experience chest pain or shortness of breath - call 911 immediately for transfer to the hospital emergency department.   If you develop a fever greater that 101 F, purulent drainage from wound, increased redness or drainage from wound, foul odor from the wound/dressing, or calf pain - CONTACT YOUR SURGEON.                                                   FOLLOW-UP APPOINTMENTS:  If you do not already have a post-op appointment, please call the office for an appointment to be seen by your surgeon.  Guidelines for how soon to be seen are listed in your "After Visit Summary", but are typically between 1-4 weeks  after surgery.  OTHER INSTRUCTIONS:   Knee Replacement:  Do not place pillow under knee, focus on keeping the knee straight while resting. CPM instructions: 0-90 degrees, 2 hours in the morning, 2 hours in the afternoon, and 2 hours in the evening. Place foam block, curve side up under heel at all times except when in CPM or when walking.  DO NOT modify, tear, cut, or change the foam block in any way.  MAKE SURE YOU:  . Understand these instructions.  . Get help right away if you are not doing well or get worse.    Thank you for letting us be a part of your medical care team.  It is a privilege we respect greatly.  We hope these instructions will help you stay on track for a fast and full recovery!   Information on my medicine - ELIQUIS (apixaban)  This medication education was reviewed with me or my healthcare representative as part of my discharge preparation.   Why was Eliquis prescribed for you? Eliquis was prescribed for you to reduce the risk of blood clots forming after orthopedic surgery.    What do You need to know about Eliquis? Take your Eliquis TWICE DAILY - one tablet in the morning and one tablet in the evening with or without food.  It would be best to take the dose about the same time each day.  If you have difficulty swallowing the tablet whole please discuss with your pharmacist how to take the medication safely.  Take Eliquis exactly as prescribed by your doctor and DO NOT stop taking Eliquis without talking to the doctor who prescribed the medication.  Stopping without other medication to take the place of Eliquis may increase your risk of developing a clot.  After discharge, you should have regular check-up appointments with your healthcare provider that is prescribing your Eliquis.  What do you do if you miss a dose? If a dose of ELIQUIS is not taken at the scheduled time, take it as soon as possible on the same day and  twice-daily administration should  be resumed.  The dose should not be doubled to make up for a missed dose.  Do not take more than one tablet of ELIQUIS at the same time.  Important Safety Information A possible side effect of Eliquis is bleeding. You should call your healthcare provider right away if you experience any of the following: ? Bleeding from an injury or your nose that does not stop. ? Unusual colored urine (red or dark brown) or unusual colored stools (red or black). ? Unusual bruising for unknown reasons. ? A serious fall or if you hit your head (even if there is no bleeding).  Some medicines may interact with Eliquis and might increase your risk of bleeding or clotting while on Eliquis. To help avoid this, consult your healthcare provider or pharmacist prior to using any new prescription or non-prescription medications, including herbals, vitamins, non-steroidal anti-inflammatory drugs (NSAIDs) and supplements.  This website has more information on Eliquis (apixaban): http://www.eliquis.com/eliquis/home

## 2020-01-02 NOTE — Progress Notes (Signed)
PT Cancellation Note  Patient Details Name: Parminder Cericola MRN: [id number] DOB: 12/25/1934   Cancelled Treatment:    Reason Eval/Treat Not Completed: Medical issues which prohibited therapy. Pt limited by dizziness and nausea/vomiting this morning. Will check back for PT treatment.  Mabeline Caras, PT, DPT Acute Rehabilitation Services  Pager [phone] Office [place] 01/02/2020, 8:09 AM

## 2020-01-02 NOTE — Care Management Obs Status (Signed)
[place] NOTIFICATION   Patient Details  Name: Kathryn [name] MRN: [id number] Date of Birth: Feb 10, 1935   Medicare Observation Status Notification Given:  Yes    Carles Collet, RN 01/02/2020, 8:16 AM

## 2020-01-03 ENCOUNTER — Encounter: Payer: Self-pay | Admitting: *Deleted

## 2020-01-03 MED ORDER — CEPHALEXIN 500 MG PO CAPS: 500.0000 mg | ORAL_CAPSULE | Freq: Two times a day (BID) | ORAL | 0 refills | Status: AC

## 2020-01-03 NOTE — Plan of Care (Signed)
  Problem: Education: Goal: Knowledge of the prescribed therapeutic regimen will improve Outcome: Progressing   Problem: Activity: Goal: Ability to avoid complications of mobility impairment will improve Outcome: Progressing   Problem: Pain Management: Goal: Pain level will decrease with appropriate interventions Outcome: Progressing   

## 2020-01-03 NOTE — Progress Notes (Signed)
Subjective: 2 Days Post-Op Procedure(s) (LRB): RIGHT TOTAL KNEE ARTHROPLASTY (Right) Patient reports pain as mild.    Objective: Vital signs in last 24 hours: Temp:  [97.5 F (36.4 C)-98.1 F (36.7 C)] 98.1 F (36.7 C) (04/14 0729) Pulse Rate:  [64-76] 66 (04/14 0729) Resp:  [16-18] 17 (04/14 0729) BP: (100-140)/(60-71) 140/71 (04/14 0729) SpO2:  [90 %-95 %] 95 % (04/14 0729)  Intake/Output from previous day: 04/13 0701 - 04/14 0700 In: 910 [P.O.:910] Out: 225 [Urine:225] Intake/Output this shift: No intake/output data recorded.  Recent Labs    01/02/20 0429  HGB 10.7*   Recent Labs    01/02/20 0429  WBC 7.6  RBC 3.68*  HCT 34.8*  PLT 178   Recent Labs    01/02/20 0429  NA 138  K 4.2  CL 104  CO2 26  BUN 15  CREATININE 1.02*  GLUCOSE 116*  CALCIUM 8.4*   No results for input(s): LABPT, INR in the last 72 hours.  Neurologically intact Neurovascular intact Sensation intact distally Intact pulses distally Dorsiflexion/Plantar flexion intact Incision: dressing C/D/I No cellulitis present Compartment soft   Assessment/Plan: 2 Days Post-Op Procedure(s) (LRB): RIGHT TOTAL KNEE ARTHROPLASTY (Right) Advance diet Up with therapy Discharge home with home health after second PT session unless mobilizes well enough after first WBAT RLE     Patient's anticipated LOS is less than 2 midnights, meeting these requirements: - Younger than 31 - Lives within 1 hour of care - Has a competent adult at home to recover with post-op recover - NO history of  - Chronic pain requiring opiods  - Diabetes  - Coronary Artery Disease  - Heart failure  - Heart attack  - Stroke  - DVT/VTE  - Cardiac arrhythmia  - Respiratory Failure/COPD  - Renal failure  - Anemia  - Advanced Liver disease       Kathryn [name] 01/03/2020, 8:17 AM

## 2020-01-03 NOTE — Progress Notes (Signed)
Pt discharge as ordered, Accompanied by daughter and Arts administrator in wheelchair.. No complaints voiced. Alert/oriented in no acute distress.

## 2020-01-03 NOTE — TOC Transition Note (Signed)
Transition of Care [facility]) - CM/SW Discharge Note   Patient Details  Name: Kathryn [name] MRN: [id number] Date of Birth: 1935-01-12  [facility] CM/SW Contact:  Curlene Labrum, RN Phone Number: 01/03/2020, 8:52 AM   Clinical Narrative:     Case management met with the patient at the bedside, S/P right total knee arthroplasty by Dr. Erlinda Hong.  Patient's nausea is improved and plans to discharge tomorrow, 01/03/2020.  Patient previously set up with [facility] through Dr. Phoebe Sharps office along with DME from [place] including [place], [place] - patient declined 3:1.  After speaking with the patient at the bedside, daughters would prefer the patient to have a 3:1 as well.  [facility] called to have 3:1 delivered to the room.  Patient's PCP and pharmacy are listed and current in the chart.  Patient's family will be transporting the patient home by car upon discharge.  No other needs identified for transition to home.  Final next level of care: [place]     Patient Goals and CMS Choice Patient states their goals for this hospitalization and ongoing recovery are:: I'm ready to go home tomorrow and start feeling less nauseated. CMS Medicare.gov Compare Post Acute Care list provided to:: Patient Choice offered to / list presented to : Patient  Discharge Placement                       Discharge Plan and Services   Discharge Planning Services: CM Consult Post Acute Care Choice: Durable Medical Equipment          DME Arranged: 3-N-1(Patient received CPM and RW from [facility] - 3:1 from [facility] DME Agency: [facility] Date DME Agency Contacted: 01/02/20 Time DME Agency Contacted: [id number] Representative spoke with at DME Agency: Thedore Mins - ordered 3:1,  CPM and RW previously arranged by Dr. Phoebe Sharps office. HH Arranged: PT HH Agency: [place] ([facility])     Representative spoke with at [place]: Confirmed that patient previously set [facility] with [facility]  Social Determinants of Health (SDOH) Interventions     Readmission Risk Interventions No flowsheet data found.

## 2020-01-03 NOTE — Discharge Summary (Signed)
Patient ID: Kathryn [name] MRN: [id number] DOB/AGE: [birth year] 83 y.o.  Admit date: 01/01/2020 Discharge date: 01/03/2020  Admission Diagnoses:  Principal Problem:   Primary osteoarthritis of right knee Active Problems:   Status post total right knee replacement   Discharge Diagnoses:  Same  Past Medical History:  Diagnosis Date  . Arthritis   . COPD (chronic obstructive pulmonary disease) ([place])   . Dyspnea   . Hypertension   . Hypothyroidism   . Osteopenia   . Pneumonia   . Pulmonary embolism ([place]) 04/2018  . Thyroid disease     Surgeries: Procedure(s): RIGHT TOTAL KNEE ARTHROPLASTY on 01/01/2020   Consultants:   Discharged Condition: Improved  Hospital Course: Kathryn [name] is an 85 y.o. female who was admitted 01/01/2020 for operative treatment ofPrimary osteoarthritis of right knee. Patient has severe unremitting pain that affects sleep, daily activities, and work/hobbies. After pre-op clearance the patient was taken to the operating room on 01/01/2020 and underwent  Procedure(s): RIGHT TOTAL KNEE ARTHROPLASTY.    Patient was given perioperative antibiotics:  Anti-infectives (From admission, onward)   Start     Dose/Rate Route Frequency Ordered Stop   01/01/20 1600  ceFAZolin (ANCEF) IVPB 2g/100 mL premix     2 g 200 mL/hr over 30 Minutes Intravenous Every 8 hours 01/01/20 1317 01/02/20 0147   01/01/20 1330  ciprofloxacin (CIPRO) tablet 500 mg  Status:  Discontinued     500 mg Oral Daily 01/01/20 1317 01/01/20 1324   01/01/20 0745  ceFAZolin (ANCEF) IVPB 2g/100 mL premix     2 g 200 mL/hr over 30 Minutes Intravenous On call to O.R. 01/01/20 0735 01/01/20 0933   01/01/20 0000  sulfamethoxazole-trimethoprim (BACTRIM DS) 800-160 MG tablet     1 tablet Oral 2 times daily 01/01/20 0910         Patient was given sequential compression devices, early ambulation, and chemoprophylaxis to prevent DVT.  Patient benefited maximally from hospital stay and  there were no complications.    Recent vital signs:  Patient Vitals for the past 24 hrs:  BP Temp Temp src Pulse Resp SpO2  01/03/20 0729 140/71 98.1 F (36.7 C) Oral 66 17 95 %  01/03/20 0509 112/60 97.8 F (36.6 C) Oral 70 16 91 %  01/02/20 2024 100/60 (!) 97.5 F (36.4 C) Oral 76 18 93 %  01/02/20 1657 131/66 97.7 F (36.5 C) Oral 66 18 --  01/02/20 1200 136/64 (!) 97.5 F (36.4 C) Oral 64 16 90 %     Recent laboratory studies:  Recent Labs    01/02/20 0429  WBC 7.6  HGB 10.7*  HCT 34.8*  PLT 178  NA 138  K 4.2  CL 104  CO2 26  BUN 15  CREATININE 1.02*  GLUCOSE 116*  CALCIUM 8.4*     Discharge Medications:   Allergies as of 01/03/2020      Reactions   Lisinopril Cough      Medication List    STOP taking these medications   acetaminophen 500 MG tablet Commonly known as: TYLENOL   ibuprofen 200 MG tablet Commonly known as: ADVIL   naproxen sodium 220 MG tablet Commonly known as: ALEVE     TAKE these medications   docusate sodium 100 MG capsule Commonly known as: Colace Take 1 capsule (100 mg total) by mouth daily as needed.   levothyroxine 100 MCG tablet Commonly known as: SYNTHROID Take 100 mcg by mouth daily before breakfast.   losartan 50 MG  tablet Commonly known as: COZAAR Take 50 mg by mouth daily.   methocarbamol 500 MG tablet Commonly known as: Robaxin Take 1 tablet (500 mg total) by mouth 2 (two) times daily as needed.   ondansetron 4 MG tablet Commonly known as: Zofran Take 1 tablet (4 mg total) by mouth every 8 (eight) hours as needed for nausea or vomiting.   oxyCODONE 10 mg 12 hr tablet Commonly known as: OXYCONTIN Take 1 tablet (10 mg total) by mouth every 12 (twelve) hours.   oxyCODONE-acetaminophen 5-325 MG tablet Commonly known as: Percocet Take 1-2 tablets by mouth every 6 (six) hours as needed for severe pain.   rivaroxaban 10 MG Tabs tablet Commonly known as: XARELTO Take 1 tablet (10 mg total) by mouth daily.    sulfamethoxazole-trimethoprim 800-160 MG tablet Commonly known as: BACTRIM DS Take 1 tablet by mouth 2 (two) times daily.   Vitamin D3 50 MCG (2000 UT) Tabs Take 2,000 Units by mouth daily.            Durable Medical Equipment  (From admission, onward)         Start     Ordered   01/01/20 1317  DME Walker rolling  Once    Question:  Patient needs a walker to treat with the following condition  Answer:  Total knee replacement status   01/01/20 1317   01/01/20 1317  DME 3 n 1  Once     01/01/20 1317   01/01/20 1317  DME Bedside commode  Once    Question:  Patient needs a bedside commode to treat with the following condition  Answer:  Total knee replacement status   01/01/20 1317          Diagnostic Studies: DG Chest 2 View  Result Date: 12/28/2019 CLINICAL DATA:  Preop for right knee replacement EXAM: CHEST - 2 VIEW COMPARISON:  05/24/2019 FINDINGS: The lungs are hyperinflated with diffuse interstitial prominence. No focal airspace consolidation or pulmonary edema. No pleural effusion or pneumothorax. Normal cardiomediastinal contours. Nodular opacity previously visualized in the left mid lung has resolved. IMPRESSION: COPD without acute airspace disease. Electronically Signed   By: Ulyses Jarred M.D.   On: 12/28/2019 17:47   DG Knee Right Port  Result Date: 01/01/2020 CLINICAL DATA:  Postop total knee replacement EXAM: PORTABLE RIGHT KNEE - 1-2 VIEW COMPARISON:  12/06/2018 FINDINGS: Changes of right knee replacement. Soft tissue and joint space gas noted. No hardware bony complicating feature. IMPRESSION: Right knee replacement.  No visible complicating feature. Electronically Signed   By: Rolm Baptise M.D.   On: 01/01/2020 12:27    Disposition: Discharge disposition: 01-Home or Self Care         Follow-up Information    Leandrew Koyanagi, MD. Go on 01/16/2020.   Specialty: Orthopedic Surgery Why: at 10:45 am for your 2 week post op appointment with Dr. Sherilyn Cooter  information: [place] [id number] [place] Follow up.   Why: Someone from the home health agency will be in contact with you after discharge from the hospital to arrange your initial in home therapy appointment. Contact information: [place] [phone]           Signed: Aundra Dubin 01/03/2020, 8:19 AM

## 2020-01-03 NOTE — Progress Notes (Signed)
Discharge summary printed and given to pt with explaination. Education has been provided. All questions answered. Per pt  Her daughter will be responsible for her transport.

## 2020-01-03 NOTE — Plan of Care (Signed)
  Problem: Activity: Goal: Ability to avoid complications of mobility impairment will improve Outcome: Progressing Goal: Range of joint motion will improve Outcome: Progressing   Problem: Clinical Measurements: Goal: Postoperative complications will be avoided or minimized Outcome: Progressing   

## 2020-01-04 ENCOUNTER — Encounter: Payer: Self-pay | Admitting: Anesthesiology

## 2020-01-04 ENCOUNTER — Telehealth: Payer: Self-pay | Admitting: *Deleted

## 2020-01-04 DIAGNOSIS — E890 Postprocedural hypothyroidism: Secondary | ICD-10-CM | POA: Diagnosis not present

## 2020-01-04 DIAGNOSIS — Z86711 Personal history of pulmonary embolism: Secondary | ICD-10-CM | POA: Diagnosis not present

## 2020-01-04 DIAGNOSIS — Z8701 Personal history of pneumonia (recurrent): Secondary | ICD-10-CM | POA: Diagnosis not present

## 2020-01-04 DIAGNOSIS — Z471 Aftercare following joint replacement surgery: Secondary | ICD-10-CM | POA: Diagnosis not present

## 2020-01-04 DIAGNOSIS — M1990 Unspecified osteoarthritis, unspecified site: Secondary | ICD-10-CM | POA: Diagnosis not present

## 2020-01-04 DIAGNOSIS — M8580 Other specified disorders of bone density and structure, unspecified site: Secondary | ICD-10-CM | POA: Diagnosis not present

## 2020-01-04 DIAGNOSIS — Z79891 Long term (current) use of opiate analgesic: Secondary | ICD-10-CM | POA: Diagnosis not present

## 2020-01-04 DIAGNOSIS — M4692 Unspecified inflammatory spondylopathy, cervical region: Secondary | ICD-10-CM | POA: Diagnosis not present

## 2020-01-04 DIAGNOSIS — Z6829 Body mass index (BMI) 29.0-29.9, adult: Secondary | ICD-10-CM | POA: Diagnosis not present

## 2020-01-04 DIAGNOSIS — E669 Obesity, unspecified: Secondary | ICD-10-CM | POA: Diagnosis not present

## 2020-01-04 DIAGNOSIS — Z96651 Presence of right artificial knee joint: Secondary | ICD-10-CM | POA: Diagnosis not present

## 2020-01-04 DIAGNOSIS — Z7901 Long term (current) use of anticoagulants: Secondary | ICD-10-CM | POA: Diagnosis not present

## 2020-01-04 DIAGNOSIS — Z87891 Personal history of nicotine dependence: Secondary | ICD-10-CM | POA: Diagnosis not present

## 2020-01-04 DIAGNOSIS — J449 Chronic obstructive pulmonary disease, unspecified: Secondary | ICD-10-CM | POA: Diagnosis not present

## 2020-01-04 DIAGNOSIS — I10 Essential (primary) hypertension: Secondary | ICD-10-CM | POA: Diagnosis not present

## 2020-01-04 NOTE — Anesthesia Postprocedure Evaluation (Signed)
Anesthesia Post Note  Patient: [employer]  Procedure(s) Performed: RIGHT TOTAL KNEE ARTHROPLASTY (Right Knee)     Patient location during evaluation: PACU Anesthesia Type: Regional, MAC and Spinal Level of consciousness: awake and alert Pain management: pain level controlled Vital Signs Assessment: post-procedure vital signs reviewed and stable Respiratory status: spontaneous breathing, nonlabored ventilation, respiratory function stable and patient connected to nasal cannula oxygen Cardiovascular status: stable and blood pressure returned to baseline Postop Assessment: no apparent nausea or vomiting and spinal receding Anesthetic complications: no    Last Vitals:  Vitals:   01/03/20 0509 01/03/20 0729  BP: 112/60 140/71  Pulse: 70 66  Resp: 16 17  Temp: 36.6 C 36.7 C  SpO2: 91% 95%    Last Pain:  Vitals:   01/03/20 0805  TempSrc:   PainSc: 0-No pain                 Briscoe Daniello

## 2020-01-04 NOTE — Care Plan (Signed)
Ortho bundle D/C call to patient. Spoke with patient's daughter, who states they are doing well today. Pain is worse, but being managed with pain medication. Still nauseated and has taken Zofran as needed. Discussed constipation and adding in Dulcolax possibly for bowel movements. Also asked about antibiotic prescribed yesterday prior to d/c. Daughter states they picked up a prescription for Bactrim, but unsure if this is what they are supposed to be taking as it was changed multiple times prior to discharge. Will ask Dr. Erlinda Hong if this is correct and get back with daughter. Reminded to call with any other questions.

## 2020-01-04 NOTE — Telephone Encounter (Signed)
Ortho bundle discharge call completed.

## 2020-01-05 ENCOUNTER — Telehealth: Payer: Self-pay | Admitting: *Deleted

## 2020-01-05 DIAGNOSIS — M1990 Unspecified osteoarthritis, unspecified site: Secondary | ICD-10-CM | POA: Diagnosis not present

## 2020-01-05 DIAGNOSIS — Z96651 Presence of right artificial knee joint: Secondary | ICD-10-CM | POA: Diagnosis not present

## 2020-01-05 DIAGNOSIS — M4692 Unspecified inflammatory spondylopathy, cervical region: Secondary | ICD-10-CM | POA: Diagnosis not present

## 2020-01-05 DIAGNOSIS — I10 Essential (primary) hypertension: Secondary | ICD-10-CM | POA: Diagnosis not present

## 2020-01-05 DIAGNOSIS — J449 Chronic obstructive pulmonary disease, unspecified: Secondary | ICD-10-CM | POA: Diagnosis not present

## 2020-01-05 DIAGNOSIS — Z471 Aftercare following joint replacement surgery: Secondary | ICD-10-CM | POA: Diagnosis not present

## 2020-01-05 NOTE — Telephone Encounter (Signed)
CM call to patient.

## 2020-01-05 NOTE — Care Plan (Signed)
RNCM verified with Dr. Erlinda Hong instructions regarding home antibiotic that patient was sent home with. Informed that when CM spoke with daughter late yesterday afternoon, the daughter reported that she was contacted by someone at the hospital who instructed her to only take 1/2 of the dose of Bactrim for 5 days instead of 10. CM contacted hospital pharmacist Benetta Spar, [facility] and verified this information. She verbalized that patient had discharged and had already picked up Bactrim, so began taking this...after realizing lab values of serum creatinine, pharmacist contacted daughter and requested to switch to Keflex instead. Daughter refused to pick up new antibiotic and pharmacist informed to only take x 5 days instead of 10 of the Bactrim. Spoke with pharmacist who verified today. Call to patient's daughter and reinforced these instructions. Daughter states her mother is feeling better today, but has been nauseated from pain medication she believes. She is now only taking Tylenol scheduled for pain. Will continue to follow for needs. Update Dr. Erlinda Hong and his PA on above.

## 2020-01-08 ENCOUNTER — Telehealth: Payer: Self-pay

## 2020-01-08 DIAGNOSIS — Z471 Aftercare following joint replacement surgery: Secondary | ICD-10-CM | POA: Diagnosis not present

## 2020-01-08 DIAGNOSIS — M4692 Unspecified inflammatory spondylopathy, cervical region: Secondary | ICD-10-CM | POA: Diagnosis not present

## 2020-01-08 DIAGNOSIS — I10 Essential (primary) hypertension: Secondary | ICD-10-CM | POA: Diagnosis not present

## 2020-01-08 DIAGNOSIS — Z96651 Presence of right artificial knee joint: Secondary | ICD-10-CM | POA: Diagnosis not present

## 2020-01-08 DIAGNOSIS — J449 Chronic obstructive pulmonary disease, unspecified: Secondary | ICD-10-CM | POA: Diagnosis not present

## 2020-01-08 DIAGNOSIS — M1990 Unspecified osteoarthritis, unspecified site: Secondary | ICD-10-CM | POA: Diagnosis not present

## 2020-01-08 MED ORDER — MINERAL OIL RE ENEM: 1.0000 | ENEMA | Freq: Once | RECTAL | 3 refills | Status: AC

## 2020-01-08 MED ORDER — GLYCERIN (ADULT) 2 G RE SUPP: 1.0000 | RECTAL | 0 refills | Status: DC | PRN

## 2020-01-08 NOTE — Telephone Encounter (Signed)
Spoke with daughter.  Sent in enema and suppository.  Instructed them to go to ER for evaluation if she does not have a BM with these meds.

## 2020-01-08 NOTE — Telephone Encounter (Signed)
Patients daughter Santiago Glad) called and states patient has not done Bowel Movement since Surgery date 01/01/2020. Tried many different laxatives and nothing seems to help. Patient now does not want to eat. Would like to know what she can do. Would like to speak to Dr. Erlinda Hong. She is worried.     CB [phone]

## 2020-01-10 ENCOUNTER — Telehealth: Payer: Self-pay | Admitting: *Deleted

## 2020-01-10 DIAGNOSIS — Z96651 Presence of right artificial knee joint: Secondary | ICD-10-CM | POA: Diagnosis not present

## 2020-01-10 DIAGNOSIS — M4692 Unspecified inflammatory spondylopathy, cervical region: Secondary | ICD-10-CM | POA: Diagnosis not present

## 2020-01-10 DIAGNOSIS — I10 Essential (primary) hypertension: Secondary | ICD-10-CM | POA: Diagnosis not present

## 2020-01-10 DIAGNOSIS — J449 Chronic obstructive pulmonary disease, unspecified: Secondary | ICD-10-CM | POA: Diagnosis not present

## 2020-01-10 DIAGNOSIS — Z471 Aftercare following joint replacement surgery: Secondary | ICD-10-CM | POA: Diagnosis not present

## 2020-01-10 DIAGNOSIS — M1990 Unspecified osteoarthritis, unspecified site: Secondary | ICD-10-CM | POA: Diagnosis not present

## 2020-01-10 NOTE — Care Plan (Signed)
RNCM call to patient to check status at 1 week post op. Patient is 9 days post op today. Made note that CM attempted to reach her earlier in the week, but the phone number would not go through. Patient states her daughter did call into office and wanted to speak with Dr. Erlinda Hong directly as she had not had a BM since surgery date. She did get the prescribed suppository and has had success with now having regular bowel movements. She verbalized therapy was walking in as CM was on the phone and she felt she was doing very well. Emphasized to reach out to CM with questions/concerns or needs.

## 2020-01-10 NOTE — Telephone Encounter (Signed)
Ortho bundle call completed. Patient verbalized success with having BMs after calling into office with problems this week.

## 2020-01-12 DIAGNOSIS — Z471 Aftercare following joint replacement surgery: Secondary | ICD-10-CM | POA: Diagnosis not present

## 2020-01-12 DIAGNOSIS — M1990 Unspecified osteoarthritis, unspecified site: Secondary | ICD-10-CM | POA: Diagnosis not present

## 2020-01-12 DIAGNOSIS — Z96651 Presence of right artificial knee joint: Secondary | ICD-10-CM | POA: Diagnosis not present

## 2020-01-12 DIAGNOSIS — J449 Chronic obstructive pulmonary disease, unspecified: Secondary | ICD-10-CM | POA: Diagnosis not present

## 2020-01-12 DIAGNOSIS — M4692 Unspecified inflammatory spondylopathy, cervical region: Secondary | ICD-10-CM | POA: Diagnosis not present

## 2020-01-12 DIAGNOSIS — I10 Essential (primary) hypertension: Secondary | ICD-10-CM | POA: Diagnosis not present

## 2020-01-15 DIAGNOSIS — J449 Chronic obstructive pulmonary disease, unspecified: Secondary | ICD-10-CM | POA: Diagnosis not present

## 2020-01-15 DIAGNOSIS — Z471 Aftercare following joint replacement surgery: Secondary | ICD-10-CM | POA: Diagnosis not present

## 2020-01-15 DIAGNOSIS — M4692 Unspecified inflammatory spondylopathy, cervical region: Secondary | ICD-10-CM | POA: Diagnosis not present

## 2020-01-15 DIAGNOSIS — M1990 Unspecified osteoarthritis, unspecified site: Secondary | ICD-10-CM | POA: Diagnosis not present

## 2020-01-15 DIAGNOSIS — Z96651 Presence of right artificial knee joint: Secondary | ICD-10-CM | POA: Diagnosis not present

## 2020-01-15 DIAGNOSIS — I10 Essential (primary) hypertension: Secondary | ICD-10-CM | POA: Diagnosis not present

## 2020-01-16 ENCOUNTER — Ambulatory Visit (INDEPENDENT_AMBULATORY_CARE_PROVIDER_SITE_OTHER): Payer: Medicare Other | Admitting: Physician Assistant

## 2020-01-16 ENCOUNTER — Telehealth: Payer: Self-pay | Admitting: *Deleted

## 2020-01-16 ENCOUNTER — Other Ambulatory Visit: Payer: Self-pay | Admitting: *Deleted

## 2020-01-16 ENCOUNTER — Encounter: Payer: Self-pay | Admitting: Orthopaedic Surgery

## 2020-01-16 ENCOUNTER — Other Ambulatory Visit: Payer: Self-pay

## 2020-01-16 DIAGNOSIS — Z96651 Presence of right artificial knee joint: Secondary | ICD-10-CM

## 2020-01-16 DIAGNOSIS — M1711 Unilateral primary osteoarthritis, right knee: Secondary | ICD-10-CM

## 2020-01-16 MED ORDER — OXYCODONE-ACETAMINOPHEN 5-325 MG PO TABS: 1.0000 | ORAL_TABLET | Freq: Three times a day (TID) | ORAL | 0 refills | Status: DC | PRN

## 2020-01-16 NOTE — Progress Notes (Signed)
Post-Op Visit Note   Patient: Kathryn [name]           Date of Birth: 13-Jul-1935           MRN: [id number] Visit Date: 01/16/2020 PCP: Mayra Neer, MD   Assessment & Plan:  Chief Complaint:  Chief Complaint  Patient presents with  . Right Knee - Pain   Visit Diagnoses:  1. S/P revision of total knee, right     Plan: Patient is a pleasant 85 year old female who comes in today 2 weeks out right total knee replacement for 4 1221.  She has been doing well.  She has had mild to moderate pain worse at night.  No fevers or chills.  She has not been wearing her compression socks due to difficulty putting them on.  She does have a history of PE and is currently on Xarelto.  Semination of her right knee reveals a well-healing surgical incision with nylon sutures in place.  No evidence of infection or cellulitis.  She does have moderate swelling throughout the right lower extremity.  Calf is soft and nontender.  Negative Homans.  She is neurovascular intact distally.  At this point, we really reinforced the need to wear compression socks but given her inability to put them on I am not convinced that she will be able to wear them.  We will look into SCDs.  In the meantime, she is transitioning from home health physical therapy to an outpatient physical therapy setting.  Today, sutures were removed and Steri-Strips applied.  Dental prophylaxis reinforced.  Follow-up with [place] in 4 weeks time for repeat evaluation and 2 view x-rays of the right knee.  Call with concerns or questions in the meantime.  Follow-Up Instructions: Return in about 4 weeks (around 02/13/2020).   Orders:  No orders of the defined types were placed in this encounter.  Meds ordered this encounter  Medications  . oxyCODONE-acetaminophen (PERCOCET) 5-325 MG tablet    Sig: Take 1-2 tablets by mouth 3 (three) times daily as needed for severe pain.    Dispense:  30 tablet    Refill:  0    Imaging: No new  imaging  PMFS History: Patient Active Problem List   Diagnosis Date Noted  . Status post total right knee replacement 01/01/2020  . Primary osteoarthritis of right knee 11/14/2019  . Neck pain 12/06/2018  . Chronic pain of right knee 08/07/2016   Past Medical History:  Diagnosis Date  . Arthritis   . COPD (chronic obstructive pulmonary disease) ([place])   . Dyspnea   . Hypertension   . Hypothyroidism   . Osteopenia   . Pneumonia   . Pulmonary embolism ([place]) 04/2018  . Thyroid disease     Family History  Problem Relation Age of Onset  . Breast cancer Mother   . Breast cancer Daughter 43    Past Surgical History:  Procedure Laterality Date  . APPENDECTOMY    . EYE SURGERY    . TONSILLECTOMY AND ADENOIDECTOMY    . TOTAL KNEE ARTHROPLASTY Right 01/01/2020   Procedure: RIGHT TOTAL KNEE ARTHROPLASTY;  Surgeon: Leandrew Koyanagi, MD;  Location: [facility];  Service: Orthopedics;  Laterality: Right;  . TUBAL LIGATION     Social History   Occupational History  . Not on file  Tobacco Use  . Smoking status: Former Smoker    Quit date: 2018    Years since quitting: 3.3  . Smokeless tobacco: Never Used  Substance and  Sexual Activity  . Alcohol use: Never  . Drug use: Never  . Sexual activity: Not on file

## 2020-01-16 NOTE — Care Plan (Signed)
RNCM met with patient and her daughter during her 2 week in office post-op visit. Sutures removed today and steri-strips applied. Incision is C/D/I without any signs of infection. She verbalized she was doing well overall, but has had issues with staying ahead of her pain. Refill of pain medication provided by PA today. Discussed swelling of lower extremities and importance of wearing compression hose. Client having much difficulty getting these on/off. Prior to surgery, patient's PCP had ordered Unna boot wraps for lower extremity swelling, but this was delayed due to knee surgery and anticipated need for OPPT, which would conflict with [facility] skilled nursing needed for unna wraps in home. In home sequential compression devices were discussed and if CM could get through DME company for assistance with swelling /edema. Reached out to [employer] rep, Vicente Males, and discuss their product used for lymphedema. He will reach out to PCP for medical documentation after receiving demographic/insurance information. He states he can come to patient's home for demo of the product if needed. Updated patient's PCP office as well with regards to in home device being possible and informed someone from [employer] may be reaching out to them. OPPT ordered and scheduled to begin at [facility] at [facility] on 01/25/20 at 11:00 am. Patient aware.

## 2020-01-16 NOTE — Telephone Encounter (Signed)
Ortho bundle 14 day in office visit completed. 

## 2020-01-18 DIAGNOSIS — M1990 Unspecified osteoarthritis, unspecified site: Secondary | ICD-10-CM | POA: Diagnosis not present

## 2020-01-18 DIAGNOSIS — J449 Chronic obstructive pulmonary disease, unspecified: Secondary | ICD-10-CM | POA: Diagnosis not present

## 2020-01-18 DIAGNOSIS — Z96651 Presence of right artificial knee joint: Secondary | ICD-10-CM | POA: Diagnosis not present

## 2020-01-18 DIAGNOSIS — I10 Essential (primary) hypertension: Secondary | ICD-10-CM | POA: Diagnosis not present

## 2020-01-18 DIAGNOSIS — M4692 Unspecified inflammatory spondylopathy, cervical region: Secondary | ICD-10-CM | POA: Diagnosis not present

## 2020-01-18 DIAGNOSIS — Z471 Aftercare following joint replacement surgery: Secondary | ICD-10-CM | POA: Diagnosis not present

## 2020-01-25 ENCOUNTER — Encounter: Payer: Self-pay | Admitting: Physical Therapy

## 2020-01-25 ENCOUNTER — Ambulatory Visit: Payer: Medicare Other | Attending: Orthopaedic Surgery | Admitting: Physical Therapy

## 2020-01-25 ENCOUNTER — Other Ambulatory Visit: Payer: Self-pay

## 2020-01-25 DIAGNOSIS — R2689 Other abnormalities of gait and mobility: Secondary | ICD-10-CM

## 2020-01-25 DIAGNOSIS — M6281 Muscle weakness (generalized): Secondary | ICD-10-CM | POA: Insufficient documentation

## 2020-01-25 DIAGNOSIS — M25661 Stiffness of right knee, not elsewhere classified: Secondary | ICD-10-CM | POA: Diagnosis not present

## 2020-01-25 DIAGNOSIS — R600 Localized edema: Secondary | ICD-10-CM | POA: Insufficient documentation

## 2020-01-25 DIAGNOSIS — M25561 Pain in right knee: Secondary | ICD-10-CM | POA: Diagnosis not present

## 2020-01-25 NOTE — Patient Instructions (Signed)
Access Code: [email or web address] URL: [email or web address] Date: 01/25/2020 Prepared by: Almyra Free Wyat Infinger  Exercises Supine Ankle Pumps - 1 x daily - 7 x weekly - 15 reps - 3 sets Supine Quadricep Sets - 2 x daily - 7 x weekly - 1 sets - 15 reps - 5 sec hold Supine Short Arc Quad - 2 x daily - 7 x weekly - 1 sets - 15 reps - 5 sec hold Supine Heel Slides - 2 x daily - 7 x weekly - 1 sets - 15 reps Seated Long Arc Quad - 2 x daily - 7 x weekly - 1 sets - 15 reps Seated Knee Flexion Stretch - 2 x daily - 7 x weekly - 3 sets - 15 reps - 5-10 sec hold  Patient Education Total Knee Replacement Handout

## 2020-01-25 NOTE — Therapy (Signed)
[facility] [address], [place], [place], [address] Phone: [phone]   Fax:  [phone]  Physical Therapy Evaluation  Patient Details  Name: Kathryn [name] MRN: [id number] Date of Birth: 1935/07/17 Referring Provider (PT): Frankey Shown   Encounter Date: 01/25/2020  PT End of Session - 01/25/20 1059    Visit Number  1    Date for PT Re-Evaluation  03/21/20    Authorization Type  MCR/MCD    Progress Note Due on Visit  10    PT Start Time  1100    PT Stop Time  1151    PT Time Calculation (min)  51 min    Activity Tolerance  Patient tolerated treatment well    Behavior During Therapy  [facility] for tasks assessed/performed       Past Medical History:  Diagnosis Date  . Arthritis   . COPD (chronic obstructive pulmonary disease) ([place])   . Dyspnea   . Hypertension   . Hypothyroidism   . Osteopenia   . Pneumonia   . Pulmonary embolism ([place]) 04/2018  . Thyroid disease     Past Surgical History:  Procedure Laterality Date  . APPENDECTOMY    . EYE SURGERY    . TONSILLECTOMY AND ADENOIDECTOMY    . TOTAL KNEE ARTHROPLASTY Right 01/01/2020   Procedure: RIGHT TOTAL KNEE ARTHROPLASTY;  Surgeon: Leandrew Koyanagi, MD;  Location: [place];  Service: Orthopedics;  Laterality: Right;  . TUBAL LIGATION      There were no vitals filed for this visit.   Subjective Assessment - 01/25/20 1104    Subjective  Patient had a right TKA 01/01/20. Main complaint is swelling and it feels like a tree trunk/numb.  Her bedroom is upstairs and she would like to get back up there. There is also one step to get in the house. Walks in the house 2-4 times per day. She is trying to wean herself off the walker.    Pertinent History  HTN, COPD, neck pain    Patient Stated Goals  to get back upstairs    Currently in Pain?  Yes    Pain Score  2     Pain Location  Knee    Pain Orientation  Right    Pain Descriptors / Indicators  Aching    Pain  Type  Surgical pain    Pain Onset  1 to 4 weeks ago    Pain Frequency  Constant    Aggravating Factors   working it    Pain Relieving Factors  ice real often elevation         [facility] PT Assessment - 01/25/20 0001      Assessment   Medical Diagnosis  primary OA Rt knee; s/p Rt TKA    Referring Provider (PT)  Naiping Xu    Onset Date/Surgical Date  01/01/20    Next MD Visit  02/13/20    Prior Therapy  HHPT 2 weeks      Precautions   Precautions  None      Restrictions   Weight Bearing Restrictions  No      Balance Screen   Has the patient fallen in the past 6 months  No    Has the patient had a decrease in activity level because of a fear of falling?   No    Is the patient reluctant to leave their home because of a fear of falling?   No  Home Environment   Living Environment  Private residence    Living Arrangements  Children    Type of [place] to enter    Entrance Stairs-Number of Steps  1    Entrance Stairs-Rails  None    Home Layout  Two level    Alternate Level Stairs-Number of Steps  13    Alternate Level Stairs-Rails  Left    Home Equipment  Walker - 4 wheels;Kasandra Knudsen - single point      Prior Function   Level of Independence  Independent    Vocation  Retired      Observation/Other Assessments   Focus on Therapeutic Outcomes (FOTO)   57% limited (44% goal)      Observation/Other Assessments-Edema    Edema  Circumferential   swelling present in right lower leg and ankle as well     Circumferential Edema   Circumferential - Right  46.5 cm    Circumferential - Left   42 cm      Posture/Postural Control   Posture Comments  decreased weight through Rt LE      ROM / Strength   AROM / PROM / Strength  AROM;PROM;Strength      AROM   AROM Assessment Site  Knee    Right/Left Knee  Right    Right Knee Extension  -5    Right Knee Flexion  95      PROM   PROM Assessment Site  Knee    Right/Left Knee  Right    Right Knee Extension   0    Right Knee Flexion  117      Strength   Overall Strength Comments  Bil hip flex 4-/5, seated hip ABD/ADD 5/5,  unable to lift leg onto table    Strength Assessment Site  Knee    Right/Left Knee  Right    Right Knee Flexion  4-/5    Right Knee Extension  4+/5      Palpation   Patella mobility  WNL    Palpation comment  right distal ITB; gastroc tender      Ambulation/Gait   Ambulation/Gait  Yes    Ambulation/Gait Assistance  6: Modified independent (Device/Increase time)    Ambulation Distance (Feet)  40 Feet    Assistive device  Rolling walker    Gait Pattern  Decreased stance time - right;Decreased step length - right;Decreased step length - left;Step-through pattern    Ambulation Surface  Level    Gait Comments  right hip externally rotates; good heel toe bil                Objective measurements completed on examination: See above findings.      [facility] Adult PT Treatment/Exercise - 01/25/20 0001      Modalities   Modalities  Vasopneumatic      Vasopneumatic   Number Minutes Vasopneumatic   10 minutes    Vasopnuematic Location   Knee    Vasopneumatic Pressure  Medium    Vasopneumatic Temperature   34 deg             PT Education - 01/25/20 1203    Education Details  HEP    Person(s) Educated  Patient    Methods  Explanation;Demonstration;Handout    Comprehension  Verbalized understanding;Returned demonstration       PT Short Term Goals - 01/25/20 1212      PT SHORT TERM GOAL #1  Title  Ind with initial HEP for ROM and strength.    Time  2    Period  Weeks    Status  New    Target Date  02/08/20      PT SHORT TERM GOAL #2   Title  Patient able to safely amb 300 ft with SPC    Time  2    Period  Weeks    Status  New      PT SHORT TERM GOAL #3   Title  Patient able to climb stairs safely to her bedroom.    Time  4    Period  Weeks    Status  New    Target Date  02/22/20      PT SHORT TERM GOAL #4   Title  Patient to report  50% less discomfort in her lower leg and ankle due to reduced swelling.    Time  4    Period  Weeks    Status  New        PT Long Term Goals - 01/25/20 1215      PT LONG TERM GOAL #1   Title  Patient to demo improved knee ROM to 0-120 or greater to normalize gait and ADLs.    Time  8    Period  Weeks    Status  New    Target Date  03/21/20      PT LONG TERM GOAL #2   Title  Patient able to climb stairs with a reciprocal gait pattern.    Time  8    Period  Weeks    Status  New      PT LONG TERM GOAL #3   Title  Patient able to safely walk community distances without AD.    Time  8    Period  Weeks    Status  New      PT LONG TERM GOAL #4   Title  Patient to demo 5/5 right knee strength and 4+/5 or greater bil hip strength to normalize ADLS    Time  8    Period  Weeks    Status  New      PT LONG TERM GOAL #5   Title  Improved FOTO score to <= 44% limitations    Time  8    Period  Weeks    Status  New             Plan - 01/25/20 1204    Clinical Impression Statement  Patient presents s/p right TKA on 01/01/20. She amb with a RW using a step through gait and good hee/toe gait. Her main complaint is swelling and pain with end range movement. She has passive flex of 0-117 at this time. She is weak in her right hip and knee which is affecting bed mobiity and other ADLs including stairs. Her bedroom is upstairs and she would like to get back up there soon. She will benefit from PT to address these deficits and return her to her PLOF.    Personal Factors and Comorbidities  Age;Comorbidity 2    Comorbidities  HTN, COPD    Examination-Activity Limitations  Bed Mobility;Stairs;Locomotion Level    Stability/Clinical Decision Making  Stable/Uncomplicated    Clinical Decision Making  Low    Rehab Potential  Excellent    PT Frequency  2x / week    PT Duration  8 weeks    PT Treatment/Interventions  ADLs/Self Care Home Management;Cryotherapy;Dealer  Stimulation;Neuromuscular re-education;Balance training;Therapeutic exercise;Therapeutic activities;Functional mobility training;Gait training;Stair training;Patient/family education;Manual techniques;Taping;Vasopneumatic Device;Joint Manipulations    PT Next Visit Plan  Retrograde massage for edema; gait with cane; LE strength - focus on SLR, hip flexion strength to begin with for bed mobility; cont vaso    PT Home Exercise Plan  [id number]    Consulted and Agree with Plan of Care  Patient       Patient will benefit from skilled therapeutic intervention in order to improve the following deficits and impairments:  Abnormal gait, Decreased range of motion, Pain, Impaired flexibility, Decreased strength, Increased edema  Visit Diagnosis: Stiffness of right knee, not elsewhere classified - Plan: PT plan of care cert/re-cert  Localized edema - Plan: PT plan of care cert/re-cert  Other abnormalities of gait and mobility - Plan: PT plan of care cert/re-cert  Muscle weakness (generalized) - Plan: PT plan of care cert/re-cert  Acute pain of right knee - Plan: PT plan of care cert/re-cert     Problem List Patient Active Problem List   Diagnosis Date Noted  . Status post total right knee replacement 01/01/2020  . Primary osteoarthritis of right knee 11/14/2019  . Neck pain 12/06/2018  . Chronic pain of right knee 08/07/2016    Grant Fontana PT 01/25/2020, 12:29 PM  [facility] [address], [place], [place], [address] Phone: [phone]   Fax:  [phone]  Name: Leilah Kesner MRN: [id number] Date of Birth: 05-14-1935

## 2020-01-29 ENCOUNTER — Ambulatory Visit: Payer: Medicare Other

## 2020-01-29 ENCOUNTER — Other Ambulatory Visit: Payer: Self-pay

## 2020-01-29 DIAGNOSIS — M25561 Pain in right knee: Secondary | ICD-10-CM | POA: Diagnosis not present

## 2020-01-29 DIAGNOSIS — R600 Localized edema: Secondary | ICD-10-CM

## 2020-01-29 DIAGNOSIS — R2689 Other abnormalities of gait and mobility: Secondary | ICD-10-CM | POA: Diagnosis not present

## 2020-01-29 DIAGNOSIS — M6281 Muscle weakness (generalized): Secondary | ICD-10-CM

## 2020-01-29 DIAGNOSIS — M25661 Stiffness of right knee, not elsewhere classified: Secondary | ICD-10-CM

## 2020-01-29 NOTE — Therapy (Signed)
[facility] [address] [place], [place], [address] Phone: [phone]   Fax:  [phone]  Physical Therapy Treatment  Patient Details  Name: Kathryn [name] MRN: [id number] Date of Birth: September 23, 1934 Referring Provider (PT): Frankey Shown   Encounter Date: 01/29/2020  PT End of Session - 01/29/20 1141    Visit Number  2    Date for PT Re-Evaluation  03/21/20    Authorization Type  MCR/MCD    Progress Note Due on Visit  10    PT Start Time  1102    PT Stop Time  1156    PT Time Calculation (min)  54 min    Activity Tolerance  Patient tolerated treatment well    Behavior During Therapy  [facility] for tasks assessed/performed       Past Medical History:  Diagnosis Date  . Arthritis   . COPD (chronic obstructive pulmonary disease) ([place])   . Dyspnea   . Hypertension   . Hypothyroidism   . Osteopenia   . Pneumonia   . Pulmonary embolism ([place]) 04/2018  . Thyroid disease     Past Surgical History:  Procedure Laterality Date  . APPENDECTOMY    . EYE SURGERY    . TONSILLECTOMY AND ADENOIDECTOMY    . TOTAL KNEE ARTHROPLASTY Right 01/01/2020   Procedure: RIGHT TOTAL KNEE ARTHROPLASTY;  Surgeon: Leandrew Koyanagi, MD;  Location: [place];  Service: Orthopedics;  Laterality: Right;  . TUBAL LIGATION      There were no vitals filed for this visit.  Subjective Assessment - 01/29/20 1102    Subjective  I have been doing my exercises few and far between.    Currently in Pain?  Yes    Pain Score  4     Pain Location  Knee    Pain Orientation  Right    Pain Descriptors / Indicators  Aching    Pain Type  Surgical pain    Pain Onset  1 to 4 weeks ago    Pain Frequency  Constant    Aggravating Factors   standing, walking, working it    Pain Relieving Factors  ice, elevation                       [facility] Adult PT Treatment/Exercise - 01/29/20 0001      Exercises   Exercises  Knee/Hip;Ankle      Knee/Hip Exercises: Aerobic   Nustep  Level 1 x 8 minutes      Knee/Hip Exercises: Seated   Long Arc Quad  AROM;Right;2 sets;10 reps      Knee/Hip Exercises: Supine   Quad Sets  Right;AAROM;2 sets;10 reps    Short Arc Quad Sets  Strengthening;Right;2 sets;10 reps    Heel Slides  Strengthening;Right;2 sets;10 reps    Other Supine Knee/Hip Exercises  supine march x 5      Modalities   Modalities  Vasopneumatic      Vasopneumatic   Number Minutes Vasopneumatic   15 minutes    Vasopnuematic Location   Knee    Vasopneumatic Pressure  Medium    Vasopneumatic Temperature   34 deg      Manual Therapy   Manual Therapy  Edema management;Passive ROM;Soft tissue mobilization    Manual therapy comments  scar mobs, retrograde massage to reduce edema, elongation to distal quads and hamstrings               PT Short  Term Goals - 01/25/20 1212      PT SHORT TERM GOAL #1   Title  Ind with initial HEP for ROM and strength.    Time  2    Period  Weeks    Status  New    Target Date  02/08/20      PT SHORT TERM GOAL #2   Title  Patient able to safely amb 300 ft with SPC    Time  2    Period  Weeks    Status  New      PT SHORT TERM GOAL #3   Title  Patient able to climb stairs safely to her bedroom.    Time  4    Period  Weeks    Status  New    Target Date  02/22/20      PT SHORT TERM GOAL #4   Title  Patient to report 50% less discomfort in her lower leg and ankle due to reduced swelling.    Time  4    Period  Weeks    Status  New        PT Long Term Goals - 01/25/20 1215      PT LONG TERM GOAL #1   Title  Patient to demo improved knee ROM to 0-120 or greater to normalize gait and ADLs.    Time  8    Period  Weeks    Status  New    Target Date  03/21/20      PT LONG TERM GOAL #2   Title  Patient able to climb stairs with a reciprocal gait pattern.    Time  8    Period  Weeks    Status  New      PT LONG TERM GOAL #3   Title  Patient able to safely walk  community distances without AD.    Time  8    Period  Weeks    Status  New      PT LONG TERM GOAL #4   Title  Patient to demo 5/5 right knee strength and 4+/5 or greater bil hip strength to normalize ADLS    Time  8    Period  Weeks    Status  New      PT LONG TERM GOAL #5   Title  Improved FOTO score to <= 44% limitations    Time  8    Period  Weeks    Status  New            Plan - 01/29/20 1123    Clinical Impression Statement  Pt with first time follow-up after evaluation.  Pt has been performing HEP for strength, flexibility and edema management intermittently.  Pt with significant edema in the Rt LE into the foot.  PT performed retrograde massage and elongation to Rt distal hamstrings and quads.  Pt is not able to perform SLR on the Rt and this was modified to a supine march.  This was also challenging and pt was advised to practice marching in sitting and supine.  Pt will continue to benefit from skilled PT to address Rt knee A/ROM, flexibility, edema management, strength and gait to improve mobility and safety.    PT Frequency  2x / week    PT Duration  8 weeks    PT Treatment/Interventions  ADLs/Self Care Home Management;Cryotherapy;Electrical Stimulation;Neuromuscular re-education;Balance training;Therapeutic exercise;Therapeutic activities;Functional mobility training;Gait training;Stair training;Patient/family education;Manual techniques;Taping;Vasopneumatic Device;Joint Manipulations    PT  Next Visit Plan  Retrograde massage for edema; gait with cane; LE strength - focus on SLR, hip flexion strength to begin with for bed mobility; continue game ready    [place] and Agree with Plan of Care  Patient       Patient will benefit from skilled therapeutic intervention in order to improve the following deficits and impairments:  Abnormal gait, Decreased range of motion, Pain, Impaired flexibility, Decreased strength, Increased edema  Visit  Diagnosis: Stiffness of right knee, not elsewhere classified  Localized edema  Other abnormalities of gait and mobility  Muscle weakness (generalized)  Acute pain of right knee     Problem List Patient Active Problem List   Diagnosis Date Noted  . Status post total right knee replacement 01/01/2020  . Primary osteoarthritis of right knee 11/14/2019  . Neck pain 12/06/2018  . Chronic pain of right knee 08/07/2016    Sigurd Sos, PT 01/29/20 11:42 AM  [facility] [address] [place], [place], [address] Phone: [phone]   Fax:  [phone]  Name: Kathryn [name] MRN: [id number] Date of Birth: 10/20/1934

## 2020-02-01 ENCOUNTER — Telehealth: Payer: Self-pay | Admitting: *Deleted

## 2020-02-01 ENCOUNTER — Other Ambulatory Visit: Payer: Self-pay

## 2020-02-01 ENCOUNTER — Ambulatory Visit: Payer: Medicare Other | Admitting: Physical Therapy

## 2020-02-01 ENCOUNTER — Encounter: Payer: Self-pay | Admitting: Physical Therapy

## 2020-02-01 DIAGNOSIS — R2689 Other abnormalities of gait and mobility: Secondary | ICD-10-CM

## 2020-02-01 DIAGNOSIS — M6281 Muscle weakness (generalized): Secondary | ICD-10-CM

## 2020-02-01 DIAGNOSIS — R600 Localized edema: Secondary | ICD-10-CM

## 2020-02-01 DIAGNOSIS — M25561 Pain in right knee: Secondary | ICD-10-CM | POA: Diagnosis not present

## 2020-02-01 DIAGNOSIS — M25661 Stiffness of right knee, not elsewhere classified: Secondary | ICD-10-CM | POA: Diagnosis not present

## 2020-02-01 NOTE — Therapy (Signed)
[facility] [address], [place], [place], [address] Phone: [phone]   Fax:  [phone]  Physical Therapy Treatment  Patient Details  Name: Kathryn [name] MRN: [id number] Date of Birth: 01-01-1935 Referring Provider (PT): Frankey Shown   Encounter Date: 02/01/2020  PT End of Session - 02/01/20 1105    Visit Number  3    Date for PT Re-Evaluation  03/21/20    Authorization Type  MCR/MCD    PT Start Time  1105    PT Stop Time  1207   had to wait 5 min for vaso machine   PT Time Calculation (min)  62 min    Equipment Utilized During Treatment  Gait belt    Activity Tolerance  Patient tolerated treatment well    Behavior During Therapy  [facility] for tasks assessed/performed       Past Medical History:  Diagnosis Date  . Arthritis   . COPD (chronic obstructive pulmonary disease) ([place])   . Dyspnea   . Hypertension   . Hypothyroidism   . Osteopenia   . Pneumonia   . Pulmonary embolism ([name]) 04/2018  . Thyroid disease     Past Surgical History:  Procedure Laterality Date  . APPENDECTOMY    . EYE SURGERY    . TONSILLECTOMY AND ADENOIDECTOMY    . TOTAL KNEE ARTHROPLASTY Right 01/01/2020   Procedure: RIGHT TOTAL KNEE ARTHROPLASTY;  Surgeon: Leandrew Koyanagi, MD;  Location: [name];  Service: Orthopedics;  Laterality: Right;  . TUBAL LIGATION      There were no vitals filed for this visit.  Subjective Assessment - 02/01/20 1105    Subjective  My swelling is still my biggest problem. 7-8/10 this morning    Patient Stated Goals  to get back upstairs    Currently in Pain?  Yes    Pain Score  3     Pain Location  Knee    Pain Orientation  Right    Pain Descriptors / Indicators  Aching                        [facility] Adult PT Treatment/Exercise - 02/01/20 0001      Ambulation/Gait   Ambulation/Gait  Yes    Ambulation/Gait Assistance  6: Modified independent (Device/Increase time)    Assistive  device  Straight cane;Large base quad cane;None    Gait Pattern  Decreased step length - left    Ambulation Surface  Level    Gait Comments  worked with both canes using 3 point stance then 2 pt stance then no AD CGA with gait belt x 20 min; some LOB with increased gait velocity       Knee/Hip Exercises: Seated   Long Arc Quad  Strengthening;2 sets;10 reps    [employer] Limitations  2nd set with 5 sec hold    Heel Slides  AAROM;Right;20 reps    Heel Slides Limitations  on slider with overpressure from left foot    Knee/Hip Flexion  2x10 in chair to avoid leaning back      Knee/Hip Exercises: Supine   Other Supine Knee/Hip Exercises  ankle pumps and circles while leg elevated to help with edema      Vasopneumatic   Number Minutes Vasopneumatic   15 minutes    Vasopnuematic Location   Knee    Vasopneumatic Pressure  Medium    Vasopneumatic Temperature   34 deg  Manual Therapy   Manual Therapy  Edema management;Passive ROM;Soft tissue mobilization    Edema Management  retrograde massage with leg elevated               PT Short Term Goals - 01/25/20 1212      PT SHORT TERM GOAL #1   Title  Ind with initial HEP for ROM and strength.    Time  2    Period  Weeks    Status  New    Target Date  02/08/20      PT SHORT TERM GOAL #2   Title  Patient able to safely amb 300 ft with SPC    Time  2    Period  Weeks    Status  New      PT SHORT TERM GOAL #3   Title  Patient able to climb stairs safely to her bedroom.    Time  4    Period  Weeks    Status  New    Target Date  02/22/20      PT SHORT TERM GOAL #4   Title  Patient to report 50% less discomfort in her lower leg and ankle due to reduced swelling.    Time  4    Period  Weeks    Status  New        PT Long Term Goals - 01/25/20 1215      PT LONG TERM GOAL #1   Title  Patient to demo improved knee ROM to 0-120 or greater to normalize gait and ADLs.    Time  8    Period  Weeks    Status  New     Target Date  03/21/20      PT LONG TERM GOAL #2   Title  Patient able to climb stairs with a reciprocal gait pattern.    Time  8    Period  Weeks    Status  New      PT LONG TERM GOAL #3   Title  Patient able to safely walk community distances without AD.    Time  8    Period  Weeks    Status  New      PT LONG TERM GOAL #4   Title  Patient to demo 5/5 right knee strength and 4+/5 or greater bil hip strength to normalize ADLS    Time  8    Period  Weeks    Status  New      PT LONG TERM GOAL #5   Title  Improved FOTO score to <= 44% limitations    Time  8    Period  Weeks    Status  New            Plan - 02/01/20 1153    Clinical Impression Statement  Patient presents today with [facility] using in RUE. She still has significant edema in the Rt LE and reports the leg feels heavy. Advised her bring ACE bandage and we could show her how to wrap her leg as compression hose are not tolerated. She did well with seated strengthening. We focused mainly on gait today. AD transferred to Lt UE with explanation why. Patient walked better with CGA and no AD but had LOB with increased gait speed. After amb with no AD, she did better with her [facility] using just one point of cane. Patient advised to continue using AD in community. She use furniture to assist at  home.    Comorbidities  HTN, COPD    Examination-Activity Limitations  Bed Mobility;Stairs;Locomotion Level    PT Treatment/Interventions  ADLs/Self Care Home Management;Cryotherapy;Electrical Stimulation;Neuromuscular re-education;Balance training;Therapeutic exercise;Therapeutic activities;Functional mobility training;Gait training;Stair training;Patient/family education;Manual techniques;Taping;Vasopneumatic Device;Joint Manipulations    PT Next Visit Plan  Retrograde massage for edema; teach ACE bandage wrap for edema; gait with cane; LE strength - focus on SLR, hip flexion strength to begin with for bed mobility; continue game ready    [place]       Patient will benefit from skilled therapeutic intervention in order to improve the following deficits and impairments:  Abnormal gait, Decreased range of motion, Pain, Impaired flexibility, Decreased strength, Increased edema  Visit Diagnosis: Stiffness of right knee, not elsewhere classified  Localized edema  Other abnormalities of gait and mobility  Muscle weakness (generalized)  Acute pain of right knee     Problem List Patient Active Problem List   Diagnosis Date Noted  . Status post total right knee replacement 01/01/2020  . Primary osteoarthritis of right knee 11/14/2019  . Neck pain 12/06/2018  . Chronic pain of right knee 08/07/2016    Madelyn Flavors PT 02/01/2020, 4:20 PM  [facility] [address] [place], [place], [address] Phone: [phone]   Fax:  [phone]  Name: Kathryn [name] MRN: [id number] Date of Birth: [birth year]

## 2020-02-01 NOTE — Telephone Encounter (Signed)
Ortho bundle 30 day call completed. °

## 2020-02-05 ENCOUNTER — Ambulatory Visit: Payer: Medicare Other

## 2020-02-05 ENCOUNTER — Other Ambulatory Visit: Payer: Self-pay

## 2020-02-05 DIAGNOSIS — M6281 Muscle weakness (generalized): Secondary | ICD-10-CM

## 2020-02-05 DIAGNOSIS — R2689 Other abnormalities of gait and mobility: Secondary | ICD-10-CM | POA: Diagnosis not present

## 2020-02-05 DIAGNOSIS — R600 Localized edema: Secondary | ICD-10-CM

## 2020-02-05 DIAGNOSIS — M25661 Stiffness of right knee, not elsewhere classified: Secondary | ICD-10-CM

## 2020-02-05 DIAGNOSIS — M25561 Pain in right knee: Secondary | ICD-10-CM | POA: Diagnosis not present

## 2020-02-05 NOTE — Therapy (Signed)
[facility] [address], [place], [place], [address] Phone: [phone]   Fax:  [phone]  Physical Therapy Treatment  Patient Details  Name: Kathryn [name] MRN: [id number] Date of Birth: Sep 25, 1934 Referring Provider (PT): Frankey Shown   Encounter Date: 02/05/2020  PT End of Session - 02/05/20 1146    Visit Number  4    Date for PT Re-Evaluation  03/21/20    Authorization Type  MCR/MCD    Progress Note Due on Visit  10    PT Start Time  1100    PT Stop Time  1158    PT Time Calculation (min)  58 min    Activity Tolerance  Patient tolerated treatment well    Behavior During Therapy  [facility] for tasks assessed/performed       Past Medical History:  Diagnosis Date  . Arthritis   . COPD (chronic obstructive pulmonary disease) ([place])   . Dyspnea   . Hypertension   . Hypothyroidism   . Osteopenia   . Pneumonia   . Pulmonary embolism ([place]) 04/2018  . Thyroid disease     Past Surgical History:  Procedure Laterality Date  . APPENDECTOMY    . EYE SURGERY    . TONSILLECTOMY AND ADENOIDECTOMY    . TOTAL KNEE ARTHROPLASTY Right 01/01/2020   Procedure: RIGHT TOTAL KNEE ARTHROPLASTY;  Surgeon: Leandrew Koyanagi, MD;  Location: [place];  Service: Orthopedics;  Laterality: Right;  . TUBAL LIGATION      There were no vitals filed for this visit.  Subjective Assessment - 02/05/20 1112    Subjective  My leg is just so swollen.  Pain isn't too bad.    Pain Score  3     Pain Location  Knee    Pain Orientation  Right    Pain Descriptors / Indicators  Aching    Pain Type  Surgical pain    Pain Onset  1 to 4 weeks ago    Pain Frequency  Constant    Aggravating Factors   standing, walking    Pain Relieving Factors  ice, elevation                        [facility] Adult PT Treatment/Exercise - 02/05/20 0001      Knee/Hip Exercises: Standing   Rocker Board  2 minutes    Rebounder  weight shifting 3 ways  x1 minute each      Knee/Hip Exercises: Seated   Long Arc Quad  Strengthening;2 sets;10 reps    Long [employer] Limitations  2nd set with 5 sec hold    Hamstring Curl  Strengthening;Right;2 sets;20 reps    Hamstring Limitations  yellow theraband      Knee/Hip Exercises: Supine   Short Arc Quad Sets  Strengthening;Right;2 sets;10 reps    Short Arc Quad Sets Limitations  hold 5 seconds    Other Supine Knee/Hip Exercises  pulling knee to the chest actively to activae hip flexor 2x5- difficult for pt      Vasopneumatic   Number Minutes Vasopneumatic   15 minutes    Vasopnuematic Location   Knee    Vasopneumatic Pressure  Medium    Vasopneumatic Temperature   34 deg      Manual Therapy   Manual Therapy  Edema management;Passive ROM;Soft tissue mobilization    Edema Management  retrograde massage with leg elevated  PT Short Term Goals - 01/25/20 1212      PT SHORT TERM GOAL #1   Title  Ind with initial HEP for ROM and strength.    Time  2    Period  Weeks    Status  New    Target Date  02/08/20      PT SHORT TERM GOAL #2   Title  Patient able to safely amb 300 ft with SPC    Time  2    Period  Weeks    Status  New      PT SHORT TERM GOAL #3   Title  Patient able to climb stairs safely to her bedroom.    Time  4    Period  Weeks    Status  New    Target Date  02/22/20      PT SHORT TERM GOAL #4   Title  Patient to report 50% less discomfort in her lower leg and ankle due to reduced swelling.    Time  4    Period  Weeks    Status  New        PT Long Term Goals - 01/25/20 1215      PT LONG TERM GOAL #1   Title  Patient to demo improved knee ROM to 0-120 or greater to normalize gait and ADLs.    Time  8    Period  Weeks    Status  New    Target Date  03/21/20      PT LONG TERM GOAL #2   Title  Patient able to climb stairs with a reciprocal gait pattern.    Time  8    Period  Weeks    Status  New      PT LONG TERM GOAL #3   Title   Patient able to safely walk community distances without AD.    Time  8    Period  Weeks    Status  New      PT LONG TERM GOAL #4   Title  Patient to demo 5/5 right knee strength and 4+/5 or greater bil hip strength to normalize ADLS    Time  8    Period  Weeks    Status  New      PT LONG TERM GOAL #5   Title  Improved FOTO score to <= 44% limitations    Time  8    Period  Weeks    Status  New            Plan - 02/05/20 1129    Clinical Impression Statement  Pt with continued main complaint of edema in the Rt lower leg.  Pt was able to ascend and descend steps to her bedroom this week and sleep in her bed.  Pt tolerated advancement of standing exercises well today. Pt is walking with a quad cane for community distances and no device at all with assistance of furniture at home.  PT emphasized importance of symmetry with gait.  Edema is pitting at the Rt foot/ankle/calf and improved with retrograde massage.  Pt reports that she will get a pneumatic device at home to assist with edema soon.  Pt will continue to benefit from skilled PT to address Rt knee flexibility, strength, gait and edema management.    PT Frequency  2x / week    PT Duration  8 weeks    PT Treatment/Interventions  ADLs/Self Care Home Management;Cryotherapy;Electrical Stimulation;Neuromuscular re-education;Balance  training;Therapeutic exercise;Therapeutic activities;Functional mobility training;Gait training;Stair training;Patient/family education;Manual techniques;Taping;Vasopneumatic Device;Joint Manipulations    PT Next Visit Plan  Retrograde massage for edema; gait with cane; LE strength - focus on SLR, hip flexion strength to begin with for bed mobility; continue game ready    [place]  initial certification is signed    Consulted and Agree with Plan of Care  Patient       Patient will benefit from skilled therapeutic intervention in order to improve the  following deficits and impairments:  Abnormal gait, Decreased range of motion, Pain, Impaired flexibility, Decreased strength, Increased edema  Visit Diagnosis: Stiffness of right knee, not elsewhere classified  Localized edema  Other abnormalities of gait and mobility  Muscle weakness (generalized)  Acute pain of right knee     Problem List Patient Active Problem List   Diagnosis Date Noted  . Status post total right knee replacement 01/01/2020  . Primary osteoarthritis of right knee 11/14/2019  . Neck pain 12/06/2018  . Chronic pain of right knee 08/07/2016     Sigurd Sos, PT 02/05/20 11:47 AM  [facility] [address], [place], [place], [address] Phone: [phone]   Fax:  [phone]  Name: Kathryn [name] MRN: [id number] Date of Birth: [birth year]

## 2020-02-08 ENCOUNTER — Other Ambulatory Visit: Payer: Self-pay

## 2020-02-08 ENCOUNTER — Ambulatory Visit: Payer: Medicare Other

## 2020-02-08 DIAGNOSIS — M6281 Muscle weakness (generalized): Secondary | ICD-10-CM

## 2020-02-08 DIAGNOSIS — M25561 Pain in right knee: Secondary | ICD-10-CM

## 2020-02-08 DIAGNOSIS — M25661 Stiffness of right knee, not elsewhere classified: Secondary | ICD-10-CM | POA: Diagnosis not present

## 2020-02-08 DIAGNOSIS — R2689 Other abnormalities of gait and mobility: Secondary | ICD-10-CM

## 2020-02-08 DIAGNOSIS — R600 Localized edema: Secondary | ICD-10-CM | POA: Diagnosis not present

## 2020-02-08 NOTE — Therapy (Signed)
[facility] [address] [place], [place], [address] Phone: [phone]   Fax:  [phone]  Physical Therapy Treatment  Patient Details  Name: Kathryn [name] MRN: [id number] Date of Birth: [birth year] Referring Provider (PT): Frankey Shown   Encounter Date: 02/08/2020  PT End of Session - 02/08/20 0848    Visit Number  5    Date for PT Re-Evaluation  03/21/20    Authorization Type  MCR/MCD    Progress Note Due on Visit  10    PT Start Time  0800    PT Stop Time  0848    PT Time Calculation (min)  48 min    Activity Tolerance  Patient tolerated treatment well    Behavior During Therapy  [facility] for tasks assessed/performed       Past Medical History:  Diagnosis Date  . Arthritis   . COPD (chronic obstructive pulmonary disease) ([place])   . Dyspnea   . Hypertension   . Hypothyroidism   . Osteopenia   . Pneumonia   . Pulmonary embolism ([place]) 04/2018  . Thyroid disease     Past Surgical History:  Procedure Laterality Date  . APPENDECTOMY    . EYE SURGERY    . TONSILLECTOMY AND ADENOIDECTOMY    . TOTAL KNEE ARTHROPLASTY Right 01/01/2020   Procedure: RIGHT TOTAL KNEE ARTHROPLASTY;  Surgeon: Leandrew Koyanagi, MD;  Location: [place];  Service: Orthopedics;  Laterality: Right;  . TUBAL LIGATION      There were no vitals filed for this visit.  Subjective Assessment - 02/08/20 0808    Subjective  It is really early today.    Pertinent History  HTN, COPD, neck pain    Currently in Pain?  Yes    Pain Score  5     Pain Location  Knee    Pain Orientation  Right    Pain Descriptors / Indicators  Aching    Pain Type  Surgical pain    Pain Onset  1 to 4 weeks ago    Pain Frequency  Constant         [facility] PT Assessment - 02/08/20 0001      PROM   Right Knee Extension  0    Right Knee Flexion  114                    [facility] Adult PT Treatment/Exercise - 02/08/20 0001      Ambulation/Gait   Ambulation/Gait  Yes    Assistive device  Straight cane    Gait Comments  gait training with standard cane and sequencing      Knee/Hip Exercises: Stretches   Active Hamstring Stretch  Right;3 reps;20 seconds      Knee/Hip Exercises: Aerobic   Nustep  Level 1 x 10 minutes      Knee/Hip Exercises: Standing   Rocker Board  3 minutes    Rebounder  weight shifting 3 ways x1 minute each      Knee/Hip Exercises: Seated   Sit to Sand  2 sets;5 reps;with UE support   on black pad     Manual Therapy   Manual Therapy  Edema management;Passive ROM;Soft tissue mobilization    Edema Management  retrograde massage with leg elevated               PT Short Term Goals - 02/08/20 0809      PT SHORT TERM GOAL #1   Title  Ind with initial HEP for ROM and strength.    Status  Achieved      PT SHORT TERM GOAL #2   Title  Patient able to safely amb 300 ft with [facility]    Status  Achieved      PT SHORT TERM GOAL #3   Title  Patient able to climb stairs safely to her bedroom.    Baseline  Pt has done this the past 4 nights    Status  Achieved        PT Long Term Goals - 01/25/20 1215      PT LONG TERM GOAL #1   Title  Patient to demo improved knee ROM to 0-120 or greater to normalize gait and ADLs.    Time  8    Period  Weeks    Status  New    Target Date  03/21/20      PT LONG TERM GOAL #2   Title  Patient able to climb stairs with a reciprocal gait pattern.    Time  8    Period  Weeks    Status  New      PT LONG TERM GOAL #3   Title  Patient able to safely walk community distances without AD.    Time  8    Period  Weeks    Status  New      PT LONG TERM GOAL #4   Title  Patient to demo 5/5 right knee strength and 4+/5 or greater bil hip strength to normalize ADLS    Time  8    Period  Weeks    Status  New      PT LONG TERM GOAL #5   Title  Improved FOTO score to <= 44% limitations    Time  8    Period  Weeks    Status  New            Plan - 02/08/20 [id number]     Clinical Impression Statement  Pt has been able to ambulate up her steps to sleep in her upstairs bedroom x 4 nights.  Pt with continued main complaint of edema in the Rt lower leg.   Pt tolerated advancement of standing exercises well this week. Pt is walking with a quad cane for community distances and no device at all with assistance of furniture at home. PT provided gait training with a standard cane and pt had a difficult time with the sequencing.  Edema is pitting at the Rt foot/ankle/calf and improved with retrograde massage.  Pt reports that she will get a pneumatic device at home to assist with edema soon.   Pt declined Game Ready today due to being very cold after this last session.  Pt will continue to benefit from skilled PT to address Rt knee flexibility, strength, gait and edema management.    Rehab Potential  Excellent    PT Frequency  2x / week    PT Duration  8 weeks    PT Treatment/Interventions  ADLs/Self Care Home Management;Cryotherapy;Electrical Stimulation;Neuromuscular re-education;Balance training;Therapeutic exercise;Therapeutic activities;Functional mobility training;Gait training;Stair training;Patient/family education;Manual techniques;Taping;Vasopneumatic Device;Joint Manipulations    PT Next Visit Plan  Retrograde massage for edema; gait with cane; LE strength - focus on SLR, step-ups,  hip flexion strength to begin with for bed mobility; hold Game Ready-pt gets very cold    PT Home Exercise Plan  [id number]    Consulted and Agree with Plan of Care  Patient  Patient will benefit from skilled therapeutic intervention in order to improve the following deficits and impairments:  Abnormal gait, Decreased range of motion, Pain, Impaired flexibility, Decreased strength, Increased edema  Visit Diagnosis: Stiffness of right knee, not elsewhere classified  Localized edema  Other abnormalities of gait and mobility  Muscle weakness (generalized)  Acute pain of right  knee     Problem List Patient Active Problem List   Diagnosis Date Noted  . Status post total right knee replacement 01/01/2020  . Primary osteoarthritis of right knee 11/14/2019  . Neck pain 12/06/2018  . Chronic pain of right knee 08/07/2016     Sigurd Sos, PT 02/08/20 8:51 AM  [facility] [address] [place], [place], [address] Phone: [phone]   Fax:  [phone]  Name: Kathryn [name] MRN: [id number] Date of Birth: [birth year]

## 2020-02-12 ENCOUNTER — Ambulatory Visit: Payer: Medicare Other | Admitting: Physical Therapy

## 2020-02-12 ENCOUNTER — Other Ambulatory Visit: Payer: Self-pay

## 2020-02-12 ENCOUNTER — Encounter: Payer: Self-pay | Admitting: Physical Therapy

## 2020-02-12 DIAGNOSIS — M25561 Pain in right knee: Secondary | ICD-10-CM | POA: Diagnosis not present

## 2020-02-12 DIAGNOSIS — M25661 Stiffness of right knee, not elsewhere classified: Secondary | ICD-10-CM

## 2020-02-12 DIAGNOSIS — R2689 Other abnormalities of gait and mobility: Secondary | ICD-10-CM

## 2020-02-12 DIAGNOSIS — M6281 Muscle weakness (generalized): Secondary | ICD-10-CM

## 2020-02-12 DIAGNOSIS — R600 Localized edema: Secondary | ICD-10-CM | POA: Diagnosis not present

## 2020-02-12 NOTE — Therapy (Signed)
[facility] [address], [place], [place], [address] Phone: [phone]   Fax:  [phone]  Physical Therapy Treatment  Patient Details  Name: Kathryn [name] MRN: [id number] Date of Birth: 02/07/1935 Referring Provider (PT): Frankey Shown   Encounter Date: 02/12/2020  PT End of Session - 02/12/20 1608    Visit Number  6    Date for PT Re-Evaluation  03/21/20    Authorization Type  MCR/MCD    Progress Note Due on Visit  10    PT Start Time  [id number]    PT Stop Time  1625    PT Time Calculation (min)  55 min    Activity Tolerance  Patient tolerated treatment well;No increased pain    Behavior During Therapy  [facility] for tasks assessed/performed       Past Medical History:  Diagnosis Date  . Arthritis   . COPD (chronic obstructive pulmonary disease) ([place])   . Dyspnea   . Hypertension   . Hypothyroidism   . Osteopenia   . Pneumonia   . Pulmonary embolism ([place]) 04/2018  . Thyroid disease     Past Surgical History:  Procedure Laterality Date  . APPENDECTOMY    . EYE SURGERY    . TONSILLECTOMY AND ADENOIDECTOMY    . TOTAL KNEE ARTHROPLASTY Right 01/01/2020   Procedure: RIGHT TOTAL KNEE ARTHROPLASTY;  Surgeon: Leandrew Koyanagi, MD;  Location: [place];  Service: Orthopedics;  Laterality: Right;  . TUBAL LIGATION      There were no vitals filed for this visit.  Subjective Assessment - 02/12/20 1535    Subjective  I am hanging in there. I do not do much. I have started some housework. I see the doctor tomorrow.    Pertinent History  HTN, COPD, neck pain    Patient Stated Goals  to get back upstairs    Currently in Pain?  Yes    Pain Score  4     Pain Location  Knee    Pain Orientation  Right    Pain Descriptors / Indicators  Aching    Pain Type  Surgical pain    Pain Onset  1 to 4 weeks ago    Pain Frequency  Constant    Aggravating Factors   standing, walking    Pain Relieving Factors  ice, elevation    Multiple Pain Sites  No         [facility] PT Assessment - 02/12/20 0001      Assessment   Medical Diagnosis  primary OA Rt knee; s/p Rt TKA    Referring Provider (PT)  Naiping Xu    Onset Date/Surgical Date  01/01/20      Precautions   Precautions  None      Restrictions   Weight Bearing Restrictions  No      PROM   Right Knee Extension  0    Right Knee Flexion  120                    [facility] Adult PT Treatment/Exercise - 02/12/20 0001      Knee/Hip Exercises: Aerobic   Nustep  Level 1 x 10 minutes while assessing patient      Knee/Hip Exercises: Standing   Rocker Board  3 minutes    Rebounder  weight shifting 3 ways x1 minute each      Knee/Hip Exercises: Seated   Sit to Sand  2 sets;5 reps;with UE support  on black pad; red band around knees     Knee/Hip Exercises: Supine   Short Arc Quad Sets  Strengthening;Right;2 sets;10 reps    Short Arc Quad Sets Limitations  3#      Modalities   Modalities  Vasopneumatic      Vasopneumatic   Number Minutes Vasopneumatic   15 minutes   placed sheet on patient due to being cold   Vasopnuematic Location   Knee    Vasopneumatic Pressure  Medium    Vasopneumatic Temperature   34 deg      Manual Therapy   Manual Therapy  Edema management;Passive ROM;Soft tissue mobilization    Edema Management  retrograde massage with leg elevated               PT Short Term Goals - 02/08/20 0809      PT SHORT TERM GOAL #1   Title  Ind with initial HEP for ROM and strength.    Status  Achieved      PT SHORT TERM GOAL #2   Title  Patient able to safely amb 300 ft with [facility]    Status  Achieved      PT SHORT TERM GOAL #3   Title  Patient able to climb stairs safely to her bedroom.    Baseline  Pt has done this the past 4 nights    Status  Achieved        PT Long Term Goals - 02/12/20 1612      PT LONG TERM GOAL #1   Title  Patient to demo improved knee ROM to 0-120 or greater to normalize gait and ADLs.     Time  8    Period  Weeks    Status  Achieved            Plan - 02/12/20 1609    Clinical Impression Statement  Patient has increased right knee flexion P/ROM to 120 degrees. Patient scar is moving very well. Patient continues to have swelling in her right foot. Patient is starting to do some housework at home. Patient came in today without a cane and walked well. Patient will benefit from skilled therapy to address right knee flexiility, strength, gait, and edema management.    Personal Factors and Comorbidities  Age;Comorbidity 2    Comorbidities  HTN, COPD    Examination-Activity Limitations  Bed Mobility;Stairs;Locomotion Level    Stability/Clinical Decision Making  Stable/Uncomplicated    Rehab Potential  Excellent    PT Frequency  2x / week    PT Duration  8 weeks    PT Treatment/Interventions  ADLs/Self Care Home Management;Cryotherapy;Electrical Stimulation;Neuromuscular re-education;Balance training;Therapeutic exercise;Therapeutic activities;Functional mobility training;Gait training;Stair training;Patient/family education;Manual techniques;Taping;Vasopneumatic Device;Joint Manipulations    PT Next Visit Plan  Retrograde massage for edema; gait with cane; LE strength - focus on SLR, step-ups,  hip flexion strength to begin with for bed mobility; see if using game ready with a sheet on her helped    PT [place] and Agree with Plan of Care  Patient       Patient will benefit from skilled therapeutic intervention in order to improve the following deficits and impairments:  Abnormal gait, Decreased range of motion, Pain, Impaired flexibility, Decreased strength, Increased edema  Visit Diagnosis: Stiffness of right knee, not elsewhere classified  Localized edema  Other abnormalities of gait and mobility  Muscle weakness (generalized)  Acute pain of right knee  Problem List Patient Active Problem List   Diagnosis Date Noted  .  Status post total right knee replacement 01/01/2020  . Primary osteoarthritis of right knee 11/14/2019  . Neck pain 12/06/2018  . Chronic pain of right knee 08/07/2016    Earlie Counts, PT 02/12/20 4:14 PM   [facility] [address] [place], [place], [address] Phone: [phone]   Fax:  [phone]  Name: Ellaine Martinec MRN: [id number] Date of Birth: [birth year]

## 2020-02-13 ENCOUNTER — Other Ambulatory Visit: Payer: Self-pay

## 2020-02-13 ENCOUNTER — Ambulatory Visit (HOSPITAL_COMMUNITY): Payer: Medicare Other

## 2020-02-13 ENCOUNTER — Encounter: Payer: Self-pay | Admitting: Orthopaedic Surgery

## 2020-02-13 ENCOUNTER — Ambulatory Visit (INDEPENDENT_AMBULATORY_CARE_PROVIDER_SITE_OTHER): Payer: Medicare Other | Admitting: Orthopaedic Surgery

## 2020-02-13 ENCOUNTER — Ambulatory Visit (INDEPENDENT_AMBULATORY_CARE_PROVIDER_SITE_OTHER): Payer: Medicare Other

## 2020-02-13 DIAGNOSIS — Z96651 Presence of right artificial knee joint: Secondary | ICD-10-CM

## 2020-02-13 NOTE — Progress Notes (Signed)
   Post-Op Visit Note   Patient: Kathryn [name]           Date of Birth: 1935/07/20           MRN: [id number] Visit Date: 02/13/2020 PCP: Mayra Neer, MD   Assessment & Plan:  Chief Complaint:  Chief Complaint  Patient presents with  . Right Knee - Pain   Visit Diagnoses:  1. Status post total right knee replacement     Plan: [name] 6 weeks status post right total knee replacement.  Overall she is doing well.  The only issue she has been having is severe lymphedema.  Her range of motion is actually progressing well despite this.  Surgical scar is fully healed.  She does have 2+ pitting edema.  Range of motion is actually quite good to about 115 degrees.  X-rays are unremarkable.  Ace bandage was applied today.  We are continuing to work on Biochemist, clinical for lymphedema machine.  Recheck in 6 weeks.  Questions encouraged and answered.  Follow-Up Instructions: Return in about 6 weeks (around 03/26/2020).   Orders:  Orders Placed This Encounter  Procedures  . XR KNEE 3 VIEW RIGHT   No orders of the defined types were placed in this encounter.   Imaging: XR KNEE 3 VIEW RIGHT  Result Date: 02/13/2020 Stable total knee replacement without complication.   PMFS History: Patient Active Problem List   Diagnosis Date Noted  . Status post total right knee replacement 01/01/2020  . Primary osteoarthritis of right knee 11/14/2019  . Neck pain 12/06/2018  . Chronic pain of right knee 08/07/2016   Past Medical History:  Diagnosis Date  . Arthritis   . COPD (chronic obstructive pulmonary disease) ([place])   . Dyspnea   . Hypertension   . Hypothyroidism   . Osteopenia   . Pneumonia   . Pulmonary embolism ([place]) 04/2018  . Thyroid disease     Family History  Problem Relation Age of Onset  . Breast cancer Mother   . Breast cancer Daughter 32    Past Surgical History:  Procedure Laterality Date  . APPENDECTOMY    . EYE SURGERY    . TONSILLECTOMY AND  ADENOIDECTOMY    . TOTAL KNEE ARTHROPLASTY Right 01/01/2020   Procedure: RIGHT TOTAL KNEE ARTHROPLASTY;  Surgeon: Leandrew Koyanagi, MD;  Location: [place];  Service: Orthopedics;  Laterality: Right;  . TUBAL LIGATION     Social History   Occupational History  . Not on file  Tobacco Use  . Smoking status: Former Smoker    Quit date: 2018    Years since quitting: 3.3  . Smokeless tobacco: Never Used  Substance and Sexual Activity  . Alcohol use: Never  . Drug use: Never  . Sexual activity: Not on file

## 2020-02-14 ENCOUNTER — Ambulatory Visit (HOSPITAL_COMMUNITY)
Admission: RE | Admit: 2020-02-14 | Discharge: 2020-02-14 | Disposition: A | Discharge status: Home / Self Care | Payer: Medicare Other | Source: Ambulatory Visit | Attending: Orthopaedic Surgery | Admitting: Orthopaedic Surgery

## 2020-02-14 DIAGNOSIS — Z96651 Presence of right artificial knee joint: Secondary | ICD-10-CM | POA: Insufficient documentation

## 2020-02-15 ENCOUNTER — Other Ambulatory Visit: Payer: Self-pay

## 2020-02-15 ENCOUNTER — Ambulatory Visit: Payer: Medicare Other | Admitting: Physical Therapy

## 2020-02-15 ENCOUNTER — Encounter: Payer: Self-pay | Admitting: Physical Therapy

## 2020-02-15 DIAGNOSIS — M25561 Pain in right knee: Secondary | ICD-10-CM

## 2020-02-15 DIAGNOSIS — R600 Localized edema: Secondary | ICD-10-CM

## 2020-02-15 DIAGNOSIS — R2689 Other abnormalities of gait and mobility: Secondary | ICD-10-CM | POA: Diagnosis not present

## 2020-02-15 DIAGNOSIS — M6281 Muscle weakness (generalized): Secondary | ICD-10-CM

## 2020-02-15 DIAGNOSIS — M25661 Stiffness of right knee, not elsewhere classified: Secondary | ICD-10-CM

## 2020-02-15 NOTE — Therapy (Signed)
[facility] [address], [place], [place], [address] Phone: [phone]   Fax:  [phone]  Physical Therapy Treatment  Patient Details  Name: Kathryn [name] MRN: [id number] Date of Birth: [birth year] Referring Provider (PT): Frankey Shown   Encounter Date: 02/15/2020  PT End of Session - 02/15/20 0957    Visit Number  7    Date for PT Re-Evaluation  03/21/20    Authorization Type  MCR/MCD    Progress Note Due on Visit  10    PT Start Time  0930    PT Stop Time  1030    PT Time Calculation (min)  60 min    Activity Tolerance  Patient tolerated treatment well;No increased pain    Behavior During Therapy  WFL for tasks assessed/performed       Past Medical History:  Diagnosis Date  . Arthritis   . COPD (chronic obstructive pulmonary disease) ([place])   . Dyspnea   . Hypertension   . Hypothyroidism   . Osteopenia   . Pneumonia   . Pulmonary embolism ([place]) 04/2018  . Thyroid disease     Past Surgical History:  Procedure Laterality Date  . APPENDECTOMY    . EYE SURGERY    . TONSILLECTOMY AND ADENOIDECTOMY    . TOTAL KNEE ARTHROPLASTY Right 01/01/2020   Procedure: RIGHT TOTAL KNEE ARTHROPLASTY;  Surgeon: Leandrew Koyanagi, MD;  Location: [place];  Service: Orthopedics;  Laterality: Right;  . TUBAL LIGATION      There were no vitals filed for this visit.  Subjective Assessment - 02/15/20 0935    Subjective  I have not slept last night so I am tired. I saw the doctor and happy with my progress. The x-rays look good. I had an ultrasound of the right lower leg and I do not have a blood clot. I have not heard about the compression machine yet. The doctor gave me an ace bandage to wrap my leg for the swelling.    Pertinent History  HTN, COPD, neck pain    Patient Stated Goals  to get back upstairs    Currently in Pain?  Yes    Pain Score  4     Pain Location  Knee   patella   Pain Orientation  Right    Pain  Descriptors / Indicators  Aching    Pain Type  Surgical pain    Pain Onset  1 to 4 weeks ago    Pain Frequency  Constant    Aggravating Factors   standing, walking    Pain Relieving Factors  ice elevation    Multiple Pain Sites  No                        [facility] Adult PT Treatment/Exercise - 02/15/20 0001      Knee/Hip Exercises: Aerobic   Nustep  Level 1 x 10 minutes while assessing patient      Knee/Hip Exercises: Standing   Forward Step Up  Right;1 set;15 reps    Forward Step Up Limitations  then go up and down steps with alternating patterns 2 times    Rebounder  weight shifting 3 ways x1 minute each      Knee/Hip Exercises: Supine   Short Arc Quad Sets  Strengthening;Right;2 sets;10 reps    Short Arc Quad Sets Limitations  3#    Heel Slides  Strengthening;Right;1 set;10 reps    Heel Slides  Limitations  with 4# wt on ankle and slider on foot    Straight Leg Raises  Right;Strengthening;10 reps;2 sets      Modalities   Modalities  Vasopneumatic      Vasopneumatic   Number Minutes Vasopneumatic   15 minutes   placed sheet on patient due to being cold   Vasopnuematic Location   Knee    Vasopneumatic Pressure  Medium    Vasopneumatic Temperature   34 deg      Manual Therapy   Manual Therapy  Other (comment)    Other Manual Therapy  placed Ace wrap on right lower leg to decrease the swelling               PT Short Term Goals - 02/08/20 0809      PT SHORT TERM GOAL #1   Title  Ind with initial HEP for ROM and strength.    Status  Achieved      PT SHORT TERM GOAL #2   Title  Patient able to safely amb 300 ft with [facility]    Status  Achieved      PT SHORT TERM GOAL #3   Title  Patient able to climb stairs safely to her bedroom.    Baseline  Pt has done this the past 4 nights    Status  Achieved        PT Long Term Goals - 02/12/20 1612      PT LONG TERM GOAL #1   Title  Patient to demo improved knee ROM to 0-120 or greater to normalize gait  and ADLs.    Time  8    Period  Weeks    Status  Achieved            Plan - 02/15/20 0957    Clinical Impression Statement  Patient is very tired today due to not getting much sleep. She has to wake up 8 times per night to go to the bathroom. Patient needed frequents rests today. She is able to go up and down the stairs with alternate gate using the handrails and goes slowly. Patient does well with the vaspnuematic device with a sheet on her to keep her warm. Patient continues to come into therapy without an assistive device. Patient had an ultrasound for DVT and it was negative. Patient will benefit from skilled therapy to address right knee flexibility, strength, gait, and edema management.    Personal Factors and Comorbidities  Age;Comorbidity 2    Comorbidities  HTN, COPD    Examination-Activity Limitations  Bed Mobility;Stairs;Locomotion Level    Stability/Clinical Decision Making  Stable/Uncomplicated    Rehab Potential  Excellent    PT Frequency  2x / week    PT Duration  8 weeks    PT Treatment/Interventions  ADLs/Self Care Home Management;Cryotherapy;Electrical Stimulation;Neuromuscular re-education;Balance training;Therapeutic exercise;Therapeutic activities;Functional mobility training;Gait training;Stair training;Patient/family education;Manual techniques;Taping;Vasopneumatic Device;Joint Manipulations    PT Next Visit Plan  Retrograde massage for edema; gait with cane; LE strength - focus on SLR, step-ups,  hip flexion strength to begin with for bed mobility; see if using game ready with a sheet on her helped, add some balance exercises    PT Home Exercise Plan  [id number]    Consulted and Agree with Plan of Care  Patient       Patient will benefit from skilled therapeutic intervention in order to improve the following deficits and impairments:  Abnormal gait, Decreased range of motion, Pain, Impaired flexibility, Decreased strength,  Increased edema  Visit  Diagnosis: Stiffness of right knee, not elsewhere classified  Localized edema  Other abnormalities of gait and mobility  Muscle weakness (generalized)  Acute pain of right knee     Problem List Patient Active Problem List   Diagnosis Date Noted  . Status post total right knee replacement 01/01/2020  . Primary osteoarthritis of right knee 11/14/2019  . Neck pain 12/06/2018  . Chronic pain of right knee 08/07/2016    Earlie Counts, PT 02/15/20 10:15 AM   [facility] [address], [place], [place], [address] Phone: [phone]   Fax:  [phone]  Name: Blaikley Legrow MRN: [id number] Date of Birth: [birth year]

## 2020-02-21 ENCOUNTER — Ambulatory Visit: Payer: Medicare Other | Attending: Orthopaedic Surgery | Admitting: Physical Therapy

## 2020-02-21 ENCOUNTER — Other Ambulatory Visit: Payer: Self-pay

## 2020-02-21 DIAGNOSIS — M25561 Pain in right knee: Secondary | ICD-10-CM | POA: Diagnosis present

## 2020-02-21 DIAGNOSIS — M6281 Muscle weakness (generalized): Secondary | ICD-10-CM | POA: Diagnosis present

## 2020-02-21 DIAGNOSIS — M25661 Stiffness of right knee, not elsewhere classified: Secondary | ICD-10-CM | POA: Diagnosis not present

## 2020-02-21 DIAGNOSIS — R600 Localized edema: Secondary | ICD-10-CM | POA: Insufficient documentation

## 2020-02-21 DIAGNOSIS — R2689 Other abnormalities of gait and mobility: Secondary | ICD-10-CM | POA: Diagnosis present

## 2020-02-21 NOTE — Therapy (Signed)
[facility] [address], [place], [place], [address] Phone: [phone]   Fax:  [phone]  Physical Therapy Treatment  Patient Details  Name: Kathryn [name] MRN: [id number] Date of Birth: Nov 21, 1934 Referring Provider (PT): Frankey Shown   Encounter Date: 02/21/2020  PT End of Session - 02/21/20 1230    Visit Number  8    Date for PT Re-Evaluation  03/21/20    Authorization Type  MCR/MCD    Progress Note Due on Visit  10    PT Start Time  [id number]    PT Stop Time  1315    PT Time Calculation (min)  45 min       Past Medical History:  Diagnosis Date  . Arthritis   . COPD (chronic obstructive pulmonary disease) ([place])   . Dyspnea   . Hypertension   . Hypothyroidism   . Osteopenia   . Pneumonia   . Pulmonary embolism ([place]) 04/2018  . Thyroid disease     Past Surgical History:  Procedure Laterality Date  . APPENDECTOMY    . EYE SURGERY    . TONSILLECTOMY AND ADENOIDECTOMY    . TOTAL KNEE ARTHROPLASTY Right 01/01/2020   Procedure: RIGHT TOTAL KNEE ARTHROPLASTY;  Surgeon: Leandrew Koyanagi, MD;  Location: [facility];  Service: Orthopedics;  Laterality: Right;  . TUBAL LIGATION      There were no vitals filed for this visit.  Subjective Assessment - 02/21/20 1233    Subjective  Feel slik eI am dragging around a heavy tree trunk.    Pertinent History  HTN, COPD, neck pain    Currently in Pain?  Yes    Pain Location  Knee    Pain Orientation  Right    Pain Descriptors / Indicators  Numbness;Dull;Aching    Aggravating Factors   walking    Pain Relieving Factors  ice, elevation    Multiple Pain Sites  No                        [facility] Adult PT Treatment/Exercise - 02/21/20 0001      Knee/Hip Exercises: Aerobic   Nustep  Level 2 x 10 minutes while assessing patient      Knee/Hip Exercises: Supine   Short Arc Quad Sets  Strengthening;Right;2 sets;10 reps    Short Arc Quad Sets Limitations  4#     Heel Slides  Strengthening;Right;2 sets;10 reps    Heel Slides Limitations  with 4# wt on ankle and slider on foot    Bridges  --   Attempted isometric glute squeezes but pt could not do/cramp   Straight Leg Raises  Strengthening;Right;1 set;10 reps   2#,    Straight Leg Raises Limitations  VC to not brace with Lt hamstring as she would cramp       Vasopneumatic   Number Minutes Vasopneumatic   15 minutes   placed sheet on patient due to being cold   Vasopnuematic Location   Knee    Vasopneumatic Pressure  Medium    Vasopneumatic Temperature   34 deg      Manual Therapy   Manual Therapy  --    Edema Management  --      Ankle Exercises: Supine   Other Supine Ankle Exercises  Ankle pumps 20x               PT Short Term Goals - 02/08/20 [id number]  PT SHORT TERM GOAL #1   Title  Ind with initial HEP for ROM and strength.    Status  Achieved      PT SHORT TERM GOAL #2   Title  Patient able to safely amb 300 ft with [facility]    Status  Achieved      PT SHORT TERM GOAL #3   Title  Patient able to climb stairs safely to her bedroom.    Baseline  Pt has done this the past 4 nights    Status  Achieved        PT Long Term Goals - 02/12/20 1612      PT LONG TERM GOAL #1   Title  Patient to demo improved knee ROM to 0-120 or greater to normalize gait and ADLs.    Time  8    Period  Weeks    Status  Achieved            Plan - 02/21/20 1234    Clinical Impression Statement  Pt reports nothing much different in regards to her swelling, she feel sthe retrograde massage does not have any lasting effects on her edema. She was able to increase some resistance with her quad exercises, the other exercises pt had alot of troubles with hamstring cramping today and they kept her from finishing them. Pt feels the Game ready helps a lot. Sees MD in July.    Personal Factors and Comorbidities  Age;Comorbidity 2    Comorbidities  HTN, COPD    Examination-Activity Limitations  Bed  Mobility;Stairs;Locomotion Level    Stability/Clinical Decision Making  Stable/Uncomplicated    Rehab Potential  Excellent    PT Frequency  2x / week    PT Duration  8 weeks    PT Treatment/Interventions  ADLs/Self Care Home Management;Cryotherapy;Electrical Stimulation;Neuromuscular re-education;Balance training;Therapeutic exercise;Therapeutic activities;Functional mobility training;Gait training;Stair training;Patient/family education;Manual techniques;Taping;Vasopneumatic Device;Joint Manipulations    PT Next Visit Plan  RTLE strength and standing balance.    PT Home Exercise Plan  [id number]    Consulted and Agree with Plan of Care  Patient       Patient will benefit from skilled therapeutic intervention in order to improve the following deficits and impairments:  Abnormal gait, Decreased range of motion, Pain, Impaired flexibility, Decreased strength, Increased edema  Visit Diagnosis: Stiffness of right knee, not elsewhere classified  Localized edema  Other abnormalities of gait and mobility  Muscle weakness (generalized)  Acute pain of right knee     Problem List Patient Active Problem List   Diagnosis Date Noted  . Status post total right knee replacement 01/01/2020  . Primary osteoarthritis of right knee 11/14/2019  . Neck pain 12/06/2018  . Chronic pain of right knee 08/07/2016    Rionna Feltes, PTA 02/21/2020, 1:03 PM  [facility] [address] [place], [place], [address] Phone: [phone]   Fax:  [phone]  Name: Elpha Ryon MRN: [id number] Date of Birth: Oct 26, 1934

## 2020-02-23 ENCOUNTER — Encounter: Payer: Self-pay | Admitting: Physical Therapy

## 2020-02-23 ENCOUNTER — Other Ambulatory Visit: Payer: Self-pay

## 2020-02-23 ENCOUNTER — Ambulatory Visit: Payer: Medicare Other | Admitting: Physical Therapy

## 2020-02-23 DIAGNOSIS — M6281 Muscle weakness (generalized): Secondary | ICD-10-CM

## 2020-02-23 DIAGNOSIS — M25661 Stiffness of right knee, not elsewhere classified: Secondary | ICD-10-CM | POA: Diagnosis not present

## 2020-02-23 DIAGNOSIS — R600 Localized edema: Secondary | ICD-10-CM

## 2020-02-23 DIAGNOSIS — R2689 Other abnormalities of gait and mobility: Secondary | ICD-10-CM

## 2020-02-23 DIAGNOSIS — M25561 Pain in right knee: Secondary | ICD-10-CM

## 2020-02-23 NOTE — Therapy (Signed)
[facility] [address] [place], [place], [address] Phone: [phone]   Fax:  [phone]  Physical Therapy Treatment  Patient Details  Name: Kathryn [name] MRN: [id number] Date of Birth: [birth year] Referring Provider (PT): Frankey Shown   Encounter Date: 02/23/2020  PT End of Session - 02/23/20 1010    Visit Number  9    Date for PT Re-Evaluation  03/21/20    Authorization Type  MCR/MCD    Progress Note Due on Visit  10    PT Start Time  1009    PT Stop Time  1105    PT Time Calculation (min)  56 min    Activity Tolerance  Patient tolerated treatment well    Behavior During Therapy  [facility] for tasks assessed/performed       Past Medical History:  Diagnosis Date  . Arthritis   . COPD (chronic obstructive pulmonary disease) ([place])   . Dyspnea   . Hypertension   . Hypothyroidism   . Osteopenia   . Pneumonia   . Pulmonary embolism ([place]) 04/2018  . Thyroid disease     Past Surgical History:  Procedure Laterality Date  . APPENDECTOMY    . EYE SURGERY    . TONSILLECTOMY AND ADENOIDECTOMY    . TOTAL KNEE ARTHROPLASTY Right 01/01/2020   Procedure: RIGHT TOTAL KNEE ARTHROPLASTY;  Surgeon: Leandrew Koyanagi, MD;  Location: [facility];  Service: Orthopedics;  Laterality: Right;  . TUBAL LIGATION      There were no vitals filed for this visit.  Subjective Assessment - 02/23/20 1012    Subjective  No pain this AM just knee stiffness. Edema in lower leg appears better than last session ( it is early in AM when edema tends to be better for pt) but foot remains pretty swollen. Pt not wearing any compression garments this AM.    Pertinent History  HTN, COPD, neck pain    Currently in Pain?  No/denies    Multiple Pain Sites  No         [facility] PT Assessment - 02/23/20 0001      Circumferential Edema   Circumferential - Right  43   Knee                   [facility] Adult PT Treatment/Exercise - 02/23/20 0001       Knee/Hip Exercises: Aerobic   Nustep  Level 2 x 10 minutes while assessing patient      Knee/Hip Exercises: Standing   Heel Raises  Both;1 set;10 reps    Lateral Step Up  Right;1 set;Hand Hold: 2;Step Height: 6"    Forward Step Up  Right;1 set;10 reps;Hand Hold: 2;Step Height: 6"    SLS  SLS RT with LTLE toe taps on first step 10x2       Knee/Hip Exercises: Seated   Ball Squeeze  10x 5 sec hold      Knee/Hip Exercises: Supine   Short Arc Quad Sets  Strengthening;Right;2 sets;15 reps    Short Arc Quad Sets Limitations  4#    Heel Slides  Strengthening;Right;2 sets;10 reps    Heel Slides Limitations  with 4# wt on ankle and slider on foot    Straight Leg Raises  Strengthening;Right;2 sets;10 reps    Straight Leg Raises Limitations  Attempted 2# but took off after 3 reps. Pt kept cramping in her Lt hamstring. No cramping with 0#      Vasopneumatic  Number Minutes Vasopneumatic   15 minutes   placed sheet on patient due to being cold   Vasopnuematic Location   Knee    Vasopneumatic Pressure  Medium    Vasopneumatic Temperature   34 deg      Ankle Exercises: Supine   Other Supine Ankle Exercises  Ankle pumps 20x   After performing the standing ex.              PT Short Term Goals - 02/08/20 0809      PT SHORT TERM GOAL #1   Title  Ind with initial HEP for ROM and strength.    Status  Achieved      PT SHORT TERM GOAL #2   Title  Patient able to safely amb 300 ft with [facility]    Status  Achieved      PT SHORT TERM GOAL #3   Title  Patient able to climb stairs safely to her bedroom.    Baseline  Pt has done this the past 4 nights    Status  Achieved        PT Long Term Goals - 02/12/20 1612      PT LONG TERM GOAL #1   Title  Patient to demo improved knee ROM to 0-120 or greater to normalize gait and ADLs.    Time  8    Period  Weeks    Status  Achieved            Plan - 02/23/20 1011    Clinical Impression Statement  Pt arrives painfree with some  "knee stiffness." Pt's RTLE appears slightly less swollen but foot remains unchanged. Rt knee circumferential measurements improved since initial eval.  Pt reports she feels her wraps and garments do not help and in fact make the swelling worse. She did not tolerate light resistance with SAQ as she kept cramping in her Lt hamstring. No cramps with no resistance. Pt displayed good strength and control with steps and sit to stand.    Personal Factors and Comorbidities  Age;Comorbidity 2    Comorbidities  HTN, COPD    Examination-Activity Limitations  Bed Mobility;Stairs;Locomotion Level    Stability/Clinical Decision Making  Stable/Uncomplicated    Rehab Potential  Excellent    PT Frequency  2x / week    PT Duration  8 weeks    PT Treatment/Interventions  ADLs/Self Care Home Management;Cryotherapy;Electrical Stimulation;Neuromuscular re-education;Balance training;Therapeutic exercise;Therapeutic activities;Functional mobility training;Gait training;Stair training;Patient/family education;Manual techniques;Taping;Vasopneumatic Device;Joint Manipulations    PT Next Visit Plan  10th visit PN due next, consider th epossibility of using the game ready device on her ankle/foot?    PT Home Exercise Plan  [id number]    Consulted and Agree with Plan of Care  Patient       Patient will benefit from skilled therapeutic intervention in order to improve the following deficits and impairments:  Abnormal gait, Decreased range of motion, Pain, Impaired flexibility, Decreased strength, Increased edema  Visit Diagnosis: Stiffness of right knee, not elsewhere classified  Localized edema  Other abnormalities of gait and mobility  Muscle weakness (generalized)  Acute pain of right knee     Problem List Patient Active Problem List   Diagnosis Date Noted  . Status post total right knee replacement 01/01/2020  . Primary osteoarthritis of right knee 11/14/2019  . Neck pain 12/06/2018  . Chronic pain of right  knee 08/07/2016    Sariyah Corcino, PTA 02/23/2020, 10:52 AM  [facility] [address], [place], [place], [address] Phone: [phone]   Fax:  [phone]  Name: Kathryn [name] MRN: [id number] Date of Birth: 1935-02-17

## 2020-02-26 ENCOUNTER — Ambulatory Visit: Payer: Medicare Other | Admitting: Physical Therapy

## 2020-02-26 ENCOUNTER — Other Ambulatory Visit: Payer: Self-pay

## 2020-02-26 DIAGNOSIS — R600 Localized edema: Secondary | ICD-10-CM

## 2020-02-26 DIAGNOSIS — M25561 Pain in right knee: Secondary | ICD-10-CM

## 2020-02-26 DIAGNOSIS — M25661 Stiffness of right knee, not elsewhere classified: Secondary | ICD-10-CM

## 2020-02-26 DIAGNOSIS — M6281 Muscle weakness (generalized): Secondary | ICD-10-CM

## 2020-02-26 DIAGNOSIS — R2689 Other abnormalities of gait and mobility: Secondary | ICD-10-CM

## 2020-02-26 NOTE — Therapy (Addendum)
[facility] [address] [place], [place], [address] Phone: [phone]   Fax:  [phone]  Physical Therapy Treatment  Patient Details  Name: Kathryn [name] MRN: [id number] Date of Birth: 21-Aug-1935 Referring Provider (PT): Frankey Shown   Encounter Date: 02/26/2020 Progress Note Reporting Period 01/25/20 to 02/26/2020  See note below for Objective Data and Assessment of Progress/Goals.  Sigurd Sos, PT 02/26/20 1:45 PM    PT End of Session - 02/26/20 1147    Visit Number  10    Date for PT Re-Evaluation  03/21/20    Authorization Type  MCR/MCD    Progress Note Due on Visit  10    PT Start Time  1146    PT Stop Time  1228    PT Time Calculation (min)  42 min    Equipment Utilized During Treatment  Gait belt    Activity Tolerance  Patient tolerated treatment well    Behavior During Therapy  WFL for tasks assessed/performed       Past Medical History:  Diagnosis Date  . Arthritis   . COPD (chronic obstructive pulmonary disease) ([place])   . Dyspnea   . Hypertension   . Hypothyroidism   . Osteopenia   . Pneumonia   . Pulmonary embolism ([place]) 04/2018  . Thyroid disease     Past Surgical History:  Procedure Laterality Date  . APPENDECTOMY    . EYE SURGERY    . TONSILLECTOMY AND ADENOIDECTOMY    . TOTAL KNEE ARTHROPLASTY Right 01/01/2020   Procedure: RIGHT TOTAL KNEE ARTHROPLASTY;  Surgeon: Leandrew Koyanagi, MD;  Location: [place];  Service: Orthopedics;  Laterality: Right;  . TUBAL LIGATION      There were no vitals filed for this visit.      [facility] PT Assessment - 02/26/20 0001      Assessment   Medical Diagnosis  primary OA Rt knee; s/p Rt TKA    Referring Provider (PT)  Naiping Xu    Onset Date/Surgical Date  01/01/20      Observation/Other Assessments   Focus on Therapeutic Outcomes (FOTO)   48% limitation      Circumferential Edema   Circumferential - Right  43   Knee     PROM   Right Knee Extension  0    Right Knee Flexion  110      Strength   Right Knee Flexion  4+/5    Right Knee Extension  4/5                    [facility] Adult PT Treatment/Exercise - 02/26/20 0001      Knee/Hip Exercises: Aerobic   Nustep  L3 10 min PTA present       Vasopneumatic   Number Minutes Vasopneumatic   15 minutes   placed sheet on patient due to being cold   Vasopnuematic Location   Knee;Ankle    Vasopneumatic Pressure  Medium    Vasopneumatic Temperature   3 flakes sheet for UE               PT Short Term Goals - 02/26/20 1151      PT SHORT TERM GOAL #1   Title  Ind with initial HEP for ROM and strength.    Time  2    Period  Weeks    Status  Achieved    Target Date  02/08/20      PT SHORT TERM GOAL #  2   Title  Patient able to safely amb 300 ft with SPC    Time  2    Period  Weeks    Status  Achieved      PT SHORT TERM GOAL #3   Title  Patient able to climb stairs safely to her bedroom.    Time  4    Period  Weeks    Status  Achieved    Target Date  02/22/20      PT SHORT TERM GOAL #4   Title  Patient to report 50% less discomfort in her lower leg and ankle due to reduced swelling.    Time  4    Period  Weeks    Status  Not Met   Pt reports no changes       PT Long Term Goals - 02/26/20 1152      PT LONG TERM GOAL #1   Title  Patient to demo improved knee ROM to 0-120 or greater to normalize gait and ADLs.    Time  8    Period  Weeks    Status  Partially Met   Knee flexion was only 110 degrees today     PT LONG TERM GOAL #2   Title  Patient able to climb stairs with a reciprocal gait pattern.    Time  8    Period  Weeks    Status  Partially Met   Can do but not very consistently     PT LONG TERM GOAL #3   Title  Patient able to safely walk community distances without AD.    Time  8    Period  Weeks    Status  Achieved      PT LONG TERM GOAL #4   Title  Patient to demo 5/5 right knee strength and 4+/5 or greater bil  hip strength to normalize ADLS    Time  8    Period  Weeks    Status  On-going      PT LONG TERM GOAL #5   Title  Improved FOTO score to <= 44% limitations    Time  8    Period  Weeks    Status  On-going            Plan - 02/26/20 1148    Clinical Impression Statement  Pt arrives fatigued from not sleeping well last night. She has to urinate very frequently throughout the night so she only gets a "few cat naps." Pt did request not to over work today prior to beginning session. Not much knee pain to speak of, RTLE edema remains her biggest complaint. Spoke with PT regarding trying a duo Game ready treatment: knee and foot/ankle. PT agreed to plan and this was th ebulk of todays treatment. PTA did encourage pt to call her surgeon regarding her unchanging edema especially in the foot. MMT of hamstrings slightly improved, knee flexion did not measure as good as her previous measurement. Pt did report it felt "stiff" today. Pt slept while on Game Ready.    Personal Factors and Comorbidities  Age;Comorbidity 2    Comorbidities  HTN, COPD    Examination-Activity Limitations  Bed Mobility;Stairs;Locomotion Level    Stability/Clinical Decision Making  Stable/Uncomplicated    Rehab Potential  Excellent    PT Frequency  2x / week    PT Duration  8 weeks    PT Treatment/Interventions  ADLs/Self Care Home Management;Cryotherapy;Electrical Stimulation;Neuromuscular re-education;Balance training;Therapeutic exercise;Therapeutic activities;Functional  mobility training;Gait training;Stair training;Patient/family education;Manual techniques;Taping;Vasopneumatic Device;Joint Manipulations    PT Next Visit Plan  Send 10th visit PN. See what pt felt about game ready on both knee and ankle/foot. Did she call MD about continued foot edema?    PT Home Exercise Plan  [id number]    Consulted and Agree with Plan of Care  Patient       Patient will benefit from skilled therapeutic intervention in order to  improve the following deficits and impairments:  Abnormal gait, Decreased range of motion, Pain, Impaired flexibility, Decreased strength, Increased edema  Visit Diagnosis: Stiffness of right knee, not elsewhere classified  Localized edema  Other abnormalities of gait and mobility  Muscle weakness (generalized)  Acute pain of right knee     Problem List Patient Active Problem List   Diagnosis Date Noted  . Status post total right knee replacement 01/01/2020  . Primary osteoarthritis of right knee 11/14/2019  . Neck pain 12/06/2018  . Chronic pain of right knee 08/07/2016   Myrene Galas, PTA 02/26/20 12:30 PM  [facility] [address], [place], [place], [address] Phone: [phone]   Fax:  [phone]  Name: Kathryn [name] MRN: [id number] Date of Birth: 1935-05-01

## 2020-02-27 ENCOUNTER — Telehealth: Payer: Self-pay | Admitting: Orthopaedic Surgery

## 2020-02-27 NOTE — Telephone Encounter (Signed)
Patient called stating that she is still having a lot of fluid, but it is worse in her foot.  She wanted to know what she needs to do to get it out.  CB#[phone].  Thank you.

## 2020-02-27 NOTE — Telephone Encounter (Signed)
Is she wearing compression socks during the day?

## 2020-02-28 ENCOUNTER — Encounter: Payer: Self-pay | Admitting: Physical Therapy

## 2020-02-28 ENCOUNTER — Ambulatory Visit: Payer: Medicare Other | Admitting: Physical Therapy

## 2020-02-28 ENCOUNTER — Other Ambulatory Visit: Payer: Self-pay

## 2020-02-28 DIAGNOSIS — M25661 Stiffness of right knee, not elsewhere classified: Secondary | ICD-10-CM

## 2020-02-28 DIAGNOSIS — M25561 Pain in right knee: Secondary | ICD-10-CM

## 2020-02-28 DIAGNOSIS — M6281 Muscle weakness (generalized): Secondary | ICD-10-CM

## 2020-02-28 DIAGNOSIS — R600 Localized edema: Secondary | ICD-10-CM

## 2020-02-28 DIAGNOSIS — R2689 Other abnormalities of gait and mobility: Secondary | ICD-10-CM

## 2020-02-28 NOTE — Telephone Encounter (Signed)
Cannot wear compression sock- hurts to wear them. Tried ace wraps didn't help . Nothing she does helps. Its been 8 weeks. She is very desperate to have something done. Would like fluid aspirated. Elevates leg all the time.

## 2020-02-28 NOTE — Therapy (Signed)
[facility] [address], [place], [place], [address] Phone: [phone]   Fax:  [phone]  Physical Therapy Treatment  Patient Details  Name: Kathryn [name] MRN: [id number] Date of Birth: [birth year] Referring Provider (PT): Frankey Shown   Encounter Date: 02/28/2020  PT End of Session - 02/28/20 1234    Visit Number  11    Date for PT Re-Evaluation  03/21/20    Authorization Type  MCR/MCD    PT Start Time  1228    PT Stop Time  1310    PT Time Calculation (min)  42 min    Activity Tolerance  Patient tolerated treatment well    Behavior During Therapy  [facility] for tasks assessed/performed       Past Medical History:  Diagnosis Date  . Arthritis   . COPD (chronic obstructive pulmonary disease) ([place])   . Dyspnea   . Hypertension   . Hypothyroidism   . Osteopenia   . Pneumonia   . Pulmonary embolism ([place]) 04/2018  . Thyroid disease     Past Surgical History:  Procedure Laterality Date  . APPENDECTOMY    . EYE SURGERY    . TONSILLECTOMY AND ADENOIDECTOMY    . TOTAL KNEE ARTHROPLASTY Right 01/01/2020   Procedure: RIGHT TOTAL KNEE ARTHROPLASTY;  Surgeon: Leandrew Koyanagi, MD;  Location: [place];  Service: Orthopedics;  Laterality: Right;  . TUBAL LIGATION      There were no vitals filed for this visit.  Subjective Assessment - 02/28/20 1235    Subjective  Slept slightly better last night, not as fatigued today. Foot apprears very swollen today, the effect from the Game ready did not last very long. i called the doctor and am awaiting their call back.    Pertinent History  HTN, COPD, neck pain    Currently in Pain?  No/denies                        [facility] Adult PT Treatment/Exercise - 02/28/20 0001      Knee/Hip Exercises: Aerobic   Nustep  L3 10 min PTA present       Knee/Hip Exercises: Seated   Long Arc Quad  Strengthening;Right;3 sets;10 reps;Weights    Long Arc Quad Weight  3 lbs.     Hamstring Curl  Strengthening;Right;3 sets;10 reps    Hamstring Limitations  red band      Knee/Hip Exercises: Supine   Short Arc Quad Sets  Strengthening;Right;2 sets;15 reps    Short Arc Quad Sets Limitations  4#    Straight Leg Raises  Strengthening;Right;2 sets   5 reps, no cramping     Vasopneumatic   Number Minutes Vasopneumatic   15 minutes   placed sheet on patient due to being cold   Vasopnuematic Location   Knee;Ankle    Vasopneumatic Pressure  Medium    Vasopneumatic Temperature   3 flakes sheet for UE               PT Short Term Goals - 02/26/20 1151      PT SHORT TERM GOAL #1   Title  Ind with initial HEP for ROM and strength.    Time  2    Period  Weeks    Status  Achieved    Target Date  02/08/20      PT SHORT TERM GOAL #2   Title  Patient able to safely amb 300 ft with [facility]  Time  2    Period  Weeks    Status  Achieved      PT SHORT TERM GOAL #3   Title  Patient able to climb stairs safely to her bedroom.    Time  4    Period  Weeks    Status  Achieved    Target Date  02/22/20      PT SHORT TERM GOAL #4   Title  Patient to report 50% less discomfort in her lower leg and ankle due to reduced swelling.    Time  4    Period  Weeks    Status  Not Met   Pt reports no changes       PT Long Term Goals - 02/26/20 1152      PT LONG TERM GOAL #1   Title  Patient to demo improved knee ROM to 0-120 or greater to normalize gait and ADLs.    Time  8    Period  Weeks    Status  Partially Met   Knee flexion was only 110 degrees today     PT LONG TERM GOAL #2   Title  Patient able to climb stairs with a reciprocal gait pattern.    Time  8    Period  Weeks    Status  Partially Met   Can do but not very consistently     PT LONG TERM GOAL #3   Title  Patient able to safely walk community distances without AD.    Time  8    Period  Weeks    Status  Achieved      PT LONG TERM GOAL #4   Title  Patient to demo 5/5 right knee strength and  4+/5 or greater bil hip strength to normalize ADLS    Time  8    Period  Weeks    Status  On-going      PT LONG TERM GOAL #5   Title  Improved FOTO score to <= 44% limitations    Time  8    Period  Weeks    Status  On-going            Plan - 02/28/20 1234    Clinical Impression Statement  Pt reports she is pleased with her mobility and strength regarding her knee. her only complaint is her LE swelling.She currently has a call into her MD and is awaiting a call back. She would like to conitnue with PT until the MD sees her. She feels the GAme ready gives her some movement in the fluid but unfortunently returns to previous level.    Personal Factors and Comorbidities  Age;Comorbidity 2    Comorbidities  HTN, COPD    Examination-Activity Limitations  Bed Mobility;Stairs;Locomotion Level    Stability/Clinical Decision Making  Stable/Uncomplicated    Rehab Potential  Excellent    PT Frequency  2x / week    PT Duration  8 weeks    PT Treatment/Interventions  ADLs/Self Care Home Management;Cryotherapy;Electrical Stimulation;Neuromuscular re-education;Balance training;Therapeutic exercise;Therapeutic activities;Functional mobility training;Gait training;Stair training;Patient/family education;Manual techniques;Taping;Vasopneumatic Device;Joint Manipulations    PT Next Visit Plan  Check to see if MD office called her back and if she will be seen. Continue to work on knee strength and flexion ROM.    PT Home Exercise Plan  [id number]    Consulted and Agree with Plan of Care  Patient       Patient will benefit from skilled therapeutic intervention in  order to improve the following deficits and impairments:  Abnormal gait, Decreased range of motion, Pain, Impaired flexibility, Decreased strength, Increased edema  Visit Diagnosis: Stiffness of right knee, not elsewhere classified  Localized edema  Other abnormalities of gait and mobility  Muscle weakness (generalized)  Acute pain of  right knee     Problem List Patient Active Problem List   Diagnosis Date Noted  . Status post total right knee replacement 01/01/2020  . Primary osteoarthritis of right knee 11/14/2019  . Neck pain 12/06/2018  . Chronic pain of right knee 08/07/2016    Finesse Fielder, PTA 02/28/2020, 1:03 PM  [facility] [address], [place], [place], [address] Phone: [phone]   Fax:  [phone]  Name: Maple Odaniel MRN: [id number] Date of Birth: [birth year]

## 2020-02-28 NOTE — Telephone Encounter (Signed)
Appt made

## 2020-02-28 NOTE — Telephone Encounter (Signed)
Sure have her come in some time this week for eval

## 2020-02-29 ENCOUNTER — Encounter: Payer: Self-pay | Admitting: Orthopaedic Surgery

## 2020-02-29 ENCOUNTER — Ambulatory Visit (INDEPENDENT_AMBULATORY_CARE_PROVIDER_SITE_OTHER): Payer: Medicare Other | Admitting: Orthopaedic Surgery

## 2020-02-29 ENCOUNTER — Other Ambulatory Visit: Payer: Self-pay

## 2020-02-29 DIAGNOSIS — Z96651 Presence of right artificial knee joint: Secondary | ICD-10-CM

## 2020-02-29 DIAGNOSIS — R609 Edema, unspecified: Secondary | ICD-10-CM

## 2020-02-29 NOTE — Progress Notes (Signed)
   Office Visit Note   Patient: Kathryn [name]           Date of Birth: 1935-04-19           MRN: [id number] Visit Date: 02/29/2020              Requested by: Mayra Neer, MD [address] [place],  [place] [address] PCP: Mayra Neer, MD   Assessment & Plan: Visit Diagnoses:  1. Pitting edema   2. Status post total right knee replacement     Plan: We discussed at length that in order to reduce the swelling she will need to apply some sort of compression to the right leg.  She seems to be fairly resistant to wearing compression devices as they are irritating and bothersome but I told her that there are no good alternatives unfortunately.  She will try TED hose again and some compression socks.  Follow-up as scheduled.  Follow-Up Instructions: Return if symptoms worsen or fail to improve.   Orders:  No orders of the defined types were placed in this encounter.  No orders of the defined types were placed in this encounter.     Procedures: No procedures performed   Clinical Data: No additional findings.   Subjective: Chief Complaint  Patient presents with  . Right Knee - Edema, Pain    [name] is a week status post right total knee replacement.  She continues to complain of swelling of her right leg.  She states that she could not tolerate the TED hose nor the ace bandage.  She is concerned about her swelling.  She is having trouble with shoe wear.  She had a Doppler that was negative.   Review of Systems   Objective: Vital Signs: There were no vitals taken for this visit.  Physical Exam  Ortho Exam Right foot ankle and lower leg show 2+ pitting edema.  No neurovascular compromise. Specialty Comments:  No specialty comments available.  Imaging: No results found.   PMFS History: Patient Active Problem List   Diagnosis Date Noted  . Status post total right knee replacement 01/01/2020  . Primary osteoarthritis of right knee  11/14/2019  . Neck pain 12/06/2018  . Chronic pain of right knee 08/07/2016   Past Medical History:  Diagnosis Date  . Arthritis   . COPD (chronic obstructive pulmonary disease) ([place])   . Dyspnea   . Hypertension   . Hypothyroidism   . Osteopenia   . Pneumonia   . Pulmonary embolism ([place]) 04/2018  . Thyroid disease     Family History  Problem Relation Age of Onset  . Breast cancer Mother   . Breast cancer Daughter 60    Past Surgical History:  Procedure Laterality Date  . APPENDECTOMY    . EYE SURGERY    . TONSILLECTOMY AND ADENOIDECTOMY    . TOTAL KNEE ARTHROPLASTY Right 01/01/2020   Procedure: RIGHT TOTAL KNEE ARTHROPLASTY;  Surgeon: Leandrew Koyanagi, MD;  Location: [place];  Service: Orthopedics;  Laterality: Right;  . TUBAL LIGATION     Social History   Occupational History  . Not on file  Tobacco Use  . Smoking status: Former Smoker    Quit date: 2018    Years since quitting: 3.4  . Smokeless tobacco: Never Used  Substance and Sexual Activity  . Alcohol use: Never  . Drug use: Never  . Sexual activity: Not on file

## 2020-03-04 ENCOUNTER — Encounter: Payer: Self-pay | Admitting: Physical Therapy

## 2020-03-04 ENCOUNTER — Ambulatory Visit: Payer: Medicare Other | Admitting: Physical Therapy

## 2020-03-04 ENCOUNTER — Other Ambulatory Visit: Payer: Self-pay

## 2020-03-04 DIAGNOSIS — M25561 Pain in right knee: Secondary | ICD-10-CM

## 2020-03-04 DIAGNOSIS — M25661 Stiffness of right knee, not elsewhere classified: Secondary | ICD-10-CM

## 2020-03-04 DIAGNOSIS — R2689 Other abnormalities of gait and mobility: Secondary | ICD-10-CM

## 2020-03-04 DIAGNOSIS — R600 Localized edema: Secondary | ICD-10-CM

## 2020-03-04 DIAGNOSIS — M6281 Muscle weakness (generalized): Secondary | ICD-10-CM

## 2020-03-04 NOTE — Therapy (Signed)
[facility] [address], [place], [place], [address] Phone: [phone]   Fax:  [phone]  Physical Therapy Treatment  Patient Details  Name: Kathryn [name] MRN: [id number] Date of Birth: 19-Feb-1935 Referring Provider (PT): Frankey Shown   Encounter Date: 03/04/2020   PT End of Session - 03/04/20 1149    Visit Number 12    Date for PT Re-Evaluation 03/21/20    Authorization Type MCR/MCD    Progress Note Due on Visit 20    PT Start Time 1148    PT Stop Time 1230    PT Time Calculation (min) 42 min    Activity Tolerance Patient tolerated treatment well    Behavior During Therapy [facility] for tasks assessed/performed           Past Medical History:  Diagnosis Date  . Arthritis   . COPD (chronic obstructive pulmonary disease) ([place])   . Dyspnea   . Hypertension   . Hypothyroidism   . Osteopenia   . Pneumonia   . Pulmonary embolism ([place]) 04/2018  . Thyroid disease     Past Surgical History:  Procedure Laterality Date  . APPENDECTOMY    . EYE SURGERY    . TONSILLECTOMY AND ADENOIDECTOMY    . TOTAL KNEE ARTHROPLASTY Right 01/01/2020   Procedure: RIGHT TOTAL KNEE ARTHROPLASTY;  Surgeon: Leandrew Koyanagi, MD;  Location: [place];  Service: Orthopedics;  Laterality: Right;  . TUBAL LIGATION      There were no vitals filed for this visit.   Subjective Assessment - 03/04/20 1152    Subjective I saw my MD last Thursday to follow up regarding my edema. MD put pt in new compression garment. Pt has committed to being consistent wearing it and reports she can get it on by herself.    Pertinent History HTN, COPD, neck pain    Currently in Pain? No/denies    Multiple Pain Sites No                             [facility] Adult PT Treatment/Exercise - 03/04/20 0001      Knee/Hip Exercises: Aerobic   Nustep L3 10 min PTA present       Knee/Hip Exercises: Machines for Strengthening   Total Gym Leg Press  Seat 8 BilLE 50# 2x10   VC for control, keep knees apart     Knee/Hip Exercises: Standing   Hip Abduction Stengthening;Right;1 set;15 reps;Knee straight    Forward Step Up Right;1 set;10 reps;Hand Hold: 2;Step Height: 6"   light UE   Step Down Right;1 set;10 reps;Hand Hold: 2;Step Height: 6"      Knee/Hip Exercises: Seated   Long Arc Quad Strengthening;Right;3 sets;10 reps;Weights    Long Arc Quad Weight 4 lbs.    Sit to Sand 1 set;10 reps;without UE support   Sitting on black mat     Vasopneumatic   Number Minutes Vasopneumatic  15 minutes   placed sheet on patient due to being cold   Vasopnuematic Location  Knee;Ankle    Vasopneumatic Pressure Medium    Vasopneumatic Temperature  3 flakes sheet for UE                  PT Education - 03/04/20 1217    Education Details HEP: sit to stand and standing hip abduction    Person(s) Educated Patient    Methods Explanation;Demonstration;Handout    Comprehension Returned demonstration;Verbalized  understanding            PT Short Term Goals - 02/26/20 1151      PT SHORT TERM GOAL #1   Title Ind with initial HEP for ROM and strength.    Time 2    Period Weeks    Status Achieved    Target Date 02/08/20      PT SHORT TERM GOAL #2   Title Patient able to safely amb 300 ft with SPC    Time 2    Period Weeks    Status Achieved      PT SHORT TERM GOAL #3   Title Patient able to climb stairs safely to her bedroom.    Time 4    Period Weeks    Status Achieved    Target Date 02/22/20      PT SHORT TERM GOAL #4   Title Patient to report 50% less discomfort in her lower leg and ankle due to reduced swelling.    Time 4    Period Weeks    Status Not Met   Pt reports no changes            PT Long Term Goals - 02/26/20 1152      PT LONG TERM GOAL #1   Title Patient to demo improved knee ROM to 0-120 or greater to normalize gait and ADLs.    Time 8    Period Weeks    Status Partially Met   Knee flexion was only 110  degrees today     PT LONG TERM GOAL #2   Title Patient able to climb stairs with a reciprocal gait pattern.    Time 8    Period Weeks    Status Partially Met   Can do but not very consistently     PT LONG TERM GOAL #3   Title Patient able to safely walk community distances without AD.    Time 8    Period Weeks    Status Achieved      PT LONG TERM GOAL #4   Title Patient to demo 5/5 right knee strength and 4+/5 or greater bil hip strength to normalize ADLS    Time 8    Period Weeks    Status On-going      PT LONG TERM GOAL #5   Title Improved FOTO score to <= 44% limitations    Time 8    Period Weeks    Status On-going                 Plan - 03/04/20 1150    Clinical Impression Statement Pt saw MD on Thursday to follow up reagarding her edema. MD wants pt to committ to wearing a new compression garment. Pt verbally reports she will committ to wearing it. Pt demonstartes RT hip weakness descending stairs although she reports no issues going up & down stairs to her bedroom . HEp was updated to include hip abduction strength and sit to stand for simple functional strength.    Personal Factors and Comorbidities Age;Comorbidity 2    Comorbidities HTN, COPD    Examination-Activity Limitations Bed Mobility;Stairs;Locomotion Level    Stability/Clinical Decision Making Stable/Uncomplicated    Rehab Potential Excellent    PT Frequency 2x / week    PT Duration 8 weeks    PT Treatment/Interventions ADLs/Self Care Home Management;Cryotherapy;Electrical Stimulation;Neuromuscular re-education;Balance training;Therapeutic exercise;Therapeutic activities;Functional mobility training;Gait training;Stair training;Patient/family education;Manual techniques;Taping;Vasopneumatic Device;Joint Manipulations    PT Next Visit Plan  Functional strengthening, edema managment, RT hip strength    PT Home Exercise Plan [id number]    Consulted and Agree with Plan of Care Patient           Patient  will benefit from skilled therapeutic intervention in order to improve the following deficits and impairments:  Abnormal gait, Decreased range of motion, Pain, Impaired flexibility, Decreased strength, Increased edema  Visit Diagnosis: Stiffness of right knee, not elsewhere classified  Localized edema  Other abnormalities of gait and mobility  Muscle weakness (generalized)  Acute pain of right knee     Problem List Patient Active Problem List   Diagnosis Date Noted  . Status post total right knee replacement 01/01/2020  . Primary osteoarthritis of right knee 11/14/2019  . Neck pain 12/06/2018  . Chronic pain of right knee 08/07/2016    Zyad Boomer, PTA 03/04/2020, 12:21 PM  [facility] [address], [place], [place], [address] Phone: [phone]   Fax:  [phone]  Name: Kathryn [name] MRN: [id number] Date of Birth: 1935-05-20

## 2020-03-06 ENCOUNTER — Other Ambulatory Visit: Payer: Self-pay

## 2020-03-06 ENCOUNTER — Ambulatory Visit: Payer: Medicare Other | Admitting: Physical Therapy

## 2020-03-06 ENCOUNTER — Encounter: Payer: Self-pay | Admitting: Physical Therapy

## 2020-03-06 DIAGNOSIS — M6281 Muscle weakness (generalized): Secondary | ICD-10-CM

## 2020-03-06 DIAGNOSIS — M25561 Pain in right knee: Secondary | ICD-10-CM

## 2020-03-06 DIAGNOSIS — M25661 Stiffness of right knee, not elsewhere classified: Secondary | ICD-10-CM

## 2020-03-06 DIAGNOSIS — R600 Localized edema: Secondary | ICD-10-CM

## 2020-03-06 DIAGNOSIS — R2689 Other abnormalities of gait and mobility: Secondary | ICD-10-CM

## 2020-03-06 NOTE — Therapy (Signed)
[facility] [address] [place], [place], [address] Phone: [phone]   Fax:  [phone]  Physical Therapy Treatment  Patient Details  Name: Kathryn [name] MRN: [id number] Date of Birth: 1935/04/16 Referring Provider (PT): Frankey Shown   Encounter Date: 03/06/2020   PT End of Session - 03/06/20 1149    Visit Number 13    Date for PT Re-Evaluation 03/21/20    Authorization Type MCR/MCD    Progress Note Due on Visit 20    PT Start Time 1145    PT Stop Time 1240    PT Time Calculation (min) 55 min    Activity Tolerance Patient tolerated treatment well    Behavior During Therapy [facility] for tasks assessed/performed           Past Medical History:  Diagnosis Date  . Arthritis   . COPD (chronic obstructive pulmonary disease) ([place])   . Dyspnea   . Hypertension   . Hypothyroidism   . Osteopenia   . Pneumonia   . Pulmonary embolism ([place]) 04/2018  . Thyroid disease     Past Surgical History:  Procedure Laterality Date  . APPENDECTOMY    . EYE SURGERY    . TONSILLECTOMY AND ADENOIDECTOMY    . TOTAL KNEE ARTHROPLASTY Right 01/01/2020   Procedure: RIGHT TOTAL KNEE ARTHROPLASTY;  Surgeon: Leandrew Koyanagi, MD;  Location: [facility];  Service: Orthopedics;  Laterality: Right;  . TUBAL LIGATION      There were no vitals filed for this visit.   Subjective Assessment - 03/06/20 1152    Subjective My foot might be better. I am wearing my sock. Knee is stiff this AM. I got better sleep last night only having to use the bathroom 2x.    Pertinent History HTN, COPD, neck pain    Currently in Pain? No/denies              [facility] PT Assessment - 03/06/20 0001      PROM   Right Knee Extension 0    Right Knee Flexion 120                         [facility] Adult PT Treatment/Exercise - 03/06/20 0001      Knee/Hip Exercises: Aerobic   Nustep L3 10 min PTA present       Knee/Hip Exercises: Standing    Forward Step Up Right;1 set;15 reps;Hand Hold: 2;Step Height: 6"   light UE   Step Down Right;1 set;15 reps;Hand Hold: 2;Step Height: 6"   VC to keep RT foot straight   Other Standing Knee Exercises LTLE taps at stairs: SLS on RTLE    1 hand rail used   Other Standing Knee Exercises Red band side stepping along counter 4x      Knee/Hip Exercises: Seated   Long Arc Quad Strengthening;Right;3 sets;10 reps;Weights    Long Arc Quad Weight 4 lbs.    Sit to Sand 1 set;15 reps;without UE support   Sitting on black mat     Knee/Hip Exercises: Supine   Heel Slides AROM;Right;20 reps      Vasopneumatic   Number Minutes Vasopneumatic  15 minutes   placed sheet on patient due to being cold   Vasopnuematic Location  Knee;Ankle    Vasopneumatic Pressure Medium    Vasopneumatic Temperature  3 flakes sheet for UE  PT Short Term Goals - 02/26/20 1151      PT SHORT TERM GOAL #1   Title Ind with initial HEP for ROM and strength.    Time 2    Period Weeks    Status Achieved    Target Date 02/08/20      PT SHORT TERM GOAL #2   Title Patient able to safely amb 300 ft with SPC    Time 2    Period Weeks    Status Achieved      PT SHORT TERM GOAL #3   Title Patient able to climb stairs safely to her bedroom.    Time 4    Period Weeks    Status Achieved    Target Date 02/22/20      PT SHORT TERM GOAL #4   Title Patient to report 50% less discomfort in her lower leg and ankle due to reduced swelling.    Time 4    Period Weeks    Status Not Met   Pt reports no changes            PT Long Term Goals - 02/26/20 1152      PT LONG TERM GOAL #1   Title Patient to demo improved knee ROM to 0-120 or greater to normalize gait and ADLs.    Time 8    Period Weeks    Status Partially Met   Knee flexion was only 110 degrees today     PT LONG TERM GOAL #2   Title Patient able to climb stairs with a reciprocal gait pattern.    Time 8    Period Weeks    Status  Partially Met   Can do but not very consistently     PT LONG TERM GOAL #3   Title Patient able to safely walk community distances without AD.    Time 8    Period Weeks    Status Achieved      PT LONG TERM GOAL #4   Title Patient to demo 5/5 right knee strength and 4+/5 or greater bil hip strength to normalize ADLS    Time 8    Period Weeks    Status On-going      PT LONG TERM GOAL #5   Title Improved FOTO score to <= 44% limitations    Time 8    Period Weeks    Status On-going                 Plan - 03/06/20 1149    Clinical Impression Statement Pt has increased her compliance with wearin gher TED hose and the top of the foot visually look less swollen this AM as pt walks into the clinic. Pt reports muscles soreness in her hip started this AM but she knows it is from her exercises. Pt resumed her knee flexion AROM back to 120 degrees.    Personal Factors and Comorbidities Age;Comorbidity 2    Comorbidities HTN, COPD    Examination-Activity Limitations Bed Mobility;Stairs;Locomotion Level    Stability/Clinical Decision Making Stable/Uncomplicated    Rehab Potential Excellent    PT Frequency 2x / week    PT Duration 8 weeks    PT Treatment/Interventions ADLs/Self Care Home Management;Cryotherapy;Electrical Stimulation;Neuromuscular re-education;Balance training;Therapeutic exercise;Therapeutic activities;Functional mobility training;Gait training;Stair training;Patient/family education;Manual techniques;Taping;Vasopneumatic Device;Joint Manipulations    PT Next Visit Plan Functional strengthening, edema managment, RT hip strength: KEEP ENCOUAGING PT TO [place] [id number]  Consulted and Agree with Plan of Care Patient           Patient will benefit from skilled therapeutic intervention in order to improve the following deficits and impairments:  Abnormal gait, Decreased range of motion, Pain, Impaired flexibility, Decreased  strength, Increased edema  Visit Diagnosis: Stiffness of right knee, not elsewhere classified  Localized edema  Other abnormalities of gait and mobility  Muscle weakness (generalized)  Acute pain of right knee     Problem List Patient Active Problem List   Diagnosis Date Noted  . Status post total right knee replacement 01/01/2020  . Primary osteoarthritis of right knee 11/14/2019  . Neck pain 12/06/2018  . Chronic pain of right knee 08/07/2016    Amoree Newlon, PTA 03/06/2020, 12:24 PM  [facility] [address] [place], [place], [address] Phone: [phone]   Fax:  [phone]  Name: Tora Prunty MRN: [id number] Date of Birth: 1935-05-28

## 2020-03-11 ENCOUNTER — Ambulatory Visit: Payer: Medicare Other | Admitting: Physical Therapy

## 2020-03-11 ENCOUNTER — Encounter: Payer: Self-pay | Admitting: Physical Therapy

## 2020-03-11 ENCOUNTER — Other Ambulatory Visit: Payer: Self-pay

## 2020-03-11 DIAGNOSIS — M25661 Stiffness of right knee, not elsewhere classified: Secondary | ICD-10-CM | POA: Diagnosis not present

## 2020-03-11 DIAGNOSIS — R600 Localized edema: Secondary | ICD-10-CM

## 2020-03-11 DIAGNOSIS — M25561 Pain in right knee: Secondary | ICD-10-CM

## 2020-03-11 DIAGNOSIS — M6281 Muscle weakness (generalized): Secondary | ICD-10-CM

## 2020-03-11 DIAGNOSIS — R2689 Other abnormalities of gait and mobility: Secondary | ICD-10-CM

## 2020-03-11 NOTE — Therapy (Signed)
[facility] [address] [place], [place], [address] Phone: [phone]   Fax:  [phone]  Physical Therapy Treatment  Patient Details  Name: Kathryn [name] MRN: [id number] Date of Birth: [birth year] Referring Provider (PT): Frankey Shown   Encounter Date: 03/11/2020   PT End of Session - 03/11/20 1147    Visit Number 14    Date for PT Re-Evaluation 03/21/20    Authorization Type MCR/MCD    Progress Note Due on Visit 20    PT Start Time 1146    PT Stop Time 1240    PT Time Calculation (min) 54 min    Activity Tolerance Patient tolerated treatment well           Past Medical History:  Diagnosis Date  . Arthritis   . COPD (chronic obstructive pulmonary disease) ([place])   . Dyspnea   . Hypertension   . Hypothyroidism   . Osteopenia   . Pneumonia   . Pulmonary embolism ([place]) 04/2018  . Thyroid disease     Past Surgical History:  Procedure Laterality Date  . APPENDECTOMY    . EYE SURGERY    . TONSILLECTOMY AND ADENOIDECTOMY    . TOTAL KNEE ARTHROPLASTY Right 01/01/2020   Procedure: RIGHT TOTAL KNEE ARTHROPLASTY;  Surgeon: Leandrew Koyanagi, MD;  Location: [facility];  Service: Orthopedics;  Laterality: Right;  . TUBAL LIGATION      There were no vitals filed for this visit.   Subjective Assessment - 03/11/20 1150    Subjective I wore a shorter compression sock today because i could get my shoe on. PTA advised pt to return to TED hose as her swelling was building up at top of the sock.    Pertinent History HTN, COPD, neck pain    Currently in Pain? No/denies                             [facility] Adult PT Treatment/Exercise - 03/11/20 0001      Knee/Hip Exercises: Aerobic   Nustep L3 10 min PTA present       Knee/Hip Exercises: Machines for Strengthening   Total Gym Leg Press Seat 8 BilLE 60# 3x10   VC for control, keep knees apart     Knee/Hip Exercises: Standing   Lunge Walking -  Round Trips --    Stairs 4 times 4 steps reciprocally    Other Standing Knee Exercises Blue band side stepping along hi lo  table 4x      Knee/Hip Exercises: Seated   Long Arc Quad Strengthening;Right;2 sets;10 reps;Weights    Long Arc Quad Weight 5 lbs.    Clamshell with TheraBand --   Blue 2x20,    Sit to Sand 1 set;15 reps;without UE support   Sitting on black mat     Knee/Hip Exercises: Supine   Other Supine Knee/Hip Exercises Ball squeeze 15x, VC to contract smooth so she doesn't cramp, cocontraction of ball and glutes 5 sec hold 10x      Vasopneumatic   Number Minutes Vasopneumatic  15 minutes   placed sheet on patient due to being cold   Vasopnuematic Location  Knee;Ankle    Vasopneumatic Pressure Medium    Vasopneumatic Temperature  3 flakes sheet for UE                    PT Short Term Goals - 02/26/20 1151  PT SHORT TERM GOAL #1   Title Ind with initial HEP for ROM and strength.    Time 2    Period Weeks    Status Achieved    Target Date 02/08/20      PT SHORT TERM GOAL #2   Title Patient able to safely amb 300 ft with SPC    Time 2    Period Weeks    Status Achieved      PT SHORT TERM GOAL #3   Title Patient able to climb stairs safely to her bedroom.    Time 4    Period Weeks    Status Achieved    Target Date 02/22/20      PT SHORT TERM GOAL #4   Title Patient to report 50% less discomfort in her lower leg and ankle due to reduced swelling.    Time 4    Period Weeks    Status Not Met   Pt reports no changes            PT Long Term Goals - 03/11/20 1229      PT LONG TERM GOAL #2   Title Patient able to climb stairs with a reciprocal gait pattern.    Time 8    Period Weeks    Status Achieved                 Plan - 03/11/20 1153    Clinical Impression Statement Pt continues to do well regarding her RT knee. Pt able to get a shoe on her Rt foot for the first time in a very long time. She did try to make a switch to an ankle  height compression wrap instead of her TED hose. PTA advised pt to change her sock when she gets home due to the swelling building up at the top of her sock. She agreed to do. Pt increased resistance on her quad strengthening exercises without any issues.Gane ready shows effectiveness in edema reduction in addition to pt wearing her compression garments consistently. Pt can go up and down her stairs at home reciprocally. Meeting long term goal.    Personal Factors and Comorbidities Age;Comorbidity 2    Comorbidities HTN, COPD    Examination-Activity Limitations Bed Mobility;Stairs;Locomotion Level    Stability/Clinical Decision Making Stable/Uncomplicated    Rehab Potential Excellent    PT Frequency 2x / week    PT Duration 8 weeks    PT Treatment/Interventions ADLs/Self Care Home Management;Cryotherapy;Electrical Stimulation;Neuromuscular re-education;Balance training;Therapeutic exercise;Therapeutic activities;Functional mobility training;Gait training;Stair training;Patient/family education;Manual techniques;Taping;Vasopneumatic Device;Joint Manipulations    PT Next Visit Plan MMT next, hip and knee strength, Game ready for ankle/foot & knee. Keep encouraging pt to wear her compression garments.    PT Home Exercise Plan [id number]    Consulted and Agree with Plan of Care Patient           Patient will benefit from skilled therapeutic intervention in order to improve the following deficits and impairments:  Abnormal gait, Decreased range of motion, Pain, Impaired flexibility, Decreased strength, Increased edema  Visit Diagnosis: Stiffness of right knee, not elsewhere classified  Localized edema  Other abnormalities of gait and mobility  Muscle weakness (generalized)  Acute pain of right knee     Problem List Patient Active Problem List   Diagnosis Date Noted  . Status post total right knee replacement 01/01/2020  . Primary osteoarthritis of right knee 11/14/2019  . Neck pain  12/06/2018  . Chronic pain of right knee 08/07/2016  Harim Bi, PTA 03/11/2020, 12:32 PM  [facility] [address], [place], [place], [address] Phone: [phone]   Fax:  [phone]  Name: Kathryn [name] MRN: [id number] Date of Birth: July 14, 1935

## 2020-03-13 ENCOUNTER — Ambulatory Visit: Payer: Medicare Other | Admitting: Physical Therapy

## 2020-03-13 ENCOUNTER — Encounter: Payer: Self-pay | Admitting: Physical Therapy

## 2020-03-13 ENCOUNTER — Other Ambulatory Visit: Payer: Self-pay

## 2020-03-13 DIAGNOSIS — M6281 Muscle weakness (generalized): Secondary | ICD-10-CM

## 2020-03-13 DIAGNOSIS — M25661 Stiffness of right knee, not elsewhere classified: Secondary | ICD-10-CM

## 2020-03-13 DIAGNOSIS — R2689 Other abnormalities of gait and mobility: Secondary | ICD-10-CM

## 2020-03-13 DIAGNOSIS — M25561 Pain in right knee: Secondary | ICD-10-CM

## 2020-03-13 DIAGNOSIS — R600 Localized edema: Secondary | ICD-10-CM

## 2020-03-13 NOTE — Therapy (Signed)
[facility] [address], [place], [place], [address] Phone: [phone]   Fax:  [phone]  Physical Therapy Treatment  Patient Details  Name: Kathryn [name] MRN: [id number] Date of Birth: Oct 15, 1934 Referring Provider (PT): Frankey Shown   Encounter Date: 03/13/2020   PT End of Session - 03/13/20 1224    Visit Number 15    Date for PT Re-Evaluation 03/21/20    Authorization Type MCR/MCD    Progress Note Due on Visit 20    PT Start Time 1145    PT Stop Time 1233    PT Time Calculation (min) 48 min    Activity Tolerance Patient tolerated treatment well           Past Medical History:  Diagnosis Date  . Arthritis   . COPD (chronic obstructive pulmonary disease) ([place])   . Dyspnea   . Hypertension   . Hypothyroidism   . Osteopenia   . Pneumonia   . Pulmonary embolism ([place]) 04/2018  . Thyroid disease     Past Surgical History:  Procedure Laterality Date  . APPENDECTOMY    . EYE SURGERY    . TONSILLECTOMY AND ADENOIDECTOMY    . TOTAL KNEE ARTHROPLASTY Right 01/01/2020   Procedure: RIGHT TOTAL KNEE ARTHROPLASTY;  Surgeon: Leandrew Koyanagi, MD;  Location: [facility];  Service: Orthopedics;  Laterality: Right;  . TUBAL LIGATION      There were no vitals filed for this visit.   Subjective Assessment - 03/13/20 1148    Subjective Pt states that her swelling has been better at night but by the middle of the day she has more swelling in the foot.    Pertinent History HTN, COPD, neck pain    Currently in Pain? No/denies              [facility] PT Assessment - 03/13/20 0001      Strength   Overall Strength Comments Rt hip abduction/flexion: 4/5 MMT     Right Knee Extension 5/5                         [facility] Adult PT Treatment/Exercise - 03/13/20 0001      Knee/Hip Exercises: Machines for Strengthening   Total Gym Leg Press Seat 7: BLE #60 x15 reps, #70 x10 reps       Knee/Hip Exercises:  Standing   Forward Step Up Right;3 sets;10 reps;Hand Hold: 1;Step Height: 6"      Knee/Hip Exercises: Supine   Bridges Limitations attempted, cramp in B hamstrings    Straight Leg Raises Strengthening;Right;2 sets;10 reps    Straight Leg Raises Limitations quad set prior to lift     Other Supine Knee/Hip Exercises single leg clam red TB 3x10 reps on Rt     Other Supine Knee/Hip Exercises hamstring curl isometric into red physioball 10x3 sec hold       Vasopneumatic   Number Minutes Vasopneumatic  10 minutes    Vasopnuematic Location  Knee;Ankle    Vasopneumatic Pressure Medium    Vasopneumatic Temperature  34 deg       Manual Therapy   Edema Management retrograde massage with Rt leg elevated                  PT Education - 03/13/20 1227    Education Details technique with therex    Person(s) Educated Patient    Methods Explanation;Verbal cues    Comprehension Verbalized  understanding;Returned demonstration            PT Short Term Goals - 02/26/20 1151      PT SHORT TERM GOAL #1   Title Ind with initial HEP for ROM and strength.    Time 2    Period Weeks    Status Achieved    Target Date 02/08/20      PT SHORT TERM GOAL #2   Title Patient able to safely amb 300 ft with SPC    Time 2    Period Weeks    Status Achieved      PT SHORT TERM GOAL #3   Title Patient able to climb stairs safely to her bedroom.    Time 4    Period Weeks    Status Achieved    Target Date 02/22/20      PT SHORT TERM GOAL #4   Title Patient to report 50% less discomfort in her lower leg and ankle due to reduced swelling.    Time 4    Period Weeks    Status Not Met   Pt reports no changes            PT Long Term Goals - 03/13/20 1201      PT LONG TERM GOAL #1   Title Patient to demo improved knee ROM to 0-120 or greater to normalize gait and ADLs.    Time 8    Period Weeks    Status Partially Met   Knee flexion was only 110 degrees today     PT LONG TERM GOAL #2    Title Patient able to climb stairs with a reciprocal gait pattern.    Time 8    Period Weeks    Status Achieved   Can do but not very consistently     PT LONG TERM GOAL #3   Title Patient able to safely walk community distances without AD.    Time 8    Period Weeks    Status Achieved      PT LONG TERM GOAL #4   Title Patient to demo 5/5 right knee strength and 4+/5 or greater bil hip strength to normalize ADLS    Baseline hip abduction and flexion still limited    Time 8    Period Weeks    Status On-going      PT LONG TERM GOAL #5   Title Improved FOTO score to <= 44% limitations    Time 8    Period Weeks    Status On-going                 Plan - 03/13/20 1226    Clinical Impression Statement Pt has had improvements in swelling during the night this past week. Session continued with focus on increasing Rt LE strength. Pt had muscle cramps during attempt at completing a bridge and PT had to monitor response to other exercises during the session. Pt was able to progress sets and resistance with several of her closed chain exercises. She still has weakness in hip abduction and flexion on the Rt 4/5 MMT. She would continue to benefit from skilled PT moving forward.    Personal Factors and Comorbidities Age;Comorbidity 2    Comorbidities HTN, COPD    Examination-Activity Limitations Bed Mobility;Stairs;Locomotion Level    Stability/Clinical Decision Making Stable/Uncomplicated    Rehab Potential Excellent    PT Frequency 2x / week    PT Duration 8 weeks    PT Treatment/Interventions  ADLs/Self Care Home Management;Cryotherapy;Electrical Stimulation;Neuromuscular re-education;Balance training;Therapeutic exercise;Therapeutic activities;Functional mobility training;Gait training;Stair training;Patient/family education;Manual techniques;Taping;Vasopneumatic Device;Joint Manipulations    PT Next Visit Plan progress hip and knee strength, single leg balance, hip flexion/abduction;  Game ready for ankle/foot & knee. Keep encouraging pt to wear her compression garments.    PT Home Exercise Plan [email or web address]    Consulted and Agree with Plan of Care Patient           Patient will benefit from skilled therapeutic intervention in order to improve the following deficits and impairments:  Abnormal gait, Decreased range of motion, Pain, Impaired flexibility, Decreased strength, Increased edema  Visit Diagnosis: Stiffness of right knee, not elsewhere classified  Localized edema  Other abnormalities of gait and mobility  Muscle weakness (generalized)  Acute pain of right knee     Problem List Patient Active Problem List   Diagnosis Date Noted  . Status post total right knee replacement 01/01/2020  . Primary osteoarthritis of right knee 11/14/2019  . Neck pain 12/06/2018  . Chronic pain of right knee 08/07/2016   12:55 PM,03/13/20 Sherol Dade PT, DPT [facility] [address], [place], [place], [address] Phone: [phone]   Fax:  [phone]  Name: Kathryn [name] MRN: [id number] Date of Birth: December 21, 1934

## 2020-03-19 ENCOUNTER — Other Ambulatory Visit: Payer: Self-pay

## 2020-03-19 ENCOUNTER — Ambulatory Visit: Payer: Medicare Other | Admitting: Physical Therapy

## 2020-03-19 ENCOUNTER — Encounter: Payer: Self-pay | Admitting: Physical Therapy

## 2020-03-19 DIAGNOSIS — R2689 Other abnormalities of gait and mobility: Secondary | ICD-10-CM

## 2020-03-19 DIAGNOSIS — M25561 Pain in right knee: Secondary | ICD-10-CM

## 2020-03-19 DIAGNOSIS — R600 Localized edema: Secondary | ICD-10-CM

## 2020-03-19 DIAGNOSIS — M6281 Muscle weakness (generalized): Secondary | ICD-10-CM

## 2020-03-19 DIAGNOSIS — M25661 Stiffness of right knee, not elsewhere classified: Secondary | ICD-10-CM

## 2020-03-19 NOTE — Therapy (Signed)
[facility] [address], [place], [place], [address] Phone: [phone]   Fax:  [phone]  Physical Therapy Treatment  Patient Details  Name: Kathryn [name] MRN: [id number] Date of Birth: August 26, 1935 Referring Provider (PT): Frankey Shown   Encounter Date: 03/19/2020   PT End of Session - 03/19/20 1228    Visit Number 16    Date for PT Re-Evaluation 03/21/20    Authorization Type MCR/MCD    Progress Note Due on Visit 20    PT Start Time 1147    PT Stop Time 1231    PT Time Calculation (min) 44 min    Activity Tolerance Patient tolerated treatment well    Behavior During Therapy [facility] for tasks assessed/performed           Past Medical History:  Diagnosis Date  . Arthritis   . COPD (chronic obstructive pulmonary disease) ([place])   . Dyspnea   . Hypertension   . Hypothyroidism   . Osteopenia   . Pneumonia   . Pulmonary embolism ([place]) 04/2018  . Thyroid disease     Past Surgical History:  Procedure Laterality Date  . APPENDECTOMY    . EYE SURGERY    . TONSILLECTOMY AND ADENOIDECTOMY    . TOTAL KNEE ARTHROPLASTY Right 01/01/2020   Procedure: RIGHT TOTAL KNEE ARTHROPLASTY;  Surgeon: Leandrew Koyanagi, MD;  Location: [place];  Service: Orthopedics;  Laterality: Right;  . TUBAL LIGATION      There were no vitals filed for this visit.   Subjective Assessment - 03/19/20 1149    Subjective Pt states that she was a little sore after her last session but it wasn't bad. No pain currently.    Pertinent History HTN, COPD, neck pain                             [facility] Adult PT Treatment/Exercise - 03/19/20 0001      Knee/Hip Exercises: Standing   Other Standing Knee Exercises Rt single leg stance on foam pad with Lt LE on 6" step 5x10 sec hold       Knee/Hip Exercises: Seated   Other Seated Knee/Hip Exercises Rt knee flexion yellow TB 2x10 reps       Knee/Hip Exercises: Supine   Straight Leg  Raises Strengthening;Right;2 sets;10 reps    Straight Leg Raises Limitations quad set prior to lift     Other Supine Knee/Hip Exercises Rt hip flexion isometric 10x3 sec hold      Vasopneumatic   Number Minutes Vasopneumatic  10 minutes    Vasopnuematic Location  Knee    Vasopneumatic Pressure Medium    Vasopneumatic Temperature  34 deg                   PT Education - 03/19/20 1239    Education Details importance of wearing compression garment; location in [place] that fits garments    Person(s) Educated Patient    Methods Explanation    Comprehension Verbalized understanding            PT Short Term Goals - 03/19/20 1200      PT SHORT TERM GOAL #1   Title Ind with initial HEP for ROM and strength.    Time 2    Period Weeks    Status Achieved    Target Date 02/08/20      PT SHORT TERM GOAL #2   Title  Patient able to safely amb 300 ft with SPC    Time 2    Period Weeks    Status Achieved      PT SHORT TERM GOAL #3   Title Patient able to climb stairs safely to her bedroom.    Time 4    Period Weeks    Status Achieved    Target Date 02/22/20      PT SHORT TERM GOAL #4   Title Patient to report 50% less discomfort in her lower leg and ankle due to reduced swelling.    Baseline 60% improved    Time 4    Period Weeks    Status Achieved   Pt reports no changes            PT Long Term Goals - 03/19/20 1201      PT LONG TERM GOAL #1   Title Patient to demo improved knee ROM to 0-120 or greater to normalize gait and ADLs.    Time 8    Period Weeks    Status Achieved   Knee flexion was only 110 degrees today     PT LONG TERM GOAL #2   Title Patient able to climb stairs with a reciprocal gait pattern.    Time 8    Period Weeks    Status Achieved   Can do but not very consistently     PT LONG TERM GOAL #3   Title Patient able to safely walk community distances without AD.    Time 8    Period Weeks    Status Achieved      PT LONG TERM GOAL #4     Title Patient to demo 5/5 right knee strength and 4+/5 or greater bil hip strength to normalize ADLS    Baseline hip abduction and flexion still limited    Time 8    Period Weeks    Status On-going      PT LONG TERM GOAL #5   Title Improved FOTO score to <= 44% limitations    Baseline 37% limitation    Time 8    Period Weeks    Status Achieved                 Plan - 03/19/20 1227    Clinical Impression Statement Pt is making steady progress towards her goals. Her FOTO score has improved to 37% limitation since starting PT. She feels her swelling and LE discomfort is atleast 60% improved. She is not wearing her compression garment consistently during the week. Session focused on therex to promote single leg stability and Rt hip flexion strength. Pt did not have Rt LE cramping during the session with intermittent rest breaks. Ended with vasopneumatic device for elevation and compression of the Rt knee end of session. Will plan for likely d/c at her next visit.    Personal Factors and Comorbidities Age;Comorbidity 2    Comorbidities HTN, COPD    Examination-Activity Limitations Bed Mobility;Stairs;Locomotion Level    Stability/Clinical Decision Making Stable/Uncomplicated    Rehab Potential Excellent    PT Frequency 2x / week    PT Duration 8 weeks    PT Treatment/Interventions ADLs/Self Care Home Management;Cryotherapy;Electrical Stimulation;Neuromuscular re-education;Balance training;Therapeutic exercise;Therapeutic activities;Functional mobility training;Gait training;Stair training;Patient/family education;Manual techniques;Taping;Vasopneumatic Device;Joint Manipulations    PT Next Visit Plan plan for D/C; progress hip and knee strength, single leg balance, hip flexion/abduction; Game ready for ankle/foot & knee. Keep encouraging pt to wear her compression garments.  PT Home Exercise Plan [email or web address]    Consulted and Agree with Plan of Care Patient           Patient will  benefit from skilled therapeutic intervention in order to improve the following deficits and impairments:  Abnormal gait, Decreased range of motion, Pain, Impaired flexibility, Decreased strength, Increased edema  Visit Diagnosis: Stiffness of right knee, not elsewhere classified  Localized edema  Other abnormalities of gait and mobility  Muscle weakness (generalized)  Acute pain of right knee     Problem List Patient Active Problem List   Diagnosis Date Noted  . Status post total right knee replacement 01/01/2020  . Primary osteoarthritis of right knee 11/14/2019  . Neck pain 12/06/2018  . Chronic pain of right knee 08/07/2016    12:39 PM,03/19/20 Sherol Dade PT, DPT [place] [facility] [address] [place], [place], [address] Phone: [phone]   Fax:  [phone]  Name: Dorma Altman MRN: [id number] Date of Birth: Sep 09, 1935

## 2020-03-21 ENCOUNTER — Ambulatory Visit: Payer: Medicare Other | Attending: Orthopaedic Surgery | Admitting: Physical Therapy

## 2020-03-21 ENCOUNTER — Encounter: Payer: Self-pay | Admitting: Physical Therapy

## 2020-03-21 ENCOUNTER — Other Ambulatory Visit: Payer: Self-pay

## 2020-03-21 DIAGNOSIS — R2689 Other abnormalities of gait and mobility: Secondary | ICD-10-CM | POA: Diagnosis not present

## 2020-03-21 DIAGNOSIS — M25561 Pain in right knee: Secondary | ICD-10-CM | POA: Diagnosis not present

## 2020-03-21 DIAGNOSIS — M6281 Muscle weakness (generalized): Secondary | ICD-10-CM | POA: Insufficient documentation

## 2020-03-21 DIAGNOSIS — M25661 Stiffness of right knee, not elsewhere classified: Secondary | ICD-10-CM | POA: Diagnosis not present

## 2020-03-21 DIAGNOSIS — R600 Localized edema: Secondary | ICD-10-CM | POA: Diagnosis not present

## 2020-03-21 NOTE — Therapy (Signed)
[facility] [address] [place], [place], [address] Phone: [phone]   Fax:  [phone]  Physical Therapy Treatment/Discharge  Patient Details  Name: Kathryn [name] MRN: [id number] Date of Birth: 1934-10-05 Referring Provider (PT): Frankey Shown   Encounter Date: 03/21/2020   PT End of Session - 03/21/20 1248    Visit Number 17    Date for PT Re-Evaluation 03/21/20    Authorization Type MCR/MCD    Progress Note Due on Visit 20    PT Start Time 1146    PT Stop Time 1225    PT Time Calculation (min) 39 min    Activity Tolerance Patient tolerated treatment well;No increased pain    Behavior During Therapy [facility] for tasks assessed/performed           Past Medical History:  Diagnosis Date  . Arthritis   . COPD (chronic obstructive pulmonary disease) ([place])   . Dyspnea   . Hypertension   . Hypothyroidism   . Osteopenia   . Pneumonia   . Pulmonary embolism ([place]) 04/2018  . Thyroid disease     Past Surgical History:  Procedure Laterality Date  . APPENDECTOMY    . EYE SURGERY    . TONSILLECTOMY AND ADENOIDECTOMY    . TOTAL KNEE ARTHROPLASTY Right 01/01/2020   Procedure: RIGHT TOTAL KNEE ARTHROPLASTY;  Surgeon: Leandrew Koyanagi, MD;  Location: [place];  Service: Orthopedics;  Laterality: Right;  . TUBAL LIGATION      There were no vitals filed for this visit.   Subjective Assessment - 03/21/20 1148    Subjective Pt states that things are going well. She still has some swelling and numbness in the knee/top of the foot.    Pertinent History HTN, COPD, neck pain    Currently in Pain? No/denies              [facility] PT Assessment - 03/21/20 0001      Assessment   Medical Diagnosis primary OA Rt knee; s/p Rt TKA    Referring Provider (PT) Naiping Xu    Onset Date/Surgical Date 01/01/20      Balance Screen   Has the patient fallen in the past 6 months No      Circumferential Edema   Circumferential -  Right 43   Knee     PROM   Right Knee Extension 0    Right Knee Flexion 130      Strength   Overall Strength Comments Rt hip abduction/flexion: 4/5 MMT     Right Knee Flexion 4+/5    Right Knee Extension 5/5                         [facility] Adult PT Treatment/Exercise - 03/21/20 0001      Knee/Hip Exercises: Standing   Hip Abduction Stengthening;Right;Left;2 sets;10 reps    SLS Rt 3x10 sec BUE support; tandem stance each LE forward 10sec each      Knee/Hip Exercises: Seated   Other Seated Knee/Hip Exercises Rt hip flexion 10x5 sec hold       Knee/Hip Exercises: Supine   Straight Leg Raises Strengthening;Right;2 sets;10 reps    Straight Leg Raises Limitations quad set prior to lift                   PT Education - 03/21/20 1247    Education Details updated HEP    Person(s) Educated Patient  Methods Explanation;Handout    Comprehension Verbalized understanding            PT Short Term Goals - 03/21/20 1255      PT SHORT TERM GOAL #1   Title Ind with initial HEP for ROM and strength.    Time 2    Period Weeks    Status Achieved    Target Date 02/08/20      PT SHORT TERM GOAL #2   Title Patient able to safely amb 300 ft with SPC    Time 2    Period Weeks    Status Achieved      PT SHORT TERM GOAL #3   Title Patient able to climb stairs safely to her bedroom.    Time 4    Period Weeks    Status Achieved    Target Date 02/22/20      PT SHORT TERM GOAL #4   Title Patient to report 50% less discomfort in her lower leg and ankle due to reduced swelling.    Baseline 60% improved    Time 4    Period Weeks    Status Achieved   Pt reports no changes            PT Long Term Goals - 03/21/20 1255      PT LONG TERM GOAL #1   Title Patient to demo improved knee ROM to 0-120 or greater to normalize gait and ADLs.    Time 8    Period Weeks    Status Achieved   Knee flexion was only 110 degrees today     PT LONG TERM GOAL #2   Title  Patient able to climb stairs with a reciprocal gait pattern.    Time 8    Period Weeks    Status Achieved   Can do but not very consistently     PT LONG TERM GOAL #3   Title Patient able to safely walk community distances without AD.    Time 8    Period Weeks    Status Achieved      PT LONG TERM GOAL #4   Title Patient to demo 5/5 right knee strength and 4+/5 or greater bil hip strength to normalize ADLS    Baseline hip abduction and flexion still limited    Time 8    Period Weeks    Status Partially Met      PT LONG TERM GOAL #5   Title Improved FOTO score to <= 44% limitations    Baseline 37% limitation    Time 8    Period Weeks    Status Achieved                 Plan - 03/21/20 1248    Clinical Impression Statement Pt is pleased with her progress and is agreeable with discharge today. She has met all her short- and long-term goals. Rt knee ROM is full and pain free. She has strength deficits bilaterally but is comfortable working on this on her own with an updated HEP. Pt reports being able to safely get around her home/community without limitation. Pt continue to present with swelling in the Rt ankle/foot but it is unclear how consistent she is wearing her compression garment. She will follow up with her referring physician regarding this in the upcoming week.    Personal Factors and Comorbidities Age;Comorbidity 2    Comorbidities HTN, COPD    Examination-Activity Limitations Bed Mobility;Stairs;Locomotion Level  Stability/Clinical Decision Making Stable/Uncomplicated    Rehab Potential Excellent    PT Frequency 2x / week    PT Duration 8 weeks    PT Treatment/Interventions ADLs/Self Care Home Management;Cryotherapy;Electrical Stimulation;Neuromuscular re-education;Balance training;Therapeutic exercise;Therapeutic activities;Functional mobility training;Gait training;Stair training;Patient/family education;Manual techniques;Taping;Vasopneumatic Device;Joint  Manipulations    PT Next Visit Plan plan for D/C; progress hip and knee strength, single leg balance, hip flexion/abduction; Game ready for ankle/foot & knee. Keep encouraging pt to wear her compression garments.    PT Home Exercise Plan [email or web address]    Consulted and Agree with Plan of Care Patient           Patient will benefit from skilled therapeutic intervention in order to improve the following deficits and impairments:  Abnormal gait, Decreased range of motion, Pain, Impaired flexibility, Decreased strength, Increased edema  Visit Diagnosis: Stiffness of right knee, not elsewhere classified  Localized edema  Other abnormalities of gait and mobility  Muscle weakness (generalized)  Acute pain of right knee     Problem List Patient Active Problem List   Diagnosis Date Noted  . Status post total right knee replacement 01/01/2020  . Primary osteoarthritis of right knee 11/14/2019  . Neck pain 12/06/2018  . Chronic pain of right knee 08/07/2016   PHYSICAL THERAPY DISCHARGE SUMMARY  Visits from Start of Care: 17  Current functional level related to goals / functional outcomes: See above for more details    Remaining deficits: See above for more details    Education / Equipment: See above for more details   Plan: Patient agrees to discharge.  Patient goals were met. Patient is being discharged due to being pleased with the current functional level.  ?????          12:58 PM,03/21/20 [place], [place] [facility] [place]  [facility] [address], [place], [place], [address] Phone: [phone]   Fax:  [phone]  Name: Kathryn [name] MRN: [id number] Date of Birth: [birth year]

## 2020-03-21 NOTE — Patient Instructions (Signed)
Access Code: [email or web address] URL: [email or web address] Date: 03/21/2020 Prepared by: [place]  Exercises Supine Active Straight Leg Raise - 1 x daily - 7 x weekly - 3 sets - 10 reps Seated March - 1 x daily - 3 x weekly - 2 sets - 10 reps - 0-5 secondsonds hold Standing Hip Abduction with Resistance at Ankles and Counter Support - 1 x daily - 3 x weekly - 2 sets - 10 reps Standing Single Leg Stance with Counter Support - 1 x daily - 3 x weekly - 5 reps - 10 seconds hold  [facility] [address], [place], [place] [address] Phone # [phone] Fax [phone]

## 2020-03-26 ENCOUNTER — Ambulatory Visit (INDEPENDENT_AMBULATORY_CARE_PROVIDER_SITE_OTHER): Payer: Medicare Other | Admitting: Orthopaedic Surgery

## 2020-03-26 ENCOUNTER — Encounter: Payer: Self-pay | Admitting: Orthopaedic Surgery

## 2020-03-26 VITALS — Ht 64.5 in | Wt 170.0 lb

## 2020-03-26 DIAGNOSIS — Z96651 Presence of right artificial knee joint: Secondary | ICD-10-CM

## 2020-03-26 NOTE — Progress Notes (Signed)
Patient ID: Kathryn [name], female   DOB: 01-Apr-1935, 85 y.o.   MRN: [id number]  Kathryn [name] is 49-month status post right total knee replacement.  She finished outpatient PT last week.  She reports no pain.  Surgical scar is fully healed.  Her swelling is improved.  She has numbness lateral to the incision.  Range of motion is 0 to 100 degrees.  At this point she can continue to increase her as tolerated.  She will continue with home strengthening exercises.  Recheck in 3 months with two-view x-rays of the right knee.  Dental prophylaxis reinforced today.

## 2020-04-05 ENCOUNTER — Telehealth: Payer: Self-pay | Admitting: *Deleted

## 2020-04-05 NOTE — Telephone Encounter (Signed)
Ortho bundle 90 day call completed. See flow sheet for details.

## 2020-04-13 IMAGING — DX DG CHEST 2V
2 series · 2 of 2 positions shown · non-contrast
Comparison: PA and lateral chest 03/27/2009 and 04/10/2010.

CLINICAL DATA: Left chest pain beginning yesterday. History of
COPD.

EXAM:
CHEST - 2 VIEW

[chest pa]
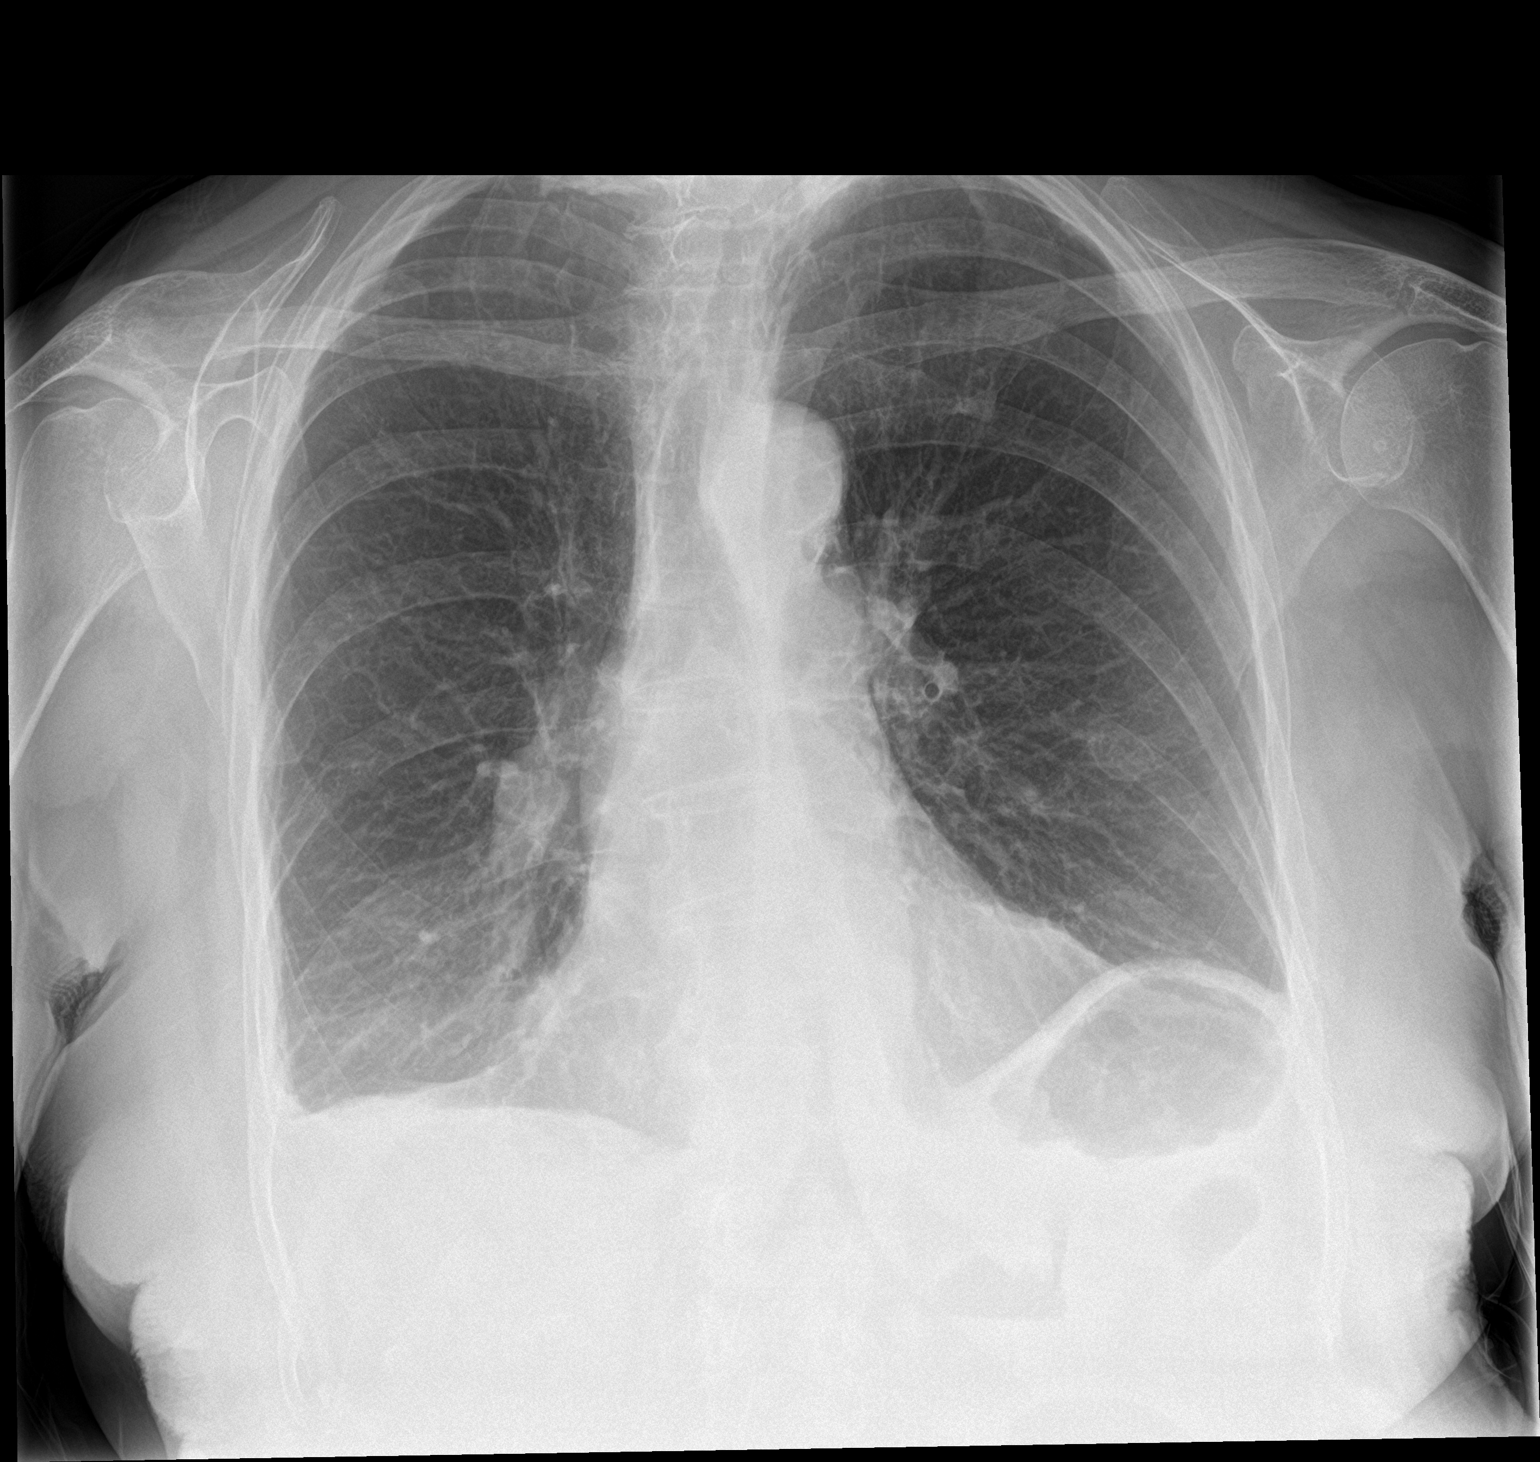

[chest lat]
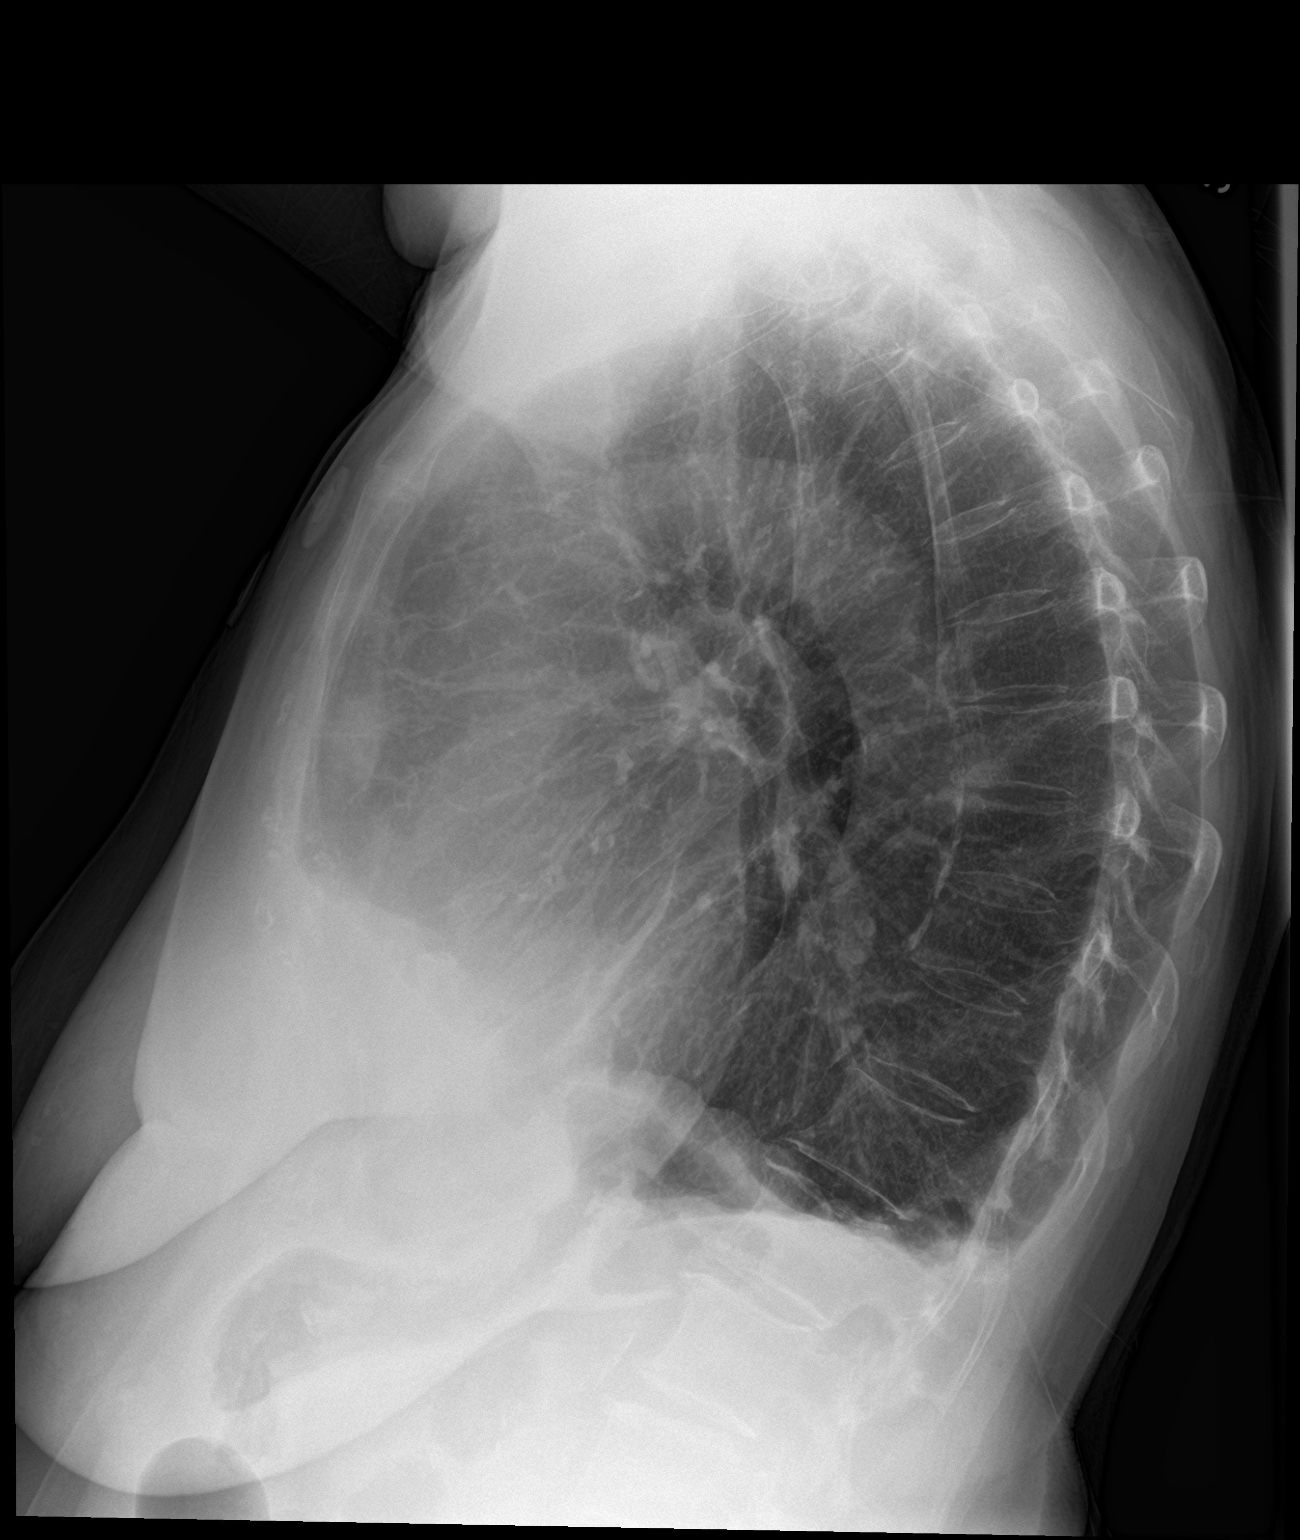

[2 of 2 positions shown; findings below may reference images not displayed]

FINDINGS: The chest is hyperexpanded with attenuation of the pulmonary
vasculature. Trace bilateral pleural effusions are seen. Lungs are
clear. Heart size is upper normal. Aortic atherosclerosis is noted.
No acute or focal abnormality.
IMPRESSION: A trace bilateral pleural effusions.

Emphysema.

Atherosclerosis.

## 2020-04-14 IMAGING — DX DG CHEST 2V
2 series · 2 of 2 positions shown · non-contrast
Comparison: Chest x-ray and CTA chest 05/17/2018.

CLINICAL DATA: Persistent chest pain.  Known pulmonary embolus.

EXAM:
CHEST - 2 VIEW

[chest pa]
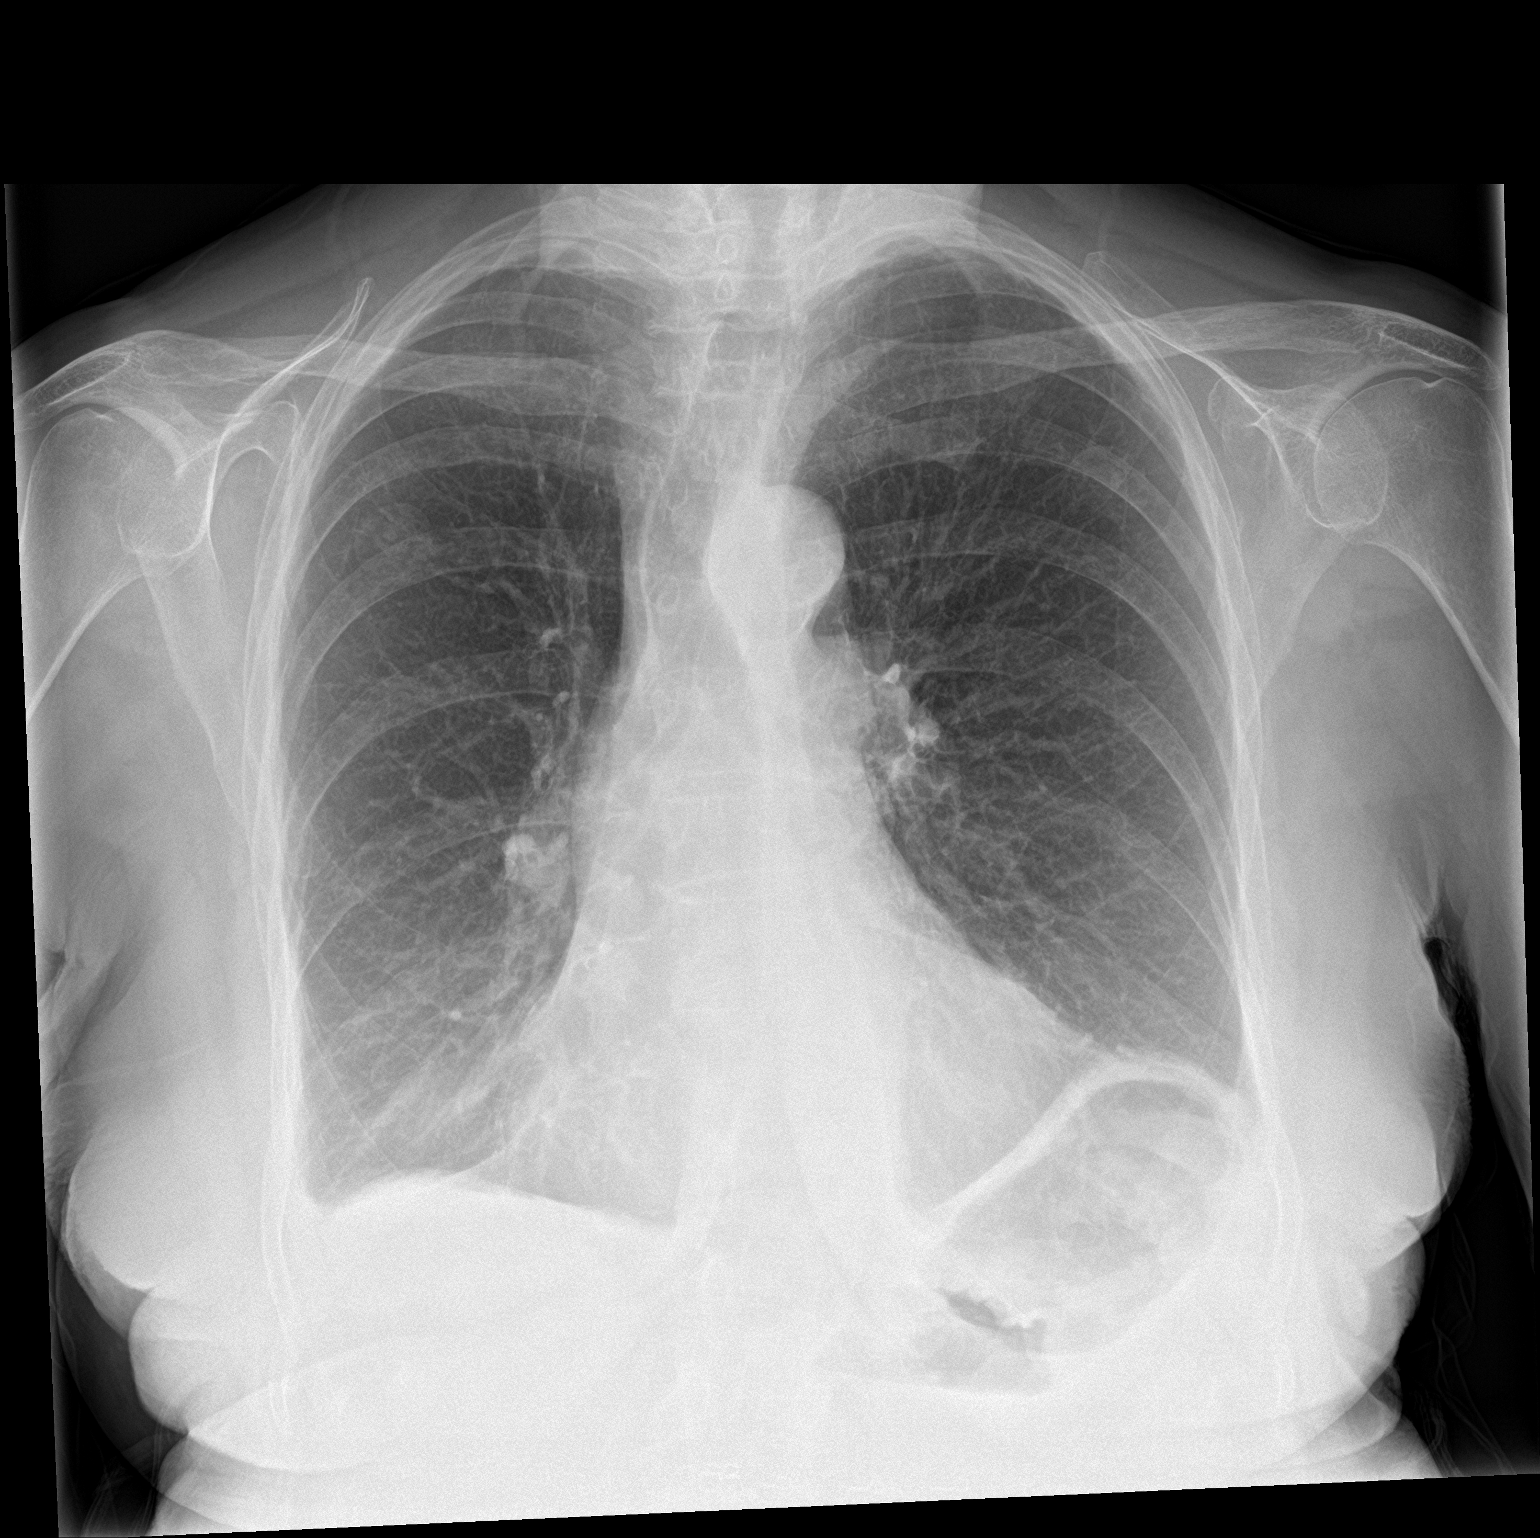

[chest lat]
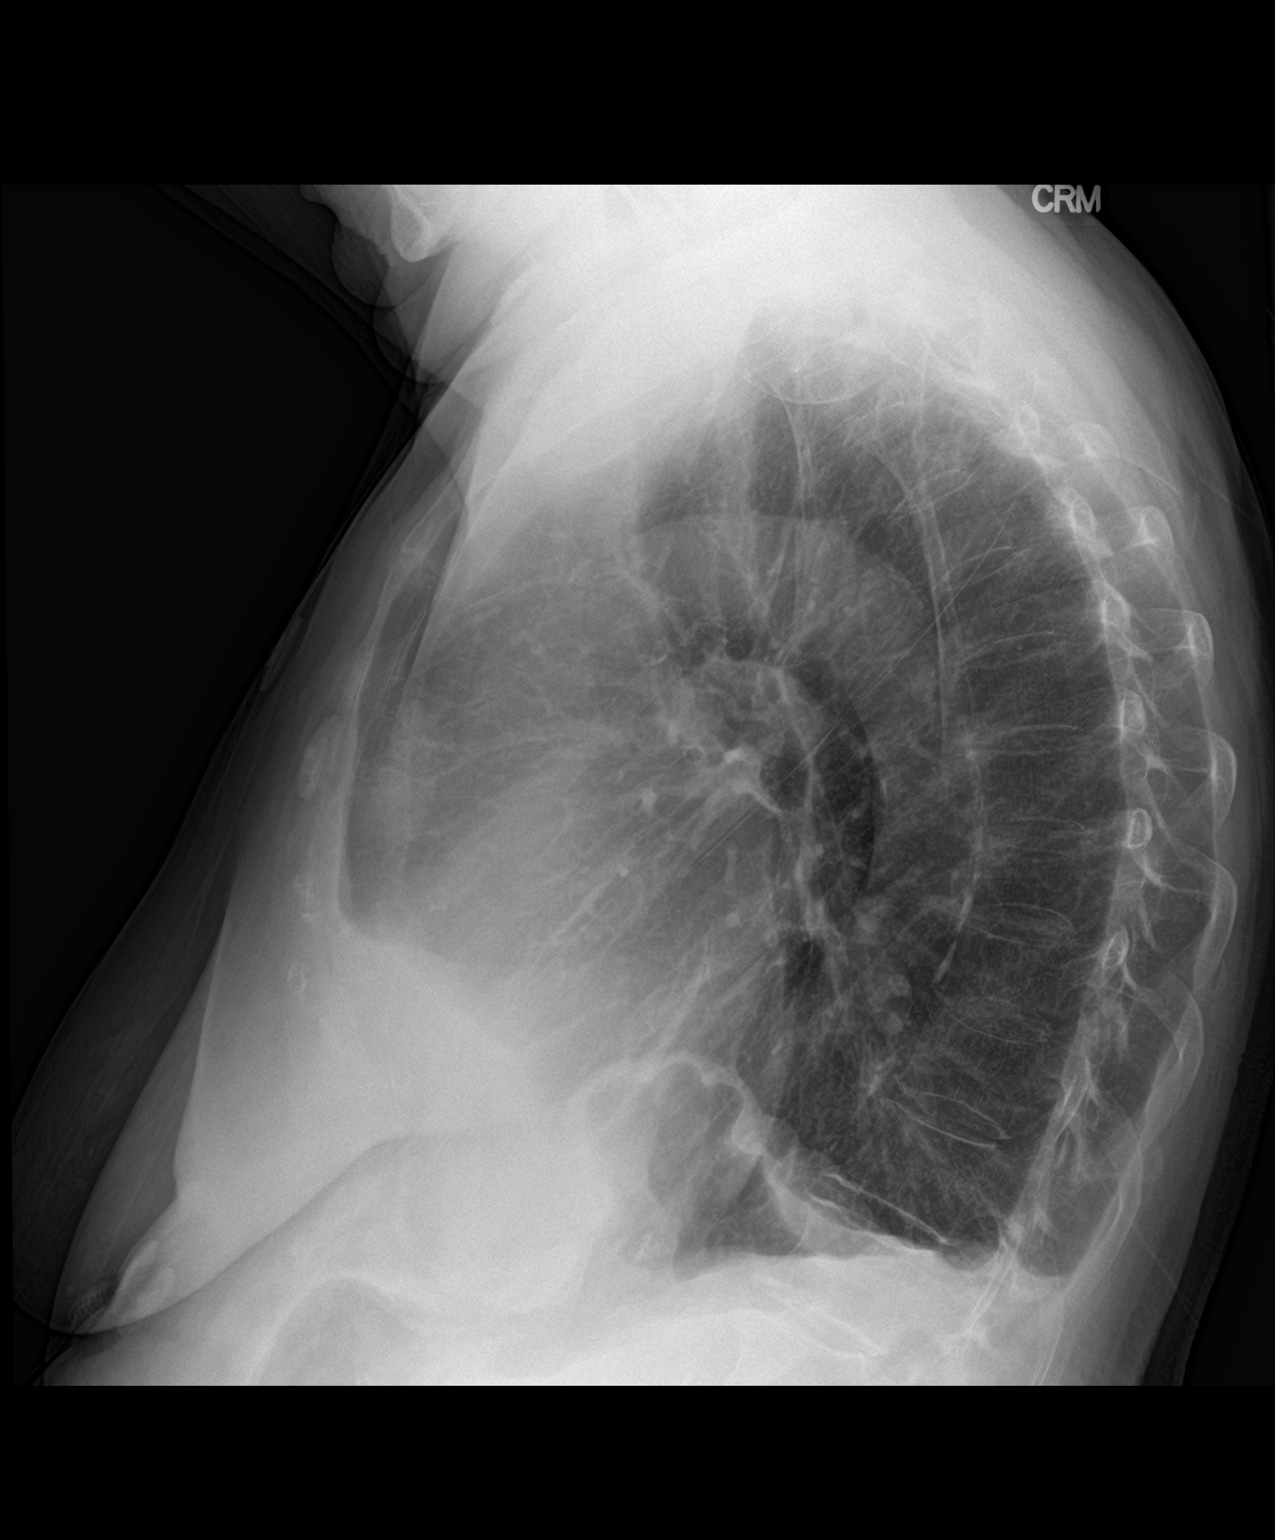

[2 of 2 positions shown; findings below may reference images not displayed]

FINDINGS: The heart size is normal. Aortic atherosclerosis is again seen.
Lungs are hyperexpanded. Small effusions are stable. Minimal
bibasilar atelectasis is present. No new airspace disease or
collapse is present. The visualized soft tissues and bony thorax are
unremarkable.
IMPRESSION: 1. No significant interval change.
2. Stable small pleural effusions and associated atelectasis.
3. Changes of COPD.
4. Aortic atherosclerosis.

## 2020-06-13 DIAGNOSIS — Z85828 Personal history of other malignant neoplasm of skin: Secondary | ICD-10-CM | POA: Diagnosis not present

## 2020-06-13 DIAGNOSIS — L820 Inflamed seborrheic keratosis: Secondary | ICD-10-CM | POA: Diagnosis not present

## 2020-06-13 DIAGNOSIS — C44311 Basal cell carcinoma of skin of nose: Secondary | ICD-10-CM | POA: Diagnosis not present

## 2020-06-26 ENCOUNTER — Encounter: Payer: Self-pay | Admitting: Orthopaedic Surgery

## 2020-06-26 ENCOUNTER — Ambulatory Visit (INDEPENDENT_AMBULATORY_CARE_PROVIDER_SITE_OTHER): Payer: Medicare Other

## 2020-06-26 ENCOUNTER — Ambulatory Visit (INDEPENDENT_AMBULATORY_CARE_PROVIDER_SITE_OTHER): Payer: Medicare Other | Admitting: Orthopaedic Surgery

## 2020-06-26 DIAGNOSIS — Z96651 Presence of right artificial knee joint: Secondary | ICD-10-CM | POA: Diagnosis not present

## 2020-06-26 NOTE — Progress Notes (Signed)
     Patient: Kathryn [name]           Date of Birth: 1934/12/01           MRN: [id number] Visit Date: 06/26/2020 PCP: Mayra Neer, MD   Assessment & Plan:  Chief Complaint:  Chief Complaint  Patient presents with  . Right Knee - Pain   Visit Diagnoses:  1. Status post total right knee replacement     Plan: Kathryn [name] is a 51-month status post right total knee replacement.  Overall doing well.  Feels mechanical to her.  No real complaints.  Surgical scar fully healed.  Range of motion is excellent.  She has bilateral lower extremity pitting edema.  Overall I think she is doing well and she is happy.  Her main complaint is the fact that she still has the pitting edema.  No one has been able to identify a cause for this other than that it is age-related.  I have recommended compression socks.  Recheck in 6 months with two-view x-rays of the right knee.  Dental prophylaxis was reinforced today.  Follow-Up Instructions: Return in about 6 months (around 12/25/2020).   Orders:  Orders Placed This Encounter  Procedures  . XR KNEE 3 VIEW RIGHT   No orders of the defined types were placed in this encounter.   Imaging: No results found.  PMFS History: Patient Active Problem List   Diagnosis Date Noted  . Status post total right knee replacement 01/01/2020  . Primary osteoarthritis of right knee 11/14/2019  . Neck pain 12/06/2018  . Chronic pain of right knee 08/07/2016   Past Medical History:  Diagnosis Date  . Arthritis   . COPD (chronic obstructive pulmonary disease) ([place])   . Dyspnea   . Hypertension   . Hypothyroidism   . Osteopenia   . Pneumonia   . Pulmonary embolism ([place]) 04/2018  . Thyroid disease     Family History  Problem Relation Age of Onset  . Breast cancer Mother   . Breast cancer Daughter 56    Past Surgical History:  Procedure Laterality Date  . APPENDECTOMY    . EYE SURGERY    . TONSILLECTOMY AND ADENOIDECTOMY    . TOTAL KNEE ARTHROPLASTY  Right 01/01/2020   Procedure: RIGHT TOTAL KNEE ARTHROPLASTY;  Surgeon: Leandrew Koyanagi, MD;  Location: [place];  Service: Orthopedics;  Laterality: Right;  . TUBAL LIGATION     Social History   Occupational History  . Not on file  Tobacco Use  . Smoking status: Former Smoker    Quit date: 2018    Years since quitting: 3.7  . Smokeless tobacco: Never Used  Substance and Sexual Activity  . Alcohol use: Never  . Drug use: Never  . Sexual activity: Not on file

## 2020-07-10 DIAGNOSIS — Z85828 Personal history of other malignant neoplasm of skin: Secondary | ICD-10-CM | POA: Diagnosis not present

## 2020-07-10 DIAGNOSIS — C44311 Basal cell carcinoma of skin of nose: Secondary | ICD-10-CM | POA: Diagnosis not present

## 2020-07-30 ENCOUNTER — Other Ambulatory Visit: Payer: Self-pay | Admitting: Family Medicine

## 2020-07-30 DIAGNOSIS — Z0000 Encounter for general adult medical examination without abnormal findings: Secondary | ICD-10-CM

## 2020-08-21 DIAGNOSIS — Z23 Encounter for immunization: Secondary | ICD-10-CM | POA: Diagnosis not present

## 2020-08-23 ENCOUNTER — Ambulatory Visit: Payer: Medicare Other | Attending: Internal Medicine

## 2020-08-23 DIAGNOSIS — Z23 Encounter for immunization: Secondary | ICD-10-CM

## 2020-08-23 NOTE — Progress Notes (Signed)
   Covid-19 Vaccination Clinic  Name:  Arlynn Stare    MRN: [id number] DOB: 1934/10/12  08/23/2020  Ms. Segovia was observed post Covid-19 immunization for 15 minutes without incident. She was provided with Vaccine Information Sheet and instruction to access the V-Safe system.   Ms. Pasqual was instructed to call 911 with any severe reactions post vaccine: Marland Kitchen Difficulty breathing  . Swelling of face and throat  . A fast heartbeat  . A bad rash all over body  . Dizziness and weakness   Immunizations Administered    Name Date Dose VIS Date Route   Pfizer COVID-19 Vaccine 08/23/2020  3:34 PM 0.3 mL 07/10/2020 Intramuscular   Manufacturer: [place]   Lot: [id number]   NDC: [address]-3

## 2020-09-10 ENCOUNTER — Ambulatory Visit: Payer: Medicare Other

## 2020-09-18 DIAGNOSIS — L0201 Cutaneous abscess of face: Secondary | ICD-10-CM | POA: Diagnosis not present

## 2020-09-18 DIAGNOSIS — Z85828 Personal history of other malignant neoplasm of skin: Secondary | ICD-10-CM | POA: Diagnosis not present

## 2020-10-08 ENCOUNTER — Ambulatory Visit: Payer: Medicare Other

## 2020-11-19 ENCOUNTER — Ambulatory Visit
Admission: RE | Admit: 2020-11-19 | Discharge: 2020-11-19 | Disposition: A | Discharge status: Home / Self Care | Payer: Medicare Other | Source: Ambulatory Visit | Attending: Family Medicine | Admitting: Family Medicine

## 2020-11-19 ENCOUNTER — Other Ambulatory Visit: Payer: Self-pay

## 2020-11-19 DIAGNOSIS — Z0000 Encounter for general adult medical examination without abnormal findings: Secondary | ICD-10-CM

## 2020-11-19 DIAGNOSIS — Z1231 Encounter for screening mammogram for malignant neoplasm of breast: Secondary | ICD-10-CM | POA: Diagnosis not present

## 2020-12-25 ENCOUNTER — Telehealth: Payer: Self-pay | Admitting: *Deleted

## 2020-12-25 ENCOUNTER — Encounter: Payer: Self-pay | Admitting: Orthopaedic Surgery

## 2020-12-25 ENCOUNTER — Other Ambulatory Visit: Payer: Self-pay

## 2020-12-25 ENCOUNTER — Ambulatory Visit (INDEPENDENT_AMBULATORY_CARE_PROVIDER_SITE_OTHER): Payer: Medicare Other

## 2020-12-25 ENCOUNTER — Ambulatory Visit (INDEPENDENT_AMBULATORY_CARE_PROVIDER_SITE_OTHER): Payer: Medicare Other | Admitting: Orthopaedic Surgery

## 2020-12-25 DIAGNOSIS — Z96651 Presence of right artificial knee joint: Secondary | ICD-10-CM

## 2020-12-25 MED ORDER — AMOXICILLIN 500 MG PO CAPS: ORAL_CAPSULE | ORAL | 0 refills | Status: DC

## 2020-12-25 NOTE — Telephone Encounter (Signed)
Ortho bundle 1 year post op in office meeting completed.

## 2020-12-25 NOTE — Progress Notes (Signed)
   Post-Op Visit Note   Patient: Julaine Fusi           Date of Birth: May 08, 1935           MRN: [id number] Visit Date: 12/25/2020 PCP: Orma Render, NP   Assessment & Plan:  Chief Complaint:  Chief Complaint  Patient presents with  . Right Knee - Pain   Visit Diagnoses:  1. Status post total right knee replacement     Plan: Patient is a very pleasant 86 year old female who comes in today 1 year out right total knee replacement.  She has been doing well.  She continues to have decreased sensation to the lateral knee.  This is not painful.  She is also complaining of bilateral leg swelling, achiness, numbness and tingling which is actually been ongoing since before surgery.  She is scheduled to see her primary care doctor next Thursday to address this issue.  Examination of her right knee reveals a fully healed surgical scar without complication.  Range of motion 0 to 125 degrees.  She is stable valgus varus stress.  She is neurovascularly intact distally.  At this point, she will continue to advance with activity as tolerated.  Follow-up with [place] in 1 years time for repeat evaluation and 2 view x-rays of the right knee.  Dental prophylaxis reinforced.  Follow-Up Instructions: Return in about 1 year (around 12/25/2021).   Orders:  Orders Placed This Encounter  Procedures  . XR Knee 1-2 Views Right   No orders of the defined types were placed in this encounter.   Imaging: XR Knee 1-2 Views Right  Result Date: 12/25/2020 Needed prosthesis without complication   PMFS History: Patient Active Problem List   Diagnosis Date Noted  . Status post total right knee replacement 01/01/2020  . Primary osteoarthritis of right knee 11/14/2019  . Neck pain 12/06/2018  . Chronic pain of right knee 08/07/2016   Past Medical History:  Diagnosis Date  . Arthritis   . COPD (chronic obstructive pulmonary disease) ([place])   . Dyspnea   . Hypertension   . Hypothyroidism   . Osteopenia    . Pneumonia   . Pulmonary embolism ([place]) 04/2018  . Thyroid disease     Family History  Problem Relation Age of Onset  . Breast cancer Mother   . Breast cancer Daughter 65    Past Surgical History:  Procedure Laterality Date  . APPENDECTOMY    . EYE SURGERY    . TONSILLECTOMY AND ADENOIDECTOMY    . TOTAL KNEE ARTHROPLASTY Right 01/01/2020   Procedure: RIGHT TOTAL KNEE ARTHROPLASTY;  Surgeon: Leandrew Koyanagi, MD;  Location: [place];  Service: Orthopedics;  Laterality: Right;  . TUBAL LIGATION     Social History   Occupational History  . Not on file  Tobacco Use  . Smoking status: Former Smoker    Quit date: 2018    Years since quitting: 4.2  . Smokeless tobacco: Never Used  Substance and Sexual Activity  . Alcohol use: Never  . Drug use: Never  . Sexual activity: Not on file

## 2021-01-02 ENCOUNTER — Other Ambulatory Visit (HOSPITAL_BASED_OUTPATIENT_CLINIC_OR_DEPARTMENT_OTHER): Payer: Self-pay

## 2021-01-02 ENCOUNTER — Other Ambulatory Visit: Payer: Self-pay

## 2021-01-02 ENCOUNTER — Ambulatory Visit (INDEPENDENT_AMBULATORY_CARE_PROVIDER_SITE_OTHER): Payer: Medicare Other | Admitting: Nurse Practitioner

## 2021-01-02 VITALS — BP 161/71 | HR 74 | Ht 65.0 in | Wt 167.8 lb

## 2021-01-02 DIAGNOSIS — M8580 Other specified disorders of bone density and structure, unspecified site: Secondary | ICD-10-CM | POA: Diagnosis not present

## 2021-01-02 DIAGNOSIS — M170 Bilateral primary osteoarthritis of knee: Secondary | ICD-10-CM

## 2021-01-02 DIAGNOSIS — Z7689 Persons encountering health services in other specified circumstances: Secondary | ICD-10-CM

## 2021-01-02 DIAGNOSIS — J3089 Other allergic rhinitis: Secondary | ICD-10-CM | POA: Diagnosis not present

## 2021-01-02 DIAGNOSIS — Z13820 Encounter for screening for osteoporosis: Secondary | ICD-10-CM

## 2021-01-02 DIAGNOSIS — E039 Hypothyroidism, unspecified: Secondary | ICD-10-CM | POA: Diagnosis not present

## 2021-01-02 DIAGNOSIS — R202 Paresthesia of skin: Secondary | ICD-10-CM

## 2021-01-02 DIAGNOSIS — I10 Essential (primary) hypertension: Secondary | ICD-10-CM | POA: Diagnosis not present

## 2021-01-02 DIAGNOSIS — I872 Venous insufficiency (chronic) (peripheral): Secondary | ICD-10-CM

## 2021-01-02 DIAGNOSIS — J449 Chronic obstructive pulmonary disease, unspecified: Secondary | ICD-10-CM | POA: Diagnosis not present

## 2021-01-02 DIAGNOSIS — I2699 Other pulmonary embolism without acute cor pulmonale: Secondary | ICD-10-CM | POA: Diagnosis not present

## 2021-01-02 DIAGNOSIS — M4692 Unspecified inflammatory spondylopathy, cervical region: Secondary | ICD-10-CM

## 2021-01-02 DIAGNOSIS — J310 Chronic rhinitis: Secondary | ICD-10-CM | POA: Insufficient documentation

## 2021-01-02 DIAGNOSIS — E079 Disorder of thyroid, unspecified: Secondary | ICD-10-CM | POA: Insufficient documentation

## 2021-01-02 DIAGNOSIS — G4700 Insomnia, unspecified: Secondary | ICD-10-CM | POA: Diagnosis not present

## 2021-01-02 DIAGNOSIS — Z86711 Personal history of pulmonary embolism: Secondary | ICD-10-CM | POA: Insufficient documentation

## 2021-01-02 HISTORY — DX: Encounter for screening for osteoporosis: Z13.820

## 2021-01-02 HISTORY — DX: Persons encountering health services in other specified circumstances: Z76.89

## 2021-01-02 MED ORDER — GABAPENTIN 100 MG PO CAPS
ORAL_CAPSULE | ORAL | 3 refills | Status: DC
  Filled 2021-01-02: qty 90, 10d supply, fill #0
  Filled 2021-08-01: qty 90, 10d supply, fill #1

## 2021-01-02 NOTE — Patient Instructions (Addendum)
Recommendations from today's visit:   Please schedule your Annual Medicare Wellness Exam with labs in the next few weeks.   Please do not eat or drink anything (water and black coffee OK) prior to having your labs drawn.   This is done on the Ground Floor of this building in the [place] located off of the elevator.   Please take all of your usual medications that day   I have sent in the prescription for gabapentin (also called Neurontin) for you to the pharmacy. You can take 100-$RemoveBefo'300mg'[id number]$  every 8 hours as needed for foot pain.   I am going to work on ordering the leg pumps for you and hopefully we will get approval for that! I will let you know as soon as I get some information about that.   __________________________________________________________________ Thank you for choosing [facility] for your Primary Care needs. I am excited for the opportunity to partner with you to meet your health care goals. It was a pleasure meeting you today!  I am an Adult-Geriatric Nurse Practitioner with a background in caring for patients for more than 20 years. I received my Paediatric nurse in Nursing and my Doctor of Nursing Practice degrees at [employer]. I received additional fellowship training in primary care and sports medicine after receiving my doctorate degree. I provide primary care and sports medicine services to patients age 52 and older within this office. I am also a provider with the [facility] and the director of the APP Fellowship with [facility].  I am a [place] native, but have called the [place] area home for nearly 20 years and am proud to be a member of this community.   I am passionate about providing the best service to you through preventive medicine and supportive care. I consider you a part of the medical team and value your input. I work diligently to ensure that you are heard and your  needs are met in a safe and effective manner. I want you to feel comfortable with me as your provider and want you to know that your health concerns are important to me.   For your information, our office hours are Monday- Friday 8:00 AM - 5:00 PM At this time I am not in the office on Wednesdays.  If you have questions or concerns, please call our office at [phone] or send [place] a MyChart message and we will respond as quickly as possible.   For all urgent or time sensitive needs we ask that you please call the office to avoid delays. MyChart is not constantly monitored and replies may take up to 72 business hours.  MyChart Policy: . MyChart allows for you to see your visit notes, after visit summary, provider recommendations, lab and tests results, make an appointment, request refills, and contact your provider or the office for non-urgent questions or concerns.  . Providers are seeing patients during normal business hours and do not have built in time to review MyChart messages. We ask that you allow a minimum of 72 business hours for MyChart message responses.  . Complex MyChart concerns may require a visit. Your provider may request you schedule a virtual or in person visit to ensure we are providing the best care possible. . MyChart messages sent after 4:00 PM on Friday will not be received by the provider until Monday morning.    Lab and Test Results: . You will receive your  lab and test results on MyChart as soon as they are completed and results have been sent by the lab or testing facility. Due to this service, you will receive your results BEFORE your provider.  . Please allow a minimum of 72 business hours for your provider to receive and review lab and test results and contact you about.   . Most lab and test result comments from the provider will be sent through MyChart. Your provider may recommend changes to the plan of care, follow-up visits, repeat testing, ask questions, or  request an office visit to discuss these results. You may reply directly to this message or call the office at [phone] to provide information for the provider or set up an appointment. . In some instances, you will be called with test results and recommendations. Please let us know if this is preferred and we will make note of this in your chart to provide this for you.    . If you have not heard a response to your lab or test results in 72 business hours, please call the office to let us know.   After Hours: . For all non-emergency after hours needs, please call the office at [phone] and select the option to reach the on-call provider service. On-call services are shared between multiple [place] offices and therefore it will not be possible to speak directly with your provider. On-call providers may provide medical advice and recommendations, but are unable to provide refills for maintenance medications.  . For all emergency or urgent medical needs after normal business hours, we recommend that you seek care at the closest Urgent Care or Emergency Department to ensure appropriate treatment in a timely manner.  [facility] has a 24 hour emergency room located on the ground floor for your convenience.    Please do not hesitate to reach out to [place] with concerns.   Thank you, again, for choosing me as your health care partner. I appreciate your trust and look forward to learning more about you.   SaraBeth Torie Priebe, DNP, AGNP-c _____________________________________________________________   Chronic Venous Insufficiency Chronic venous insufficiency is a condition where the leg veins cannot effectively pump blood from the legs to the heart. This happens when the vein walls are either stretched, weakened, or damaged, or when the valves inside the vein are damaged. With the right treatment, you should be able to continue with an active life. This condition is also called  venous stasis. What are the causes? Common causes of this condition include:  High blood pressure inside the veins (venous hypertension).  Sitting or standing too long, causing increased blood pressure in the leg veins.  A blood clot that blocks blood flow in a vein (deep vein thrombosis, DVT).  Inflammation of a vein (phlebitis) that causes a blood clot to form.  Tumors in the pelvis that cause blood to back up. What increases the risk? The following factors may make you more likely to develop this condition:  Having a family history of this condition.  Obesity.  Pregnancy.  Living without enough regular physical activity or exercise (sedentary lifestyle).  Smoking.  Having a job that requires long periods of standing or sitting in one place.  Being a certain age. Women in their 48s and 90s and men in their 45s are more likely to develop this condition. What are the signs or symptoms? Symptoms of this condition include:  Veins that are enlarged, bulging, or twisted (varicose veins).  Skin breakdown or ulcers.  Reddened skin or dark discoloration of skin on the leg between the knee and ankle.  Brown, smooth, tight, and painful skin just above the ankle, usually on the inside of the leg (lipodermatosclerosis).  Swelling of the legs. How is this diagnosed? This condition may be diagnosed based on:  Your medical history.  A physical exam.  Tests, such as: ? A procedure that creates an image of a blood vessel and nearby organs and provides information about blood flow through the blood vessel (duplex ultrasound). ? A procedure that tests blood flow (plethysmography). ? A procedure that looks at the veins using X-ray and dye (venogram). How is this treated? The goals of treatment are to help you return to an active life and to minimize pain or disability. Treatment depends on the severity of your condition, and it may include:  Wearing compression stockings. These  can help relieve symptoms and help prevent your condition from getting worse. However, they do not cure the condition.  Sclerotherapy. This procedure involves an injection of a solution that shrinks damaged veins.  Surgery. This may involve: ? Removing a diseased vein (vein stripping). ? Cutting off blood flow through the vein (laser ablation surgery). ? Repairing or reconstructing a valve within the affected vein.   Follow these instructions at home:  Wear compression stockings as told by your health care provider. These stockings help to prevent blood clots and reduce swelling in your legs.  Take over-the-counter and prescription medicines only as told by your health care provider.  Stay active by exercising, walking, or doing different activities. Ask your health care provider what activities are safe for you and how much exercise you need.  Drink enough fluid to keep your urine pale yellow.  Do not use any products that contain nicotine or tobacco, such as cigarettes, e-cigarettes, and chewing tobacco. If you need help quitting, ask your health care provider.  Keep all follow-up visits as told by your health care provider. This is important.      Contact a health care provider if you:  Have redness, swelling, or more pain in the affected area.  See a red streak or line that goes up or down from the affected area.  Have skin breakdown or skin loss in the affected area, even if the breakdown is small.  Get an injury in the affected area. Get help right away if:  You get an injury and an open wound in the affected area.  You have: ? Severe pain that does not get better with medicine. ? Sudden numbness or weakness in the foot or ankle below the affected area. ? Trouble moving your foot or ankle. ? A fever. ? Worse or persistent symptoms. ? Chest pain. ? Shortness of breath. Summary  Chronic venous insufficiency is a condition where the leg veins cannot effectively pump  blood from the legs to the heart.  Chronic venous insufficiency occurs when the vein walls become stretched, weakened, or damaged, or when valves within the vein are damaged.  Treatment depends on how severe your condition is. It often involves wearing compression stockings and may involve having a procedure.  Make sure you stay active by exercising, walking, or doing different activities. Ask your health care provider what activities are safe for you and how much exercise you need. This information is not intended to replace advice given to you by your health care provider. Make sure you discuss any questions you have with your health  care provider. Document Revised: 05/31/2018 Document Reviewed: 05/31/2018 Elsevier Patient Education  2021 [place] and Daroff's neurology in clinical practice (8th ed., pp. 2202- 1929). Elsevier."> Goldman-Cecil medicine (26th ed., pp. 2489- 2501). Elsevier.">    Peripheral Neuropathy Peripheral neuropathy is a type of nerve damage. It affects nerves that carry signals between the spinal cord and the arms, legs, and the rest of the body (peripheral nerves). It does not affect nerves in the spinal cord or brain. In peripheral neuropathy, one nerve or a group of nerves may be damaged. Peripheral neuropathy is a broad category that includes many specific nerve disorders, like diabetic neuropathy, hereditary neuropathy, and carpal tunnel syndrome. What are the causes? This condition may be caused by:  Diabetes. This is the most common cause of peripheral neuropathy.  Nerve injury.  Pressure or stress on a nerve that lasts a long time.  Lack (deficiency) of B vitamins. This can result from alcoholism, poor diet, or a restricted diet.  Infections.  Autoimmune diseases, such as rheumatoid arthritis and systemic lupus erythematosus.  Nerve diseases that are passed from parent to child (inherited).  Some medicines, such as cancer medicines  (chemotherapy).  Poisonous (toxic) substances, such as lead and mercury.  Too little blood flowing to the legs.  Kidney disease.  Thyroid disease. In some cases, the cause of this condition is not known. What are the signs or symptoms? Symptoms of this condition depend on which of your nerves is damaged. Common symptoms include:  Loss of feeling (numbness) in the feet, hands, or both.  Tingling in the feet, hands, or both.  Burning pain.  Very sensitive skin.  Weakness.  Not being able to move a part of the body (paralysis).  Muscle twitching.  Clumsiness or poor coordination.  Loss of balance.  Not being able to control your bladder.  Feeling dizzy.  Sexual problems. How is this diagnosed? Diagnosing and finding the cause of peripheral neuropathy can be difficult. Your health care provider will take your medical history and do a physical exam. A neurological exam will also be done. This involves checking things that are affected by your brain, spinal cord, and nerves (nervous system). For example, your health care provider will check your reflexes, how you move, and what you can feel. You may have other tests, such as:  Blood tests.  Electromyogram (EMG) and nerve conduction tests. These tests check nerve function and how well the nerves are controlling the muscles.  Imaging tests, such as CT scans or MRI to rule out other causes of your symptoms.  Removing a small piece of nerve to be examined in a lab (nerve biopsy).  Removing and examining a small amount of the fluid that surrounds the brain and spinal cord (lumbar puncture). How is this treated? Treatment for this condition may involve:  Treating the underlying cause of the neuropathy, such as diabetes, kidney disease, or vitamin deficiencies.  Stopping medicines that can cause neuropathy, such as chemotherapy.  Medicine to help relieve pain. Medicines may include: ? Prescription or over-the-counter pain  medicine. ? Antiseizure medicine. ? Antidepressants. ? Pain-relieving patches that are applied to painful areas of skin.  Surgery to relieve pressure on a nerve or to destroy a nerve that is causing pain.  Physical therapy to help improve movement and balance.  Devices to help you move around (assistive devices). Follow these instructions at home: Medicines  Take over-the-counter and prescription medicines only as told by your health care  provider. Do not take any other medicines without first asking your health care provider.  Do not drive or use heavy machinery while taking prescription pain medicine. Lifestyle  Do not use any products that contain nicotine or tobacco, such as cigarettes and e-cigarettes. Smoking keeps blood from reaching damaged nerves. If you need help quitting, ask your health care provider.  Avoid or limit alcohol. Too much alcohol can cause a vitamin B deficiency, and vitamin B is needed for healthy nerves.  Eat a healthy diet. This includes: ? Eating foods that are high in fiber, such as fresh fruits and vegetables, whole grains, and beans. ? Limiting foods that are high in fat and processed sugars, such as fried or sweet foods.   General instructions  If you have diabetes, work closely with your health care provider to keep your blood sugar under control.  If you have numbness in your feet: ? Check every day for signs of injury or infection. Watch for redness, warmth, and swelling. ? Wear padded socks and comfortable shoes. These help protect your feet.  Develop a good support system. Living with peripheral neuropathy can be stressful. Consider talking with a mental health specialist or joining a support group.  Use assistive devices and attend physical therapy as told by your health care provider. This may include using a walker or a cane.  Keep all follow-up visits as told by your health care provider. This is important.   Contact a health care  provider if:  You have new signs or symptoms of peripheral neuropathy.  You are struggling emotionally from dealing with peripheral neuropathy.  Your pain is not well-controlled. Get help right away if:  You have an injury or infection that is not healing normally.  You develop new weakness in an arm or leg.  You have fallen or do so frequently. Summary  Peripheral neuropathy is when the nerves in the arms, or legs are damaged, resulting in numbness, weakness, or pain.  There are many causes of peripheral neuropathy, including diabetes, pinched nerves, vitamin deficiencies, autoimmune disease, and hereditary conditions.  Diagnosing and finding the cause of peripheral neuropathy can be difficult. Your health care provider will take your medical history, do a physical exam, and do tests, including blood tests and nerve function tests.  Treatment involves treating the underlying cause of the neuropathy and taking medicines to help control pain. Physical therapy and assistive devices may also help. This information is not intended to replace advice given to you by your health care provider. Make sure you discuss any questions you have with your health care provider. Document Revised: 06/18/2020 Document Reviewed: 06/18/2020 Elsevier Patient Education  2021 [place].   Seborrheic Keratosis A seborrheic keratosis is a common, noncancerous (benign) skin growth. These growths are velvety, waxy, rough, tan, brown, or black spots that appear on the skin. These skin growths can be flat or raised, and scaly. What are the causes? The cause of this condition is not known. What increases the risk? You are more likely to develop this condition if you:  Have a family history of seborrheic keratosis.  Are 50 or older.  Are pregnant.  Have had estrogen replacement therapy. What are the signs or symptoms? Symptoms of this condition include growths on the face, chest, shoulders, back, or  other areas. These growths:  Are usually painless, but may become irritated and itchy.  Can be yellow, brown, black, or other colors.  Are slightly raised or have a flat surface.  Are sometimes rough or wart-like in texture.  Are often velvety or waxy on the surface.  Are round or oval-shaped.  Often occur in groups, but may occur as a single growth.   How is this diagnosed? This condition is diagnosed with a medical history and physical exam.  A sample of the growth may be tested (skin biopsy).  You may need to see a skin specialist (dermatologist). How is this treated? Treatment is not usually needed for this condition, unless the growths are irritated or bleed often.  You may also choose to have the growths removed if you do not like their appearance. ? Most commonly, these growths are treated with a procedure in which liquid nitrogen is applied to "freeze" off the growth (cryosurgery). ? They may also be burned off with electricity (electrocautery) or removed by scraping (curettage). Follow these instructions at home:  Watch your growth for any changes.  Keep all follow-up visits as told by your health care provider. This is important.  Do not scratch or pick at the growth or growths. This can cause them to become irritated or infected. Contact a health care provider if:  You suddenly have many new growths.  Your growth bleeds, itches, or hurts.  Your growth suddenly becomes larger or changes color. Summary  A seborrheic keratosis is a common, noncancerous (benign) skin growth.  Treatment is not usually needed for this condition, unless the growths are irritated or bleed often.  Watch your growth for any changes.  Contact a health care provider if you suddenly have many new growths or your growth suddenly becomes larger or changes color.  Keep all follow-up visits as told by your health care provider. This is important. This information is not intended to  replace advice given to you by your health care provider. Make sure you discuss any questions you have with your health care provider. Document Revised: 01/20/2018 Document Reviewed: 01/20/2018 Elsevier Patient Education  2021 [employer].

## 2021-01-02 NOTE — Progress Notes (Signed)
New Patient Office Visit  Subjective:  Patient ID: Kathryn [name], female    DOB: [birth year]  Age: 86 y.o. MRN: [id number]  CC:  Chief Complaint  Patient presents with  . Establish Care    HPI [name] is a pleasant 86 year old female presenting today to establish care with this practice. She is accompanied today by her daughter. She has previously received care from Kathryn [name] with [facility]. Her last annual medicare wellness visit was in April 2021 with labs performed at that time.    LEG PAIN She reports today with concerns for bilateral lower extremity edema, pain, and discoloration. She endorses swelling, aching, numbness, heaviness, and tingling in her lower extremities worse on the right. She states that the lower extremities around the ankles "feel bruised" and are tender to the touch. She also endorses that she feels this affects her balance at times.  She states that often times in the evenings her right foot will be extremely cold in comparison to the rest of her body with no known reason. She also reports increased nocturia with wakening up to 5 times per night.  She reports that she has tried a variety of compression stockings in the past as well as ace wraps to her lower extremities, but she is unable to tolerate these due to the severe pain that this causes. She reports pain to the point of tears when putting on and removing the stockings. She states that she has also tried to elevated her lower extremities while seated, but she feels this is more painful than keeping her feet on the ground.  She reports that she has discussed this issue in the past with her other providers, but has not been able to find relief. She denies increased pain when walking stairs, total loss of sensation in the lower extremities, pain in the calves or behind the knee, extended periods of travel recently, or warmth or redness on one location.   ALLERGIC RHINITIS-  NON SEASONAL She endorses non-seasonal rhinitis with "constant" clear drip from the nose. She reports that at night the drainage runs down her throat and makes it difficult to clear her throat. She also reports sneezing and watery eyes. She denies sinus pain or pressure or ear pain or pressure. She reports her symptoms are continuous. She has used allergy medications in the past but is not sure these were effective. She is not using any medications right now.   ACTINIC KERATOSIS She endorses "barnicles" on her body with a large patch on her abdomen and several on her back. She states that she does have a history of basal and squamous cell carcinoma and has routine dermatological evaluations for her skin to help with identification of places that need removed. She states that her previous PCP told her these were harmless, however, she states that they bother her and she is uncomfortable by their presence. She denies itching, burning, or drainage from the lesions. She would like to have them removed, if possible.   ARTHRITIS She reports chronic arthritis pain in her neck, hands, back, and knees. She reports using tylenol to help with these when necessary and states that this is effective for her pain. She denies any new symptoms or changes to her symptoms at this time.    NOCTURNAL LEG CRAMPS She endorses cramping in her hands and legs with intermittent frequency. She states when she has tried to place her compression stockings she has experienced severe cramps  in her thighs which is worse at night. She has not taken anything for this.   She has a past medical and surgical history pertinent for: Past Medical History:  Diagnosis Date  . Arthritis   . COPD (chronic obstructive pulmonary disease) ([place])   . Dyspnea   . Hypertension   . Hypothyroidism   . Osteopenia   . Pneumonia   . Pulmonary embolism ([place]) 04/2018  . Thyroid disease    Past Surgical History:  Procedure Laterality Date  .  APPENDECTOMY    . EYE SURGERY    . TONSILLECTOMY AND ADENOIDECTOMY    . TOTAL KNEE ARTHROPLASTY Right 01/01/2020   Procedure: RIGHT TOTAL KNEE ARTHROPLASTY;  Surgeon: Leandrew Koyanagi, MD;  Location: [facility];  Service: Orthopedics;  Laterality: Right;  . TUBAL LIGATION     Family History  Problem Relation Age of Onset  . Breast cancer Mother   . Breast cancer Daughter 10   Social History   Socioeconomic History  . Marital status: Divorced    Spouse name: Not on file  . Number of children: Not on file  . Years of education: Not on file  . Highest education level: Not on file  Occupational History  . Not on file  Tobacco Use  . Smoking status: Former Smoker    Quit date: 2018    Years since quitting: 4.2  . Smokeless tobacco: Never Used  Substance and Sexual Activity  . Alcohol use: Never  . Drug use: Never  . Sexual activity: Not on file  Other Topics Concern  . Not on file  Social History Narrative  . Not on file   Social Determinants of Health   Financial Resource Strain: Not on file  Food Insecurity: Not on file  Transportation Needs: Not on file  Physical Activity: Not on file  Stress: Not on file  Social Connections: Not on file  Intimate Partner Violence: Not on file    ROS Review of Systems  Constitutional: Positive for fatigue. Negative for appetite change, chills and fever.  HENT: Positive for postnasal drip, rhinorrhea and sneezing. Negative for ear pain, nosebleeds, sinus pressure, sinus pain, sore throat, trouble swallowing and voice change.   Respiratory: Negative for cough, chest tightness and shortness of breath.   Cardiovascular: Positive for leg swelling. Negative for chest pain and palpitations.  Gastrointestinal: Negative for abdominal distention, abdominal pain, constipation, diarrhea and nausea.  Genitourinary: Positive for frequency and urgency. Negative for difficulty urinating, hematuria, vaginal bleeding, vaginal discharge and vaginal pain.   Musculoskeletal: Positive for arthralgias, gait problem, myalgias, neck pain and neck stiffness.  Skin: Negative for color change, pallor, rash and wound.  Neurological: Positive for weakness and numbness. Negative for dizziness, tremors, facial asymmetry, light-headedness and headaches.  Hematological: Negative for adenopathy. Does not bruise/bleed easily.  Psychiatric/Behavioral: Positive for sleep disturbance. Negative for confusion, decreased concentration, dysphoric mood, self-injury and suicidal ideas. The patient is not nervous/anxious.     Objective:   Today's Vitals: BP (!) 161/71   Pulse 74   Ht 5\' 5"  (1.651 m)   Wt 167 lb 12.8 oz (76.1 kg)   SpO2 97%   BMI 27.92 kg/m   Physical Exam Vitals and nursing note reviewed.  Constitutional:      General: She is not in acute distress.    Appearance: Normal appearance. She is normal weight. She is not ill-appearing.  HENT:     Head: Normocephalic and atraumatic.     Nose: Mucosal edema  and rhinorrhea present. Rhinorrhea is clear.     Mouth/Throat:     Mouth: Mucous membranes are moist.     Pharynx: Oropharynx is clear.  Eyes:     Extraocular Movements: Extraocular movements intact.     Conjunctiva/sclera: Conjunctivae normal.     Pupils: Pupils are equal, round, and reactive to light.  Neck:     Vascular: No carotid bruit or JVD.  Cardiovascular:     Rate and Rhythm: Normal rate and regular rhythm.     Pulses: Normal pulses.          Dorsalis pedis pulses are 2+ on the right side and 2+ on the left side.       Posterior tibial pulses are 2+ on the right side and 2+ on the left side.     Heart sounds: Normal heart sounds. No murmur heard.   Pulmonary:     Effort: Pulmonary effort is normal.     Breath sounds: Wheezing present.     Comments: Expiratory wheeze present in Left lower lobe.  Abdominal:     General: Abdomen is flat. Bowel sounds are normal. There is no distension.     Palpations: Abdomen is soft.      Tenderness: There is no abdominal tenderness. There is no right CVA tenderness, left CVA tenderness or guarding.  Musculoskeletal:     Right lower leg: 2+ Edema present.     Left lower leg: 2+ Pitting Edema present.     Right ankle: Swelling present. Tenderness present. Normal range of motion. Normal pulse.     Left ankle: Swelling present. Tenderness present. Normal range of motion. Normal pulse.     Right foot: Normal range of motion and normal capillary refill. Swelling and tenderness present. No bony tenderness or crepitus. Normal pulse.     Left foot: Normal range of motion and normal capillary refill. Swelling and tenderness present. No bony tenderness or crepitus. Normal pulse.       Legs:     Comments: Bilateral lower extremity edema notably from the knees distally to the toes. Discoloration present to the lower extremities with increased evidence of varicosities present with no wounds or ulcerations present. No tenderness to the calf or popliteal fossa. Tenderness noted to the distal portion of the tibia anteriorly and medially in a bilateral manner consistent with areas of heavy varicosities. Pulses palpable and equal. Coloration of right foot is notably more dusky after seated on exam table with legs dangling.   Feet:     Right foot:     Protective Sensation: 10 sites tested. 10 sites sensed.     Skin integrity: Erythema present. No ulcer, blister, skin breakdown, warmth, callus, dry skin or fissure.     Left foot:     Protective Sensation: 10 sites tested. 10 sites sensed.     Skin integrity: Erythema present. No ulcer, blister, skin breakdown, warmth, callus, dry skin or fissure.  Lymphadenopathy:     Cervical: No cervical adenopathy.  Skin:    General: Skin is warm and dry.       Neurological:     General: No focal deficit present.     Mental Status: She is alert and oriented to person, place, and time.     Cranial Nerves: No cranial nerve deficit.     Sensory: Sensation is  intact. No sensory deficit.     Motor: Motor function is intact.     Coordination: Coordination is intact. Coordination normal.  Gait: Gait abnormal.  Psychiatric:        Mood and Affect: Mood normal.        Behavior: Behavior normal. Behavior is cooperative.        Thought Content: Thought content normal.        Judgment: Judgment normal.     Assessment & Plan:   Problem List Items Addressed This Visit      Cardiovascular and Mediastinum   Benign essential hypertension    Elevated blood pressure readings- reportedly worse in the office due to white coat syndrome. Blood pressure manually and electronically reviewed today bilateraly with Right brachial systolic [id number] and Left brachial systolic [id number].  No changes to medication today.  Will plan to discuss more at upcoming AWV and determine if medications changes are necessary. Given her age, goal blood pressure less than 150/90 appropriate.       Relevant Medications   aspirin 81 MG chewable tablet   Other Relevant Orders   CBC w/Diff/Platelet   Lipid panel   Pulmonary embolus (HCC)    History of PE with 6 month treatment of xarelto. She is not currently on any medications for this. No evidence of shortness of breath, chest pain, chest tightness, or palpations.  No evidence of recurrence at this time. Will continue to monitor and warning signs gone over with patient for when to seek emergency care.       Relevant Medications   aspirin 81 MG chewable tablet   AMBULATORY NON FORMULARY MEDICATION   Venous insufficiency of both lower extremities    Symptoms and presentation consistent with venous insuffiencey of the lower extremities bilaterally. Clear evidence of multiple varicosities. +2 edema present today. Pedal pulses palpable.  ABI performed in office today with readings of 1.2 on the left and right.  Discussed at length treatment options starting with compression. Given her inability to tolerate the compression  stockings due to neuropathy, will order electronic intermittent/sequential compression devices for at home use during the day to see if we can get improvement in circulation Gabapentin prescribed for neuropathic pain.  Will follow at AWV      Relevant Medications   aspirin 81 MG chewable tablet   AMBULATORY NON FORMULARY MEDICATION     Respiratory   Chronic obstructive pulmonary disease, unspecified (HCC)    History of COPD with wheezing auscultated in the left lower lobe today. No shortness of breath, barrel chest, tripoding, and inability to speak in full sentences when walking or seated.  Recommend continued current treatment plan. Exacerbations will likely need to be treated aggressively due to age and co-morbidities. Will follow.       Perennial allergic rhinitis    Symptoms and presentation consistent with allergic rhinitis.  Recommend OTC allergy medication such as Allegra, Zyrtec, Claritin, or Xyzal.  Additionally recommend intranasal corticosteroid as adjunct treatment with Flonase, Rhinocort, or Nasonex to help reduce frequency and volume of rhinitis symptoms.  If 3 month trial does not show improvement, will consider Montelukast for treatment.       Relevant Orders   CBC w/Diff/Platelet     Endocrine   Hypothyroidism    Hypothyroidism controlled with daily levothyroxine. Evidence of numbness and tingling in the lower extremities, leg cramps, fatigue, and weakness present.  Has not had labs in about a year, therefore will order Thyroid panel for evaluation of medication effectiveness.  Will discuss at AWV next week.       Relevant Orders   CBC w/Diff/Platelet   Comprehensive  metabolic panel   Thyroid Panel With TSH     Musculoskeletal and Integument   Osteoarthritis of knee    Multi-joint osteoarthritis present. Right knee replacement last year significantly improved symptoms. Intermittent use of Tylenol as needed for joint pain.  Will monitor liver and kidney  function with labs to ensure this is appropriate.  Recommend over the counter diclofenac gel to the painful areas consistently twice per day to help with pain and inflammation while protecting GI tract and Kidneys. Gentle stretching exercises and intermittent ice and heat to joints may be affective as well.       Relevant Medications   acetaminophen (TYLENOL) 500 MG tablet   aspirin 81 MG chewable tablet   Senile osteopenia    Osteopenia history. DEXA scan is due in the near future. Will place order for DEXA and discuss further with patient at [facility].       Relevant Orders   Comprehensive metabolic panel   Unspecified inflammatory spondylopathy, cervical region [facility])    History of spondylopathy in the cervical region. She reports that she is doing fairly well with this and takes Tylenol as needed for the pain.  No concerns or warning signs present today. Will follow.         Other   Encounter to establish care - Primary   Screening for osteoporosis   Insomnia    Insomnia of unknown origin. Frequent nocturia is likely a culprit and most likely due to venous insufficiency in the lower extremities with reduced edema when laying flat. Will prescribe gabapentin to help with paresthesias that may be causing pain and increased awakening/difficulty falling asleep.  Strongly recommend compression during the day to prevent lower extremity edema and allow for improved circulation.       Relevant Orders   CBC w/Diff/Platelet   Thyroid Panel With TSH   Paresthesia of bilateral legs    Bilateral lower extremity paresthesias. It is unclear at this time if this is resultant of her thyroid, electrolytes, or venous insufficiency. Clear indicators on lower extremities of varicosities and peripheral edema.  Gabapentin prescribed 100-300mg  up to three times a day to see if this is helpful for her pain.  Will follow-up at [facility].       Relevant Medications   gabapentin (NEURONTIN) 100 MG capsule    AMBULATORY NON FORMULARY MEDICATION   Other Relevant Orders   CBC w/Diff/Platelet   Comprehensive metabolic panel   Lipid panel   Thyroid Panel With TSH      Outpatient Encounter Medications as of 01/02/2021  Medication Sig  . acetaminophen (TYLENOL) 500 MG tablet 1 tablet as needed  . amoxicillin (AMOXIL) 500 MG capsule Take 4 pills one hour prior to dental work  . aspirin 81 MG chewable tablet 1 tablet  . Cholecalciferol (VITAMIN D3) 50 MCG (2000 UT) TABS Take 2,000 Units by mouth daily.  Marland Kitchen gabapentin (NEURONTIN) 100 MG capsule Take 100-300mg  (1-3 capsules) up to every 8 hours as needed for leg and foot pain.  Marland Kitchen levothyroxine (SYNTHROID, LEVOTHROID) 100 MCG tablet Take 100 mcg by mouth daily before breakfast.  . losartan (COZAAR) 50 MG tablet Take 50 mg by mouth daily.  . [DISCONTINUED] docusate sodium (COLACE) 100 MG capsule 1 capsule as needed  . [DISCONTINUED] furosemide (LASIX) 20 MG tablet 1 tablet  . [DISCONTINUED] glycerin adult 2 g suppository Place 1 suppository rectally as needed for constipation.  . [DISCONTINUED] ipratropium (ATROVENT) 0.03 % nasal spray 2 applications in each nostril  . [  DISCONTINUED] methocarbamol (ROBAXIN) 500 MG tablet Take 1 tablet (500 mg total) by mouth 2 (two) times daily as needed.  . [DISCONTINUED] mupirocin ointment (BACTROBAN) 2 % Apply topically 2 (two) times daily.  . [DISCONTINUED] ondansetron (ZOFRAN) 4 MG tablet Take 1 tablet (4 mg total) by mouth every 8 (eight) hours as needed for nausea or vomiting.  . [DISCONTINUED] potassium chloride (KLOR-CON) 10 MEQ tablet 1 tablet with food  . [DISCONTINUED] rivaroxaban (XARELTO) 10 MG TABS tablet Take 1 tablet (10 mg total) by mouth daily.  . AMBULATORY NON FORMULARY MEDICATION Bilateral lower extremity intermittent compression device for home use. ICD 10- I26.99, I87.2, R20.2. Device based on insurance coverage for DME supply.  . [DISCONTINUED] oxyCODONE (OXYCONTIN) 10 mg 12 hr tablet Take 1 tablet  (10 mg total) by mouth every 12 (twelve) hours.  . [DISCONTINUED] oxyCODONE-acetaminophen (PERCOCET) 5-325 MG tablet Take 1-2 tablets by mouth 3 (three) times daily as needed for severe pain.   No facility-administered encounter medications on file as of 01/02/2021.    Follow-up: Return in about 4 weeks (around 01/30/2021) for In the next few weeks for AWV with labs.  Time: 75 minutes, >50% spent counseling, care coordination, chart review, and documentation.    Orma Render, NP

## 2021-01-06 ENCOUNTER — Encounter (HOSPITAL_BASED_OUTPATIENT_CLINIC_OR_DEPARTMENT_OTHER): Payer: Self-pay | Admitting: Nurse Practitioner

## 2021-01-06 DIAGNOSIS — I872 Venous insufficiency (chronic) (peripheral): Secondary | ICD-10-CM | POA: Insufficient documentation

## 2021-01-06 DIAGNOSIS — I2699 Other pulmonary embolism without acute cor pulmonale: Secondary | ICD-10-CM | POA: Insufficient documentation

## 2021-01-06 MED ORDER — AMBULATORY NON FORMULARY MEDICATION: 0 refills | Status: DC

## 2021-01-06 NOTE — Assessment & Plan Note (Signed)
Bilateral lower extremity paresthesias. It is unclear at this time if this is resultant of her thyroid, electrolytes, or venous insufficiency. Clear indicators on lower extremities of varicosities and peripheral edema.  Gabapentin prescribed 100-300mg  up to three times a day to see if this is helpful for her pain.  Will follow-up at [facility].

## 2021-01-06 NOTE — Assessment & Plan Note (Signed)
Elevated blood pressure readings- reportedly worse in the office due to white coat syndrome. Blood pressure manually and electronically reviewed today bilateraly with Right brachial systolic [id number] and Left brachial systolic 709KHVF.  No changes to medication today.  Will plan to discuss more at upcoming AWV and determine if medications changes are necessary. Given her age, goal blood pressure less than 150/90 appropriate.

## 2021-01-06 NOTE — Assessment & Plan Note (Signed)
Multi-joint osteoarthritis present. Right knee replacement last year significantly improved symptoms. Intermittent use of Tylenol as needed for joint pain.  Will monitor liver and kidney function with labs to ensure this is appropriate.  Recommend over the counter diclofenac gel to the painful areas consistently twice per day to help with pain and inflammation while protecting GI tract and Kidneys. Gentle stretching exercises and intermittent ice and heat to joints may be affective as well.

## 2021-01-06 NOTE — Assessment & Plan Note (Signed)
Hypothyroidism controlled with daily levothyroxine. Evidence of numbness and tingling in the lower extremities, leg cramps, fatigue, and weakness present.  Has not had labs in about a year, therefore will order Thyroid panel for evaluation of medication effectiveness.  Will discuss at AWV next week.

## 2021-01-06 NOTE — Assessment & Plan Note (Signed)
History of COPD with wheezing auscultated in the left lower lobe today. No shortness of breath, barrel chest, tripoding, and inability to speak in full sentences when walking or seated.  Recommend continued current treatment plan. Exacerbations will likely need to be treated aggressively due to age and co-morbidities. Will follow.

## 2021-01-06 NOTE — Assessment & Plan Note (Signed)
Insomnia of unknown origin. Frequent nocturia is likely a culprit and most likely due to venous insufficiency in the lower extremities with reduced edema when laying flat. Will prescribe gabapentin to help with paresthesias that may be causing pain and increased awakening/difficulty falling asleep.  Strongly recommend compression during the day to prevent lower extremity edema and allow for improved circulation.

## 2021-01-06 NOTE — Assessment & Plan Note (Signed)
>>  ASSESSMENT AND PLAN FOR PARESTHESIA OF BILATERAL LEGS WRITTEN ON 01/06/2021  7:21 AM BY Maily Debarge E, NP  Bilateral lower extremity paresthesias. It is unclear at this time if this is resultant of her thyroid , electrolytes, or venous insufficiency. Clear indicators on lower extremities of varicosities and peripheral edema.  Gabapentin  prescribed 100-300mg  up to three times a day to see if this is helpful for her pain.  Will follow-up at [facility].

## 2021-01-06 NOTE — Assessment & Plan Note (Addendum)
Symptoms and presentation consistent with venous insuffiencey of the lower extremities bilaterally. Clear evidence of multiple varicosities. +2 edema present today. Pedal pulses palpable.  ABI performed in office today with readings of 1.2 on the left and right.  Discussed at length treatment options starting with compression. Given her inability to tolerate the compression stockings due to neuropathy, will order electronic intermittent/sequential compression devices for at home use during the day to see if we can get improvement in circulation Gabapentin prescribed for neuropathic pain.  Will follow at [facility]

## 2021-01-06 NOTE — Assessment & Plan Note (Signed)
History of PE with 6 month treatment of xarelto. She is not currently on any medications for this. No evidence of shortness of breath, chest pain, chest tightness, or palpations.  No evidence of recurrence at this time. Will continue to monitor and warning signs gone over with patient for when to seek emergency care.

## 2021-01-06 NOTE — Assessment & Plan Note (Signed)
Osteopenia history. DEXA scan is due in the near future. Will place order for DEXA and discuss further with patient at [facility].

## 2021-01-06 NOTE — Assessment & Plan Note (Signed)
History of spondylopathy in the cervical region. She reports that she is doing fairly well with this and takes Tylenol as needed for the pain.  No concerns or warning signs present today. Will follow.

## 2021-01-06 NOTE — Assessment & Plan Note (Signed)
Symptoms and presentation consistent with allergic rhinitis.  Recommend OTC allergy medication such as Allegra, Zyrtec, Claritin, or Xyzal.  Additionally recommend intranasal corticosteroid as adjunct treatment with Flonase, Rhinocort, or Nasonex to help reduce frequency and volume of rhinitis symptoms.  If 3 month trial does not show improvement, will consider Montelukast for treatment.

## 2021-01-08 ENCOUNTER — Other Ambulatory Visit (HOSPITAL_BASED_OUTPATIENT_CLINIC_OR_DEPARTMENT_OTHER)
Admission: RE | Admit: 2021-01-08 | Discharge: 2021-01-08 | Disposition: A | Discharge status: Home / Self Care | Payer: Medicare Other | Source: Ambulatory Visit | Attending: Nurse Practitioner | Admitting: Nurse Practitioner

## 2021-01-08 ENCOUNTER — Other Ambulatory Visit: Payer: Self-pay

## 2021-01-08 DIAGNOSIS — E039 Hypothyroidism, unspecified: Secondary | ICD-10-CM | POA: Diagnosis not present

## 2021-01-08 DIAGNOSIS — G4700 Insomnia, unspecified: Secondary | ICD-10-CM | POA: Diagnosis not present

## 2021-01-08 DIAGNOSIS — I10 Essential (primary) hypertension: Secondary | ICD-10-CM | POA: Insufficient documentation

## 2021-01-08 DIAGNOSIS — R202 Paresthesia of skin: Secondary | ICD-10-CM | POA: Diagnosis not present

## 2021-01-08 DIAGNOSIS — M8580 Other specified disorders of bone density and structure, unspecified site: Secondary | ICD-10-CM | POA: Insufficient documentation

## 2021-01-08 DIAGNOSIS — J3089 Other allergic rhinitis: Secondary | ICD-10-CM | POA: Insufficient documentation

## 2021-01-08 LAB — CBC WITH DIFFERENTIAL/PLATELET
Abs Immature Granulocytes: 0.02 10*3/uL (ref 0.00–0.07)
Basophils Absolute: 0.1 10*3/uL (ref 0.0–0.1)
Basophils Relative: 1 %
Eosinophils Absolute: 0.3 10*3/uL (ref 0.0–0.5)
Eosinophils Relative: 4 %
HCT: 40.9 % (ref 36.0–46.0)
Hemoglobin: 13.2 g/dL (ref 12.0–15.0)
Immature Granulocytes: 0 %
Lymphocytes Relative: 30 %
Lymphs Abs: 2 10*3/uL (ref 0.7–4.0)
MCH: 29.3 pg (ref 26.0–34.0)
MCHC: 32.3 g/dL (ref 30.0–36.0)
MCV: 90.9 fL (ref 80.0–100.0)
Monocytes Absolute: 0.5 10*3/uL (ref 0.1–1.0)
Monocytes Relative: 7 %
Neutro Abs: 3.8 10*3/uL (ref 1.7–7.7)
Neutrophils Relative %: 58 %
Platelets: 214 10*3/uL (ref 150–400)
RBC: 4.5 MIL/uL (ref 3.87–5.11)
RDW: 14.6 % (ref 11.5–15.5)
WBC: 6.6 10*3/uL (ref 4.0–10.5)
nRBC: 0 % (ref 0.0–0.2)

## 2021-01-08 LAB — COMPREHENSIVE METABOLIC PANEL
ALT: 11 U/L (ref 0–44)
AST: 17 U/L (ref 15–41)
Albumin: 4.2 g/dL (ref 3.5–5.0)
Alkaline Phosphatase: 66 U/L (ref 38–126)
Anion gap: 6 (ref 5–15)
BUN: 11 mg/dL (ref 8–23)
CO2: 31 mmol/L (ref 22–32)
Calcium: 9.5 mg/dL (ref 8.9–10.3)
Chloride: 102 mmol/L (ref 98–111)
Creatinine, Ser: 0.71 mg/dL (ref 0.44–1.00)
GFR, Estimated: >60 mL/min (ref >60)
Glucose, Bld: 93 mg/dL (ref 70–99)
Potassium: 4.2 mmol/L (ref 3.5–5.1)
Sodium: 139 mmol/L (ref 135–145)
Total Bilirubin: 0.7 mg/dL (ref 0.3–1.2)
Total Protein: 7.5 g/dL (ref 6.5–8.1)

## 2021-01-08 LAB — LIPID PANEL
Cholesterol: 188 mg/dL (ref 0–200)
HDL: 72 mg/dL (ref >40)
LDL Cholesterol: 98 mg/dL (ref 0–99)
Total CHOL/HDL Ratio: 2.6 RATIO
Triglycerides: 92 mg/dL (ref <150)
VLDL: 18 mg/dL (ref 0–40)

## 2021-01-08 NOTE — Progress Notes (Signed)
Labs look terrific. Still waiting on TSH. Will review at upcoming appointment.

## 2021-01-10 ENCOUNTER — Telehealth (HOSPITAL_BASED_OUTPATIENT_CLINIC_OR_DEPARTMENT_OTHER): Payer: Self-pay

## 2021-01-10 ENCOUNTER — Other Ambulatory Visit: Payer: Self-pay

## 2021-01-10 ENCOUNTER — Encounter (HOSPITAL_BASED_OUTPATIENT_CLINIC_OR_DEPARTMENT_OTHER): Payer: Self-pay | Admitting: Nurse Practitioner

## 2021-01-10 ENCOUNTER — Ambulatory Visit (INDEPENDENT_AMBULATORY_CARE_PROVIDER_SITE_OTHER): Payer: Medicare Other | Admitting: Nurse Practitioner

## 2021-01-10 VITALS — BP 143/65 | HR 74 | Ht 65.0 in | Wt 170.0 lb

## 2021-01-10 DIAGNOSIS — I878 Other specified disorders of veins: Secondary | ICD-10-CM

## 2021-01-10 DIAGNOSIS — I872 Venous insufficiency (chronic) (peripheral): Secondary | ICD-10-CM | POA: Diagnosis not present

## 2021-01-10 DIAGNOSIS — Z0000 Encounter for general adult medical examination without abnormal findings: Secondary | ICD-10-CM | POA: Insufficient documentation

## 2021-01-10 HISTORY — DX: Other specified disorders of veins: I87.8

## 2021-01-10 HISTORY — DX: Encounter for general adult medical examination without abnormal findings: Z00.00

## 2021-01-10 LAB — THYROID PANEL WITH TSH
Free Thyroxine Index: 2.5 (ref 1.2–4.9)
T3 Uptake Ratio: 28 % (ref 24–39)
T4, Total: 9 ug/dL (ref 4.5–12.0)
TSH: 3.07 u[IU]/mL (ref 0.450–4.500)

## 2021-01-10 NOTE — Telephone Encounter (Signed)
Results released by Sarabeth Early, AGNP and reviewed with patient at [facility] appointment on 01/10/21 @ 9:10am. 

## 2021-01-10 NOTE — Patient Instructions (Addendum)
Your labs were all normal, which is great!  I was able to find out that Medicare will only cover the compression devices if there is a wound present- SO UNFORTUNATE! But, I did find a velcro wrap on [employer] that is reasonably priced that may be a good option. This could allow you to put it on and take it off by yourself and increase or decrease the amount of compression based on your pain tolerance. Let me know if you get these to start using them and how they work for you!   If you start having any problems, please let me know.   Once we get the swelling and pain down a little I want you to try to start walking again for about 10 minutes a day. It doesn't have to be fast walking, but just getting you up and moving.   Kathryn [name] , Thank you for taking time to come for your Medicare Wellness Visit. I appreciate your ongoing commitment to your health goals. Please review the following plan we discussed and let me know if I can assist you in the future.   These are the goals we discussed: Goals    . Weight (lb) < 155 lb (70.3 kg)     Would like to loose 10 pounds in the next year.        This is a list of the screening recommended for you and due dates:  Health Maintenance  Topic Date Due  . DEXA scan (bone density measurement)  Never done  . Tetanus Vaccine  04/13/2021  . Flu Shot  04/21/2021  . COVID-19 Vaccine  Completed  . Pneumonia vaccines  Completed  . HPV Vaccine  Aged Out    Chronic Venous Insufficiency Chronic venous insufficiency is a condition where the leg veins cannot effectively pump blood from the legs to the heart. This happens when the vein walls are either stretched, weakened, or damaged, or when the valves inside the vein are damaged. With the right treatment, you should be able to continue with an active life. This condition is also called venous stasis. What are the causes? Common causes of this condition include:  High blood pressure inside the veins (venous  hypertension).  Sitting or standing too long, causing increased blood pressure in the leg veins.  A blood clot that blocks blood flow in a vein (deep vein thrombosis, DVT).  Inflammation of a vein (phlebitis) that causes a blood clot to form.  Tumors in the pelvis that cause blood to back up. What increases the risk? The following factors may make you more likely to develop this condition:  Having a family history of this condition.  Obesity.  Pregnancy.  Living without enough regular physical activity or exercise (sedentary lifestyle).  Smoking.  Having a job that requires long periods of standing or sitting in one place.  Being a certain age. Women in their 48s and 3s and men in their 51s are more likely to develop this condition. What are the signs or symptoms? Symptoms of this condition include:  Veins that are enlarged, bulging, or twisted (varicose veins).  Skin breakdown or ulcers.  Reddened skin or dark discoloration of skin on the leg between the knee and ankle.  Brown, smooth, tight, and painful skin just above the ankle, usually on the inside of the leg (lipodermatosclerosis).  Swelling of the legs. How is this diagnosed? This condition may be diagnosed based on:  Your medical history.  A physical exam.  Tests, such as: ? A procedure that creates an image of a blood vessel and nearby organs and provides information about blood flow through the blood vessel (duplex ultrasound). ? A procedure that tests blood flow (plethysmography). ? A procedure that looks at the veins using X-ray and dye (venogram). How is this treated? The goals of treatment are to help you return to an active life and to minimize pain or disability. Treatment depends on the severity of your condition, and it may include:  Wearing compression stockings. These can help relieve symptoms and help prevent your condition from getting worse. However, they do not cure the  condition.  Sclerotherapy. This procedure involves an injection of a solution that shrinks damaged veins.  Surgery. This may involve: ? Removing a diseased vein (vein stripping). ? Cutting off blood flow through the vein (laser ablation surgery). ? Repairing or reconstructing a valve within the affected vein.   Follow these instructions at home:  Wear compression stockings as told by your health care provider. These stockings help to prevent blood clots and reduce swelling in your legs.  Take over-the-counter and prescription medicines only as told by your health care provider.  Stay active by exercising, walking, or doing different activities. Ask your health care provider what activities are safe for you and how much exercise you need.  Drink enough fluid to keep your urine pale yellow.  Do not use any products that contain nicotine or tobacco, such as cigarettes, e-cigarettes, and chewing tobacco. If you need help quitting, ask your health care provider.  Keep all follow-up visits as told by your health care provider. This is important.      Contact a health care provider if you:  Have redness, swelling, or more pain in the affected area.  See a red streak or line that goes up or down from the affected area.  Have skin breakdown or skin loss in the affected area, even if the breakdown is small.  Get an injury in the affected area. Get help right away if:  You get an injury and an open wound in the affected area.  You have: ? Severe pain that does not get better with medicine. ? Sudden numbness or weakness in the foot or ankle below the affected area. ? Trouble moving your foot or ankle. ? A fever. ? Worse or persistent symptoms. ? Chest pain. ? Shortness of breath. Summary  Chronic venous insufficiency is a condition where the leg veins cannot effectively pump blood from the legs to the heart.  Chronic venous insufficiency occurs when the vein walls become  stretched, weakened, or damaged, or when valves within the vein are damaged.  Treatment depends on how severe your condition is. It often involves wearing compression stockings and may involve having a procedure.  Make sure you stay active by exercising, walking, or doing different activities. Ask your health care provider what activities are safe for you and how much exercise you need. This information is not intended to replace advice given to you by your health care provider. Make sure you discuss any questions you have with your health care provider. Document Revised: 05/31/2018 Document Reviewed: 05/31/2018 Elsevier Patient Education  2021 [employer].

## 2021-01-10 NOTE — Telephone Encounter (Signed)
-----   Message from Orma Render, NP sent at 01/08/2021  4:18 PM EDT ----- Labs look terrific. Still waiting on TSH. Will review at upcoming appointment.

## 2021-01-10 NOTE — Progress Notes (Signed)
Results normal. Will discuss at upcoming AWV

## 2021-01-10 NOTE — Telephone Encounter (Signed)
-----   Message from Orma Render, NP sent at 01/10/2021  7:02 AM EDT ----- Results normal. Will discuss at upcoming AWV

## 2021-01-10 NOTE — Telephone Encounter (Signed)
Results released by Worthy Keeler, AGNP and reviewed with patient at [facility] appointment on 01/10/21 @ 9:10am.

## 2021-01-10 NOTE — Progress Notes (Signed)
Subjective:   Kathryn [name] is a 86 y.o. female who presents for Medicare Annual (Subsequent) preventive examination.  Review of Systems    Negative with exception of lower extremity edema and discoloration  Cardiac Risk Factors include: advanced age (>33men, >48 women);dyslipidemia;hypertension;sedentary lifestyle     Objective:    Today's Vitals   01/10/21 1100  BP: (!) 143/65  Pulse: 74  SpO2: 95%  Weight: 170 lb (77.1 kg)  Height: 5\' 5"  (1.651 m)   Body mass index is 28.29 kg/m.  Advanced Directives 01/10/2021 01/25/2020 12/28/2019 05/18/2018  Does Patient Have a Medical Advance Directive? Yes Yes Yes No  Type of Paramedic of [place];Living will [place];Living will [place] -  Does patient want to make changes to medical advance directive? Yes (MAU/Ambulatory/Procedural Areas - Information given) No - Patient declined No - Patient declined -  Copy of [place] in Chart? No - copy requested No - copy requested - -    Current Medications (verified) Outpatient Encounter Medications as of 01/10/2021  Medication Sig  . acetaminophen (TYLENOL) 500 MG tablet 1 tablet as needed  . AMBULATORY NON FORMULARY MEDICATION Bilateral lower extremity intermittent compression device for home use. ICD 10- I26.99, I87.2, R20.2. Device based on insurance coverage for DME supply.  Marland Kitchen amoxicillin (AMOXIL) 500 MG capsule Take 4 pills one hour prior to dental work  . aspirin 81 MG chewable tablet 1 tablet  . Cholecalciferol (VITAMIN D3) 50 MCG (2000 UT) TABS Take 2,000 Units by mouth daily.  Marland Kitchen gabapentin (NEURONTIN) 100 MG capsule Take 100-300mg  (1-3 capsules) up to every 8 hours as needed for leg and foot pain.  Marland Kitchen levothyroxine (SYNTHROID, LEVOTHROID) 100 MCG tablet Take 100 mcg by mouth daily before breakfast.  . losartan (COZAAR) 50 MG tablet Take 50 mg by mouth daily.   No facility-administered  encounter medications on file as of 01/10/2021.    Allergies (verified) Lisinopril   History: Past Medical History:  Diagnosis Date  . Arthritis   . COPD (chronic obstructive pulmonary disease) ([place])   . Dyspnea   . Hypertension   . Hypothyroidism   . Osteopenia   . Pneumonia   . Pulmonary embolism ([place]) 04/2018  . Thyroid disease    Past Surgical History:  Procedure Laterality Date  . APPENDECTOMY    . EYE SURGERY    . TONSILLECTOMY AND ADENOIDECTOMY    . TOTAL KNEE ARTHROPLASTY Right 01/01/2020   Procedure: RIGHT TOTAL KNEE ARTHROPLASTY;  Surgeon: Leandrew Koyanagi, MD;  Location: [facility];  Service: Orthopedics;  Laterality: Right;  . TUBAL LIGATION    . TUBAL LIGATION Bilateral    Family History  Problem Relation Age of Onset  . Breast cancer Mother   . Liver cancer Mother   . Breast cancer Daughter 45  . Colon cancer Daughter   . Hypothyroidism Daughter   . Lung cancer Daughter   . Hypothyroidism Daughter   . Hypothyroidism Daughter    Social History   Socioeconomic History  . Marital status: Divorced    Spouse name: Not on file  . Number of children: 5  . Years of education: Not on file  . Highest education level: Not on file  Occupational History  . Not on file  Tobacco Use  . Smoking status: Former Smoker    Quit date: 2018    Years since quitting: 4.3  . Smokeless tobacco: Never Used  Vaping Use  .  Vaping Use: Never used  Substance and Sexual Activity  . Alcohol use: Never  . Drug use: Never  . Sexual activity: Not Currently    Birth control/protection: Abstinence  Other Topics Concern  . Not on file  Social History Narrative  . Not on file   Social Determinants of Health   Financial Resource Strain: Low Risk   . Difficulty of Paying Living Expenses: Not hard at all  Food Insecurity: No Food Insecurity  . Worried About Charity fundraiser in the Last Year: Never true  . Ran Out of Food in the Last Year: Never true  Transportation Needs: No  Transportation Needs  . Lack of Transportation (Medical): No  . Lack of Transportation (Non-Medical): No  Physical Activity: Inactive  . Days of Exercise per Week: 0 days  . Minutes of Exercise per Session: 0 min  Stress: No Stress Concern Present  . Feeling of Stress : Not at all  Social Connections: Socially Isolated  . Frequency of Communication with Friends and Family: More than three times a week  . Frequency of Social Gatherings with Friends and Family: More than three times a week  . Attends Religious Services: Never  . Active Member of Clubs or Organizations: No  . Attends Archivist Meetings: Never  . Marital Status: Divorced    Tobacco Counseling Counseling given: Not Answered Non-smoker, quit 4 years ago. Approximate 60 year PPD smoking history.   Clinical Intake:  Pre-visit preparation completed: Yes  Pain : No/denies pain     BMI - recorded: 28.29 Nutritional Status: BMI 25 -29 Overweight Nutritional Risks: None Diabetes: No  How often do you need to have someone help you when you read instructions, pamphlets, or other written materials from your doctor or pharmacy?: 1 - Never  Interpreter Needed?: No  Information entered by :: Worthy Keeler, DNP   Activities of Daily Living In your present state of health, do you have any difficulty performing the following activities: 01/10/2021  Hearing? N  Vision? N  Difficulty concentrating or making decisions? Y  Walking or climbing stairs? Y  Dressing or bathing? N  Doing errands, shopping? N  Preparing Food and eating ? N  Using the Toilet? N  In the past six months, have you accidently leaked urine? N  Do you have problems with loss of bowel control? N  Managing your Medications? N  Managing your Finances? N  Housekeeping or managing your Housekeeping? N  Some recent data might be hidden    Patient Care Team: Yuvraj Pfeifer, Coralee Pesa, NP as PCP - General (Nurse Practitioner)  Indicate any recent  Medical Services you may have received from other than [facility] providers in the past year (date may be approximate). None    Assessment:   This is a routine wellness examination for Kathryn [name].  Dietary issues and exercise activities discussed: Current Exercise Habits: The patient does not participate in regular exercise at present, Exercise limited by: orthopedic condition(s);Other - see comments (edema)  Goals    . Weight (lb) < 155 lb (70.3 kg)     Would like to loose 10 pounds in the next year.       Depression Screen PHQ 2/9 Scores 01/10/2021 01/02/2021  PHQ - 2 Score 0 0  PHQ- 9 Score - 4    Fall Risk Fall Risk  01/10/2021 01/02/2021  Falls in the past year? 0 0  Number falls in past yr: 0 0  Risk for fall due to :  No Fall Risks No Fall Risks  Follow up Falls evaluation completed -    FALL RISK PREVENTION PERTAINING TO THE HOME:  Any stairs in or around the home? Yes  If so, are there any without handrails? No  Home free of loose throw rugs in walkways, pet beds, electrical cords, etc? No  Adequate lighting in your home to reduce risk of falls? Yes   ASSISTIVE DEVICES UTILIZED TO PREVENT FALLS:  Life alert? No  Use of a cane, walker or w/c? No  Grab bars in the bathroom? No  Shower chair or bench in shower? No  Elevated toilet seat or a handicapped toilet? No   TIMED UP AND GO:  Was the test performed? Yes .  Length of time to ambulate 10 feet: 10 sec.   Gait slow and steady without use of assistive device  Cognitive Function:     6CIT Screen 01/10/2021  What Year? 0 points  What month? 0 points  What time? 0 points  Count back from 20 0 points  Months in reverse 0 points  Repeat phrase 0 points  Total Score 0    Immunizations Immunization History  Administered Date(s) Administered  . Influenza Split 07/31/2011, 07/15/2013, 07/04/2016, 08/21/2020  . Influenza, High Dose Seasonal PF 05/24/2014, 05/30/2015, 08/18/2017, 06/14/2018, 08/29/2019  .  PFIZER(Purple Top)SARS-COV-2 Vaccination 11/19/2019, 12/13/2019, 08/23/2020  . Pneumococcal Conjugate-13 05/24/2014  . Pneumococcal Polysaccharide-23 01/26/2003  . Td 01/26/2003  . Tdap 04/14/2011    TDAP status: Up to date  Flu Vaccine status: Up to date  Pneumococcal vaccine status: Up to date  Covid-19 vaccine status: Completed vaccines  Qualifies for Shingles Vaccine? No   Zostavax completed No   Shingrix Completed?: Yes  Screening Tests Health Maintenance  Topic Date Due  . DEXA SCAN  Never done  . TETANUS/TDAP  04/13/2021  . INFLUENZA VACCINE  04/21/2021  . COVID-19 Vaccine  Completed  . PNA vac Low Risk Adult  Completed  . HPV VACCINES  Aged Out    Health Maintenance  Health Maintenance Due  Topic Date Due  . DEXA SCAN  Never done    Colorectal cancer screening: No longer required.   Mammogram status: Completed in past year. Repeat every year  Bone Density status: Completed previous- patient declines additional screening. Results reflect: Bone density results: OSTEOPENIA. Repeat every 5 years.  Lung Cancer Screening: (Low Dose CT Chest recommended if Age 84-80 years, 30 pack-year currently smoking OR have quit w/in 15years.) does not qualify.   Lung Cancer Screening Referral: N/A  Additional Screening:  Hepatitis C Screening: does not qualify; Completed   Vision Screening: Recommended annual ophthalmology exams for Kathryn [name] detection of glaucoma and other disorders of the eye. Is the patient up to date with their annual eye exam?  Yes  If pt is not established with a provider, would they like to be referred to a provider to establish care? N/A.   Dental Screening: Recommended annual dental exams for proper oral hygiene  Community Resource Referral / Chronic Care Management: CRR required this visit?  No   CCM required this visit?  No      Plan:     Compression devices discussed and recommended at today's visit with recommendations for daily wear  and continue to keep legs elevated while seated to help with venous return.  Recommend frequent monitoring of lower extremities for redness or wounds and to notify immediately if any of these present.   Recommend gabapentin at bedtime for leg  pain as needed.   I have personally reviewed and noted the following in the patient's chart:   . Medical and social history . Use of alcohol, tobacco or illicit drugs  . Current medications and supplements . Functional ability and status . Nutritional status . Physical activity . Advanced directives . List of other physicians . Hospitalizations, surgeries, and ER visits in previous 12 months . Vitals . Screenings to include cognitive, depression, and falls . Referrals and appointments  In addition, I have reviewed and discussed with patient certain preventive protocols, quality metrics, and best practice recommendations. A written personalized care plan for preventive services as well as general preventive health recommendations were provided to patient.   Follow-up in 1 year for annual medicare wellness exam or sooner if needed.   Orma Render, NP   01/10/2021

## 2021-03-25 ENCOUNTER — Other Ambulatory Visit (HOSPITAL_BASED_OUTPATIENT_CLINIC_OR_DEPARTMENT_OTHER): Payer: Self-pay

## 2021-03-25 MED ORDER — LEVOTHYROXINE SODIUM 100 MCG PO TABS: 100.0000 ug | ORAL_TABLET | Freq: Every day | ORAL | 2 refills | Status: DC

## 2021-03-25 MED ORDER — LEVOTHYROXINE SODIUM 100 MCG PO TABS: 100.0000 ug | ORAL_TABLET | Freq: Every day | ORAL | 9 refills | Status: DC

## 2021-06-15 DIAGNOSIS — Z23 Encounter for immunization: Secondary | ICD-10-CM | POA: Diagnosis not present

## 2021-08-01 ENCOUNTER — Other Ambulatory Visit (HOSPITAL_BASED_OUTPATIENT_CLINIC_OR_DEPARTMENT_OTHER): Payer: Self-pay

## 2021-09-01 ENCOUNTER — Telehealth (HOSPITAL_BASED_OUTPATIENT_CLINIC_OR_DEPARTMENT_OTHER): Payer: Self-pay

## 2021-09-01 ENCOUNTER — Other Ambulatory Visit (HOSPITAL_BASED_OUTPATIENT_CLINIC_OR_DEPARTMENT_OTHER): Payer: Self-pay

## 2021-09-01 MED ORDER — LOSARTAN POTASSIUM 50 MG PO TABS
50.0000 mg | ORAL_TABLET | Freq: Every day | ORAL | 1 refills | Status: DC
  Filled 2021-09-01: qty 90, 90d supply, fill #0
  Filled 2021-11-25: qty 90, 90d supply, fill #1

## 2021-09-01 NOTE — Telephone Encounter (Signed)
Patient's daughter called in requesting a med refill for Losartan 50 mg 90 days sent to the [facility]. Please advise

## 2021-09-01 NOTE — Telephone Encounter (Signed)
Rx sent to [facility]

## 2021-10-16 ENCOUNTER — Other Ambulatory Visit: Payer: Self-pay | Admitting: Nurse Practitioner

## 2021-10-16 DIAGNOSIS — Z1231 Encounter for screening mammogram for malignant neoplasm of breast: Secondary | ICD-10-CM

## 2021-11-12 DIAGNOSIS — H35363 Drusen (degenerative) of macula, bilateral: Secondary | ICD-10-CM | POA: Diagnosis not present

## 2021-11-20 ENCOUNTER — Ambulatory Visit
Admission: RE | Admit: 2021-11-20 | Discharge: 2021-11-20 | Disposition: A | Discharge status: Home / Self Care | Payer: Medicare Other | Source: Ambulatory Visit | Attending: Nurse Practitioner | Admitting: Nurse Practitioner

## 2021-11-20 DIAGNOSIS — Z1231 Encounter for screening mammogram for malignant neoplasm of breast: Secondary | ICD-10-CM | POA: Diagnosis not present

## 2021-11-24 IMAGING — CR DG CHEST 2V
2 series · 2 of 2 positions shown · non-contrast
Comparison: 05/24/2019

CLINICAL DATA: Preop for right knee replacement

EXAM:
CHEST - 2 VIEW

[w chest pa]
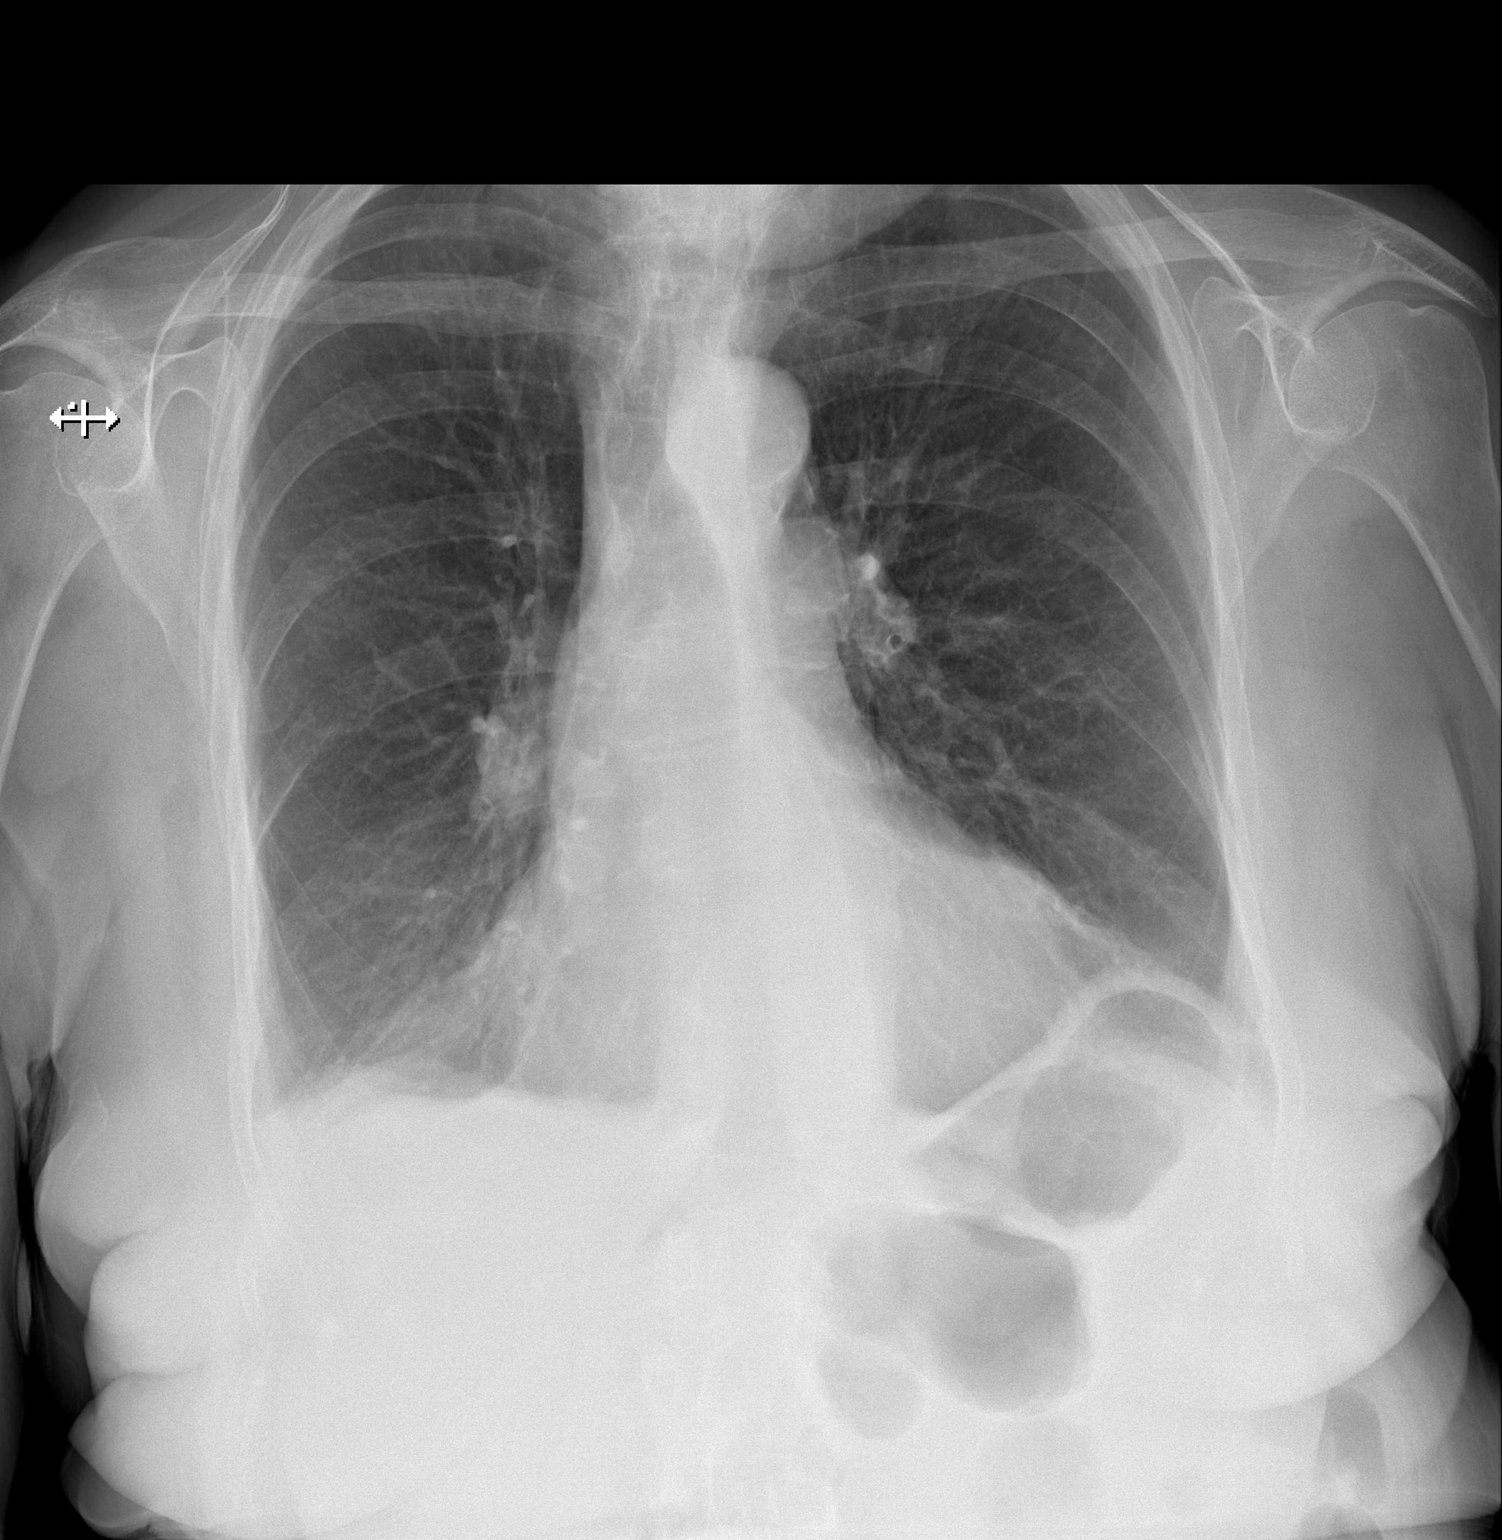

[w chest lat]
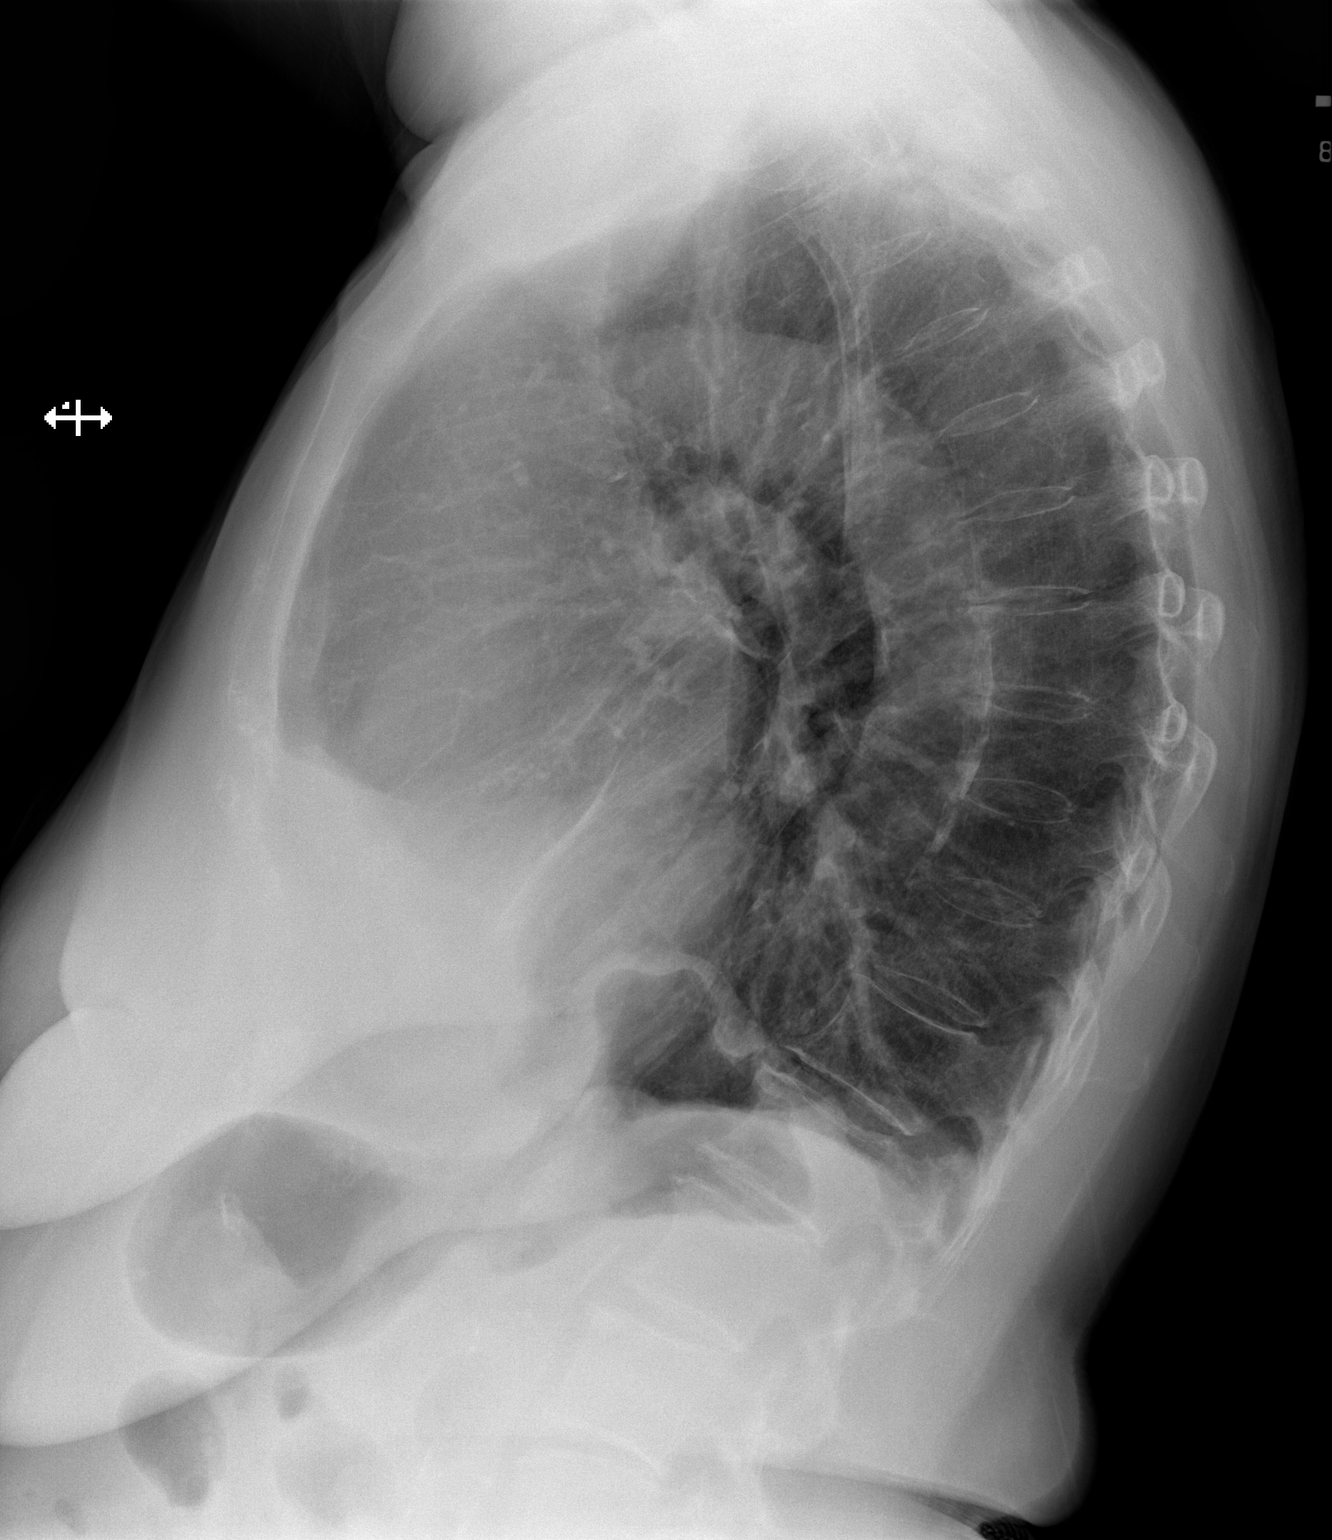

[2 of 2 positions shown; findings below may reference images not displayed]

FINDINGS: The lungs are hyperinflated with diffuse interstitial prominence. No
focal airspace consolidation or pulmonary edema. No pleural effusion
or pneumothorax. Normal cardiomediastinal contours. Nodular opacity
previously visualized in the left mid lung has resolved.
IMPRESSION: COPD without acute airspace disease.

## 2021-11-25 ENCOUNTER — Other Ambulatory Visit (HOSPITAL_BASED_OUTPATIENT_CLINIC_OR_DEPARTMENT_OTHER): Payer: Self-pay

## 2021-12-02 ENCOUNTER — Other Ambulatory Visit (HOSPITAL_BASED_OUTPATIENT_CLINIC_OR_DEPARTMENT_OTHER): Payer: Self-pay

## 2021-12-02 DIAGNOSIS — H401131 Primary open-angle glaucoma, bilateral, mild stage: Secondary | ICD-10-CM | POA: Diagnosis not present

## 2021-12-02 MED ORDER — LATANOPROST 0.005 % OP SOLN
OPHTHALMIC | 4 refills | Status: DC
  Filled 2021-12-02: qty 2.5, 25d supply, fill #0

## 2021-12-15 ENCOUNTER — Other Ambulatory Visit (HOSPITAL_BASED_OUTPATIENT_CLINIC_OR_DEPARTMENT_OTHER): Payer: Self-pay

## 2021-12-15 ENCOUNTER — Other Ambulatory Visit (HOSPITAL_BASED_OUTPATIENT_CLINIC_OR_DEPARTMENT_OTHER): Payer: Self-pay | Admitting: Nurse Practitioner

## 2021-12-15 MED ORDER — LEVOTHYROXINE SODIUM 100 MCG PO TABS
100.0000 ug | ORAL_TABLET | Freq: Every day | ORAL | 0 refills | Status: DC
  Filled 2021-12-15: qty 90, 90d supply, fill #0

## 2021-12-25 ENCOUNTER — Encounter: Payer: Self-pay | Admitting: Orthopaedic Surgery

## 2021-12-25 ENCOUNTER — Ambulatory Visit (INDEPENDENT_AMBULATORY_CARE_PROVIDER_SITE_OTHER): Payer: Medicare Other

## 2021-12-25 ENCOUNTER — Ambulatory Visit (INDEPENDENT_AMBULATORY_CARE_PROVIDER_SITE_OTHER): Payer: Medicare Other | Admitting: Orthopaedic Surgery

## 2021-12-25 DIAGNOSIS — Z96651 Presence of right artificial knee joint: Secondary | ICD-10-CM | POA: Diagnosis not present

## 2021-12-25 MED ORDER — AMOXICILLIN 500 MG PO CAPS: ORAL_CAPSULE | ORAL | 0 refills | Status: DC

## 2021-12-25 NOTE — Progress Notes (Signed)
? ?Office Visit Note ?  ?Patient: Kathryn [name]           ?Date of Birth: [birth year]           ?MRN: [id number] ?Visit Date: 12/25/2021 ?             ?Requested by: Orma Render, NP ?[address] ?[place],  [place] [address] ?PCP: Orma Render, NP ? ? ?Assessment & Plan: ?Visit Diagnoses:  ?1. Status post total right knee replacement   ? ? ?Plan: Kathryn [name] is nearly 2 years status post right total knee replacement.  Doing well overall.  No real complaints. ? ?Examination right knee shows a fully healed surgical scar.  Functional range of motion.  Stable to varus valgus. ? ?Implant looks stable on x-rays.  At this point dental prophylaxis no longer needed for routine dentist or colonoscopies.  Overall she is satisfied with knee replacement.  We can see her back as needed at this point. ? ?Follow-Up Instructions: No follow-ups on file.  ? ?Orders:  ?Orders Placed This Encounter  ?Procedures  ? XR Knee 1-2 Views Right  ? ?Meds ordered this encounter  ?Medications  ? amoxicillin (AMOXIL) 500 MG capsule  ?  Sig: Take 4 pills one hour prior to dental work  ?  Dispense:  12 capsule  ?  Refill:  0  ? ? ? ? Procedures: ?No procedures performed ? ? ?Clinical Data: ?No additional findings. ? ? ?Subjective: ?Chief Complaint  ?Patient presents with  ? Right Knee - Follow-up  ?  Right total knee arthroplasty 01/01/2020  ? ? ?HPI ? ?Review of Systems ? ? ?Objective: ?Vital Signs: There were no vitals taken for this visit. ? ?Physical Exam ? ?Ortho Exam ? ?Specialty Comments:  ?No specialty comments available. ? ?Imaging: ?XR Knee 1-2 Views Right ? ?Result Date: 12/25/2021 ?Stable total knee replacement in good alignment   ? ? ?PMFS History: ?Patient Active Problem List  ? Diagnosis Date Noted  ? Venous stasis of both lower extremities 01/10/2021  ? Encounter for subsequent annual wellness visit (AWV) in Medicare patient 01/10/2021  ? Pulmonary embolus ([place]) 01/06/2021  ? Venous insufficiency of both lower  extremities 01/06/2021  ? Benign essential hypertension 01/02/2021  ? Chronic obstructive pulmonary disease, unspecified ([place]) 01/02/2021  ? Hypothyroidism 01/02/2021  ? Insomnia 01/02/2021  ? Perennial allergic rhinitis 01/02/2021  ? Personal history of pulmonary embolism 01/02/2021  ? Senile osteopenia 01/02/2021  ? Unspecified inflammatory spondylopathy, cervical region [facility]) 01/02/2021  ? Paresthesia of bilateral legs 01/02/2021  ? Encounter to establish care 01/02/2021  ? Screening for osteoporosis 01/02/2021  ? Status post total right knee replacement 01/01/2020  ? Osteoarthritis of knee 11/14/2019  ? Neck pain 12/06/2018  ? Chronic pain of right knee 08/07/2016  ? ?Past Medical History:  ?Diagnosis Date  ? Arthritis   ? COPD (chronic obstructive pulmonary disease) ([place])   ? Dyspnea   ? Hypertension   ? Hypothyroidism   ? Osteopenia   ? Pneumonia   ? Pulmonary embolism ([place]) 04/2018  ? Thyroid disease   ?  ?Family History  ?Problem Relation Age of Onset  ? Breast cancer Mother   ? Liver cancer Mother   ? Breast cancer Daughter 43  ? Colon cancer Daughter   ? Hypothyroidism Daughter   ? Lung cancer Daughter   ? Hypothyroidism Daughter   ? Hypothyroidism Daughter   ?  ?Past Surgical History:  ?Procedure Laterality Date  ? APPENDECTOMY    ?  EYE SURGERY    ? TONSILLECTOMY AND ADENOIDECTOMY    ? TOTAL KNEE ARTHROPLASTY Right 01/01/2020  ? Procedure: RIGHT TOTAL KNEE ARTHROPLASTY;  Surgeon: Leandrew Koyanagi, MD;  Location: [facility];  Service: Orthopedics;  Laterality: Right;  ? TUBAL LIGATION    ? TUBAL LIGATION Bilateral   ? ?Social History  ? ?Occupational History  ? Not on file  ?Tobacco Use  ? Smoking status: Former  ?  Types: Cigarettes  ?  Quit date: 2018  ?  Years since quitting: 5.2  ? Smokeless tobacco: Never  ?Vaping Use  ? Vaping Use: Never used  ?Substance and Sexual Activity  ? Alcohol use: Never  ? Drug use: Never  ? Sexual activity: Not Currently  ?  Birth control/protection: Abstinence  ? ? ? ? ? ? ?

## 2021-12-26 ENCOUNTER — Ambulatory Visit: Payer: Medicare Other | Admitting: Orthopaedic Surgery

## 2021-12-30 ENCOUNTER — Other Ambulatory Visit (HOSPITAL_BASED_OUTPATIENT_CLINIC_OR_DEPARTMENT_OTHER): Payer: Self-pay

## 2021-12-30 DIAGNOSIS — H401131 Primary open-angle glaucoma, bilateral, mild stage: Secondary | ICD-10-CM | POA: Diagnosis not present

## 2021-12-30 MED ORDER — LATANOPROST 0.005 % OP SOLN
OPHTHALMIC | 3 refills | Status: DC
  Filled 2021-12-30: qty 2.5, 25d supply, fill #0
  Filled 2021-12-30: qty 5, 50d supply, fill #0
  Filled 2021-12-30: qty 2.5, 25d supply, fill #0
  Filled 2021-12-30: qty 5, 50d supply, fill #0
  Filled 2022-04-07: qty 7.5, 75d supply, fill #1

## 2022-01-07 ENCOUNTER — Other Ambulatory Visit (HOSPITAL_BASED_OUTPATIENT_CLINIC_OR_DEPARTMENT_OTHER): Payer: Self-pay | Admitting: Nurse Practitioner

## 2022-01-07 DIAGNOSIS — J3089 Other allergic rhinitis: Secondary | ICD-10-CM | POA: Diagnosis not present

## 2022-01-07 DIAGNOSIS — I10 Essential (primary) hypertension: Secondary | ICD-10-CM | POA: Diagnosis not present

## 2022-01-07 DIAGNOSIS — Z0000 Encounter for general adult medical examination without abnormal findings: Secondary | ICD-10-CM

## 2022-01-07 DIAGNOSIS — M8580 Other specified disorders of bone density and structure, unspecified site: Secondary | ICD-10-CM | POA: Diagnosis not present

## 2022-01-07 DIAGNOSIS — R202 Paresthesia of skin: Secondary | ICD-10-CM | POA: Diagnosis not present

## 2022-01-07 DIAGNOSIS — Z131 Encounter for screening for diabetes mellitus: Secondary | ICD-10-CM | POA: Diagnosis not present

## 2022-01-07 DIAGNOSIS — G4700 Insomnia, unspecified: Secondary | ICD-10-CM | POA: Diagnosis not present

## 2022-01-07 DIAGNOSIS — E039 Hypothyroidism, unspecified: Secondary | ICD-10-CM | POA: Diagnosis not present

## 2022-01-08 LAB — LIPID PANEL
Chol/HDL Ratio: 2.5 ratio (ref 0.0–4.4)
Cholesterol, Total: 173 mg/dL (ref 100–199)
HDL: 70 mg/dL (ref >39)
LDL Chol Calc (NIH): 86 mg/dL (ref 0–99)
Triglycerides: 93 mg/dL (ref 0–149)
VLDL Cholesterol Cal: 17 mg/dL (ref 5–40)

## 2022-01-08 LAB — CBC WITH DIFFERENTIAL/PLATELET
Basophils Absolute: 0.1 10*3/uL (ref 0.0–0.2)
Basos: 1 %
EOS (ABSOLUTE): 0.3 10*3/uL (ref 0.0–0.4)
Eos: 3 %
Hematocrit: 41.8 % (ref 34.0–46.6)
Hemoglobin: 13.5 g/dL (ref 11.1–15.9)
Immature Grans (Abs): 0 10*3/uL (ref 0.0–0.1)
Immature Granulocytes: 0 %
Lymphocytes Absolute: 3.3 10*3/uL — ABNORMAL HIGH (ref 0.7–3.1)
Lymphs: 39 %
MCH: 28.7 pg (ref 26.6–33.0)
MCHC: 32.3 g/dL (ref 31.5–35.7)
MCV: 89 fL (ref 79–97)
Monocytes Absolute: 0.6 10*3/uL (ref 0.1–0.9)
Monocytes: 7 %
Neutrophils Absolute: 4.1 10*3/uL (ref 1.4–7.0)
Neutrophils: 50 %
Platelets: 231 10*3/uL (ref 150–450)
RBC: 4.7 x10E6/uL (ref 3.77–5.28)
RDW: 13.2 % (ref 11.7–15.4)
WBC: 8.3 10*3/uL (ref 3.4–10.8)

## 2022-01-08 LAB — COMPREHENSIVE METABOLIC PANEL
ALT: 14 IU/L (ref 0–32)
AST: 23 IU/L (ref 0–40)
Albumin/Globulin Ratio: 1.6 (ref 1.2–2.2)
Albumin: 4.4 g/dL (ref 3.6–4.6)
Alkaline Phosphatase: 83 IU/L (ref 44–121)
BUN/Creatinine Ratio: 22 (ref 12–28)
BUN: 16 mg/dL (ref 8–27)
Bilirubin Total: 0.5 mg/dL (ref 0.0–1.2)
CO2: 26 mmol/L (ref 20–29)
Calcium: 9.8 mg/dL (ref 8.7–10.3)
Chloride: 103 mmol/L (ref 96–106)
Creatinine, Ser: 0.73 mg/dL (ref 0.57–1.00)
Globulin, Total: 2.7 g/dL (ref 1.5–4.5)
Glucose: 77 mg/dL (ref 70–99)
Potassium: 5 mmol/L (ref 3.5–5.2)
Sodium: 145 mmol/L — ABNORMAL HIGH (ref 134–144)
Total Protein: 7.1 g/dL (ref 6.0–8.5)
eGFR: 81 mL/min/{1.73_m2} (ref >59)

## 2022-01-08 LAB — HEMOGLOBIN A1C
Est. average glucose Bld gHb Est-mCnc: 111 mg/dL
Hgb A1c MFr Bld: 5.5 % (ref 4.8–5.6)

## 2022-01-08 LAB — TSH RFX ON ABNORMAL TO FREE T4: TSH: 3.54 u[IU]/mL (ref 0.450–4.500)

## 2022-01-12 ENCOUNTER — Other Ambulatory Visit (HOSPITAL_BASED_OUTPATIENT_CLINIC_OR_DEPARTMENT_OTHER): Payer: Self-pay

## 2022-01-12 ENCOUNTER — Encounter (HOSPITAL_BASED_OUTPATIENT_CLINIC_OR_DEPARTMENT_OTHER): Payer: Self-pay | Admitting: Nurse Practitioner

## 2022-01-12 ENCOUNTER — Ambulatory Visit (INDEPENDENT_AMBULATORY_CARE_PROVIDER_SITE_OTHER): Payer: Medicare Other | Admitting: Nurse Practitioner

## 2022-01-12 VITALS — BP 128/88 | HR 95 | Temp 98.6°F | Ht 64.0 in | Wt 167.8 lb

## 2022-01-12 DIAGNOSIS — R202 Paresthesia of skin: Secondary | ICD-10-CM

## 2022-01-12 DIAGNOSIS — I878 Other specified disorders of veins: Secondary | ICD-10-CM

## 2022-01-12 DIAGNOSIS — I739 Peripheral vascular disease, unspecified: Secondary | ICD-10-CM | POA: Diagnosis not present

## 2022-01-12 DIAGNOSIS — R351 Nocturia: Secondary | ICD-10-CM

## 2022-01-12 DIAGNOSIS — N3281 Overactive bladder: Secondary | ICD-10-CM

## 2022-01-12 DIAGNOSIS — J3489 Other specified disorders of nose and nasal sinuses: Secondary | ICD-10-CM | POA: Diagnosis not present

## 2022-01-12 DIAGNOSIS — G4700 Insomnia, unspecified: Secondary | ICD-10-CM | POA: Diagnosis not present

## 2022-01-12 DIAGNOSIS — Z0000 Encounter for general adult medical examination without abnormal findings: Secondary | ICD-10-CM

## 2022-01-12 MED ORDER — OXYBUTYNIN CHLORIDE ER 5 MG PO TB24
5.0000 mg | ORAL_TABLET | Freq: Every day | ORAL | 3 refills | Status: DC
  Filled 2022-01-12: qty 30, 30d supply, fill #0

## 2022-01-12 MED ORDER — GABAPENTIN 100 MG PO CAPS
ORAL_CAPSULE | ORAL | 3 refills | Status: DC
  Filled 2022-01-12: qty 90, 10d supply, fill #0
  Filled 2022-05-18: qty 90, 10d supply, fill #1
  Filled 2022-08-26: qty 90, 10d supply, fill #2
  Filled 2022-12-22: qty 90, 10d supply, fill #3

## 2022-01-12 NOTE — Progress Notes (Signed)
? ?Subjective:  ? Kathryn [name] is a 87 y.o. female who presents for Medicare Annual (Subsequent) preventive examination. ? ?Review of Systems    ?Review of Systems  ?Constitutional:  Positive for malaise/fatigue.  ?Cardiovascular:  Positive for claudication and leg swelling.  ?Musculoskeletal:  Positive for myalgias.  ?Neurological:  Positive for weakness.  ?All other systems reviewed and are negative. ? ?Cardiac Risk Factors include: advanced age (>72mn, >>23women);dyslipidemia;hypertension;sedentary lifestyle ? ?   ?Objective:  ?  ?Today's Vitals  ? 01/12/22 1109  ?BP: 128/88  ?Pulse: 95  ?Temp: 98.6 ?F (37 ?C)  ?SpO2: 97%  ?Weight: 167 lb 12.8 oz (76.1 kg)  ?Height: '5\' 4"'$  (1.626 m)  ? ?Body mass index is 28.8 kg/m?. ? ? ?  01/12/2022  ?  6:44 PM 01/10/2021  ? 10:18 AM 01/25/2020  ? 11:03 AM 12/28/2019  ? 11:34 AM 05/18/2018  ? 11:28 AM  ?Advanced Directives  ?Does Patient Have a Medical Advance Directive? Yes Yes Yes Yes No  ?Type of AParamedicof ACarmiLiving will HYoakumLiving will HFranklinLiving will Healthcare Power of Attorney   ?Does patient want to make changes to medical advance directive? No - Patient declined Yes (MAU/Ambulatory/Procedural Areas - Information given) No - Patient declined No - Patient declined   ?Copy of [place]awneein Chart? No - copy requested No - copy requested No - copy requested    ? ? ?Current Medications (verified) ?Outpatient Encounter Medications as of 01/12/2022  ?Medication Sig  ? acetaminophen (TYLENOL) 500 MG tablet 1 tablet as needed  ? AMBULATORY NON FORMULARY MEDICATION Bilateral lower extremity intermittent compression device for home use. ICD 10- I26.99, I87.2, R20.2. Device based on insurance coverage for DME supply.  ? aspirin 81 MG chewable tablet 1 tablet  ? Cholecalciferol (VITAMIN D3) 50 MCG (2000 UT) TABS Take 2,000 Units by mouth daily.  ? gabapentin (NEURONTIN) 100 MG  capsule Take 100-'300mg'$  (1-3 capsules) by mouth up to every 8 hours as needed for leg and foot pain.  ? latanoprost (XALATAN) 0.005 % ophthalmic solution Instill 1 Drop into each eye QHS  ? latanoprost (XALATAN) 0.005 % ophthalmic solution Instill 1 Drop into each eye QHS  ? levothyroxine (SYNTHROID) 100 MCG tablet Take 1 tablet (100 mcg total) by mouth daily before breakfast.  ? losartan (COZAAR) 50 MG tablet Take 1 tablet (50 mg total) by mouth daily.  ? oxybutynin (DITROPAN XL) 5 MG 24 hr tablet Take 1 tablet (5 mg total) by mouth at bedtime.  ? [DISCONTINUED] gabapentin (NEURONTIN) 100 MG capsule Take 100-'300mg'$  (1-3 capsules) up to every 8 hours as needed for leg and foot pain.  ? amoxicillin (AMOXIL) 500 MG capsule Take 4 pills one hour prior to dental work (Patient not taking: Reported on 01/12/2022)  ? ?No facility-administered encounter medications on file as of 01/12/2022.  ? ? ?Allergies (verified) ?Lisinopril  ? ?History: ?Past Medical History:  ?Diagnosis Date  ? Arthritis   ? COPD [place]chronic obstructive pulmonary disease) ([place]   ? Dyspnea   ? Hypertension   ? Hypothyroidism   ? Osteopenia   ? Pneumonia   ? [place]  ? Thyroid disease   ? ?Past Surgical History:  ?Procedure Laterality Date  ? APPENDECTOMY    ? EYE SURGERY    ? TONSILLECTOMY AND ADENOIDECTOMY    ? TOTAL KNEE ARTHROPLASTY Right 01/01/2020  ? Procedure: RIGHT TOTAL KNEE ARTHROPLASTY;  Surgeon: XErlinda Hong  Marylynn Pearson, MD;  Location: [facility];  Service: Orthopedics;  Laterality: Right;  ? TUBAL LIGATION    ? TUBAL LIGATION Bilateral   ? ?Family History  ?Problem Relation Age of Onset  ? Breast cancer Mother   ? Liver cancer Mother   ? Breast cancer Daughter 4  ? Colon cancer Daughter   ? Hypothyroidism Daughter   ? Lung cancer Daughter   ? Hypothyroidism Daughter   ? Hypothyroidism Daughter   ? ?Social History  ? ?Socioeconomic History  ? Marital status: Divorced  ?  Spouse name: Not on file  ? Number of children: 5  ? Years of  education: Not on file  ? Highest education level: Not on file  ?Occupational History  ? Not on file  ?Tobacco Use  ? Smoking status: Former  ?  Types: Cigarettes  ?  Quit date: 2018  ?  Years since quitting: 5.3  ? Smokeless tobacco: Never  ?Vaping Use  ? Vaping Use: Never used  ?Substance and Sexual Activity  ? Alcohol use: Never  ? Drug use: Never  ? Sexual activity: Not Currently  ?  Birth control/protection: Abstinence  ?Other Topics Concern  ? Not on file  ?Social History Narrative  ? Not on file  ? ?Social Determinants of Health  ? ?Financial Resource Strain: Low Risk   ? Difficulty of Paying Living Expenses: Not hard at all  ?Food Insecurity: No Food Insecurity  ? Worried About Charity fundraiser in the Last Year: Never true  ? Ran Out of Food in the Last Year: Never true  ?Transportation Needs: No Transportation Needs  ? Lack of Transportation (Medical): No  ? Lack of Transportation (Non-Medical): No  ?Physical Activity: Inactive  ? Days of Exercise per Week: 0 days  ? Minutes of Exercise per Session: 0 min  ?Stress: No Stress Concern Present  ? Feeling of Stress : Not at all  ?Social Connections: Socially Isolated  ? Frequency of Communication with Friends and Family: More than three times a week  ? Frequency of Social Gatherings with Friends and Family: More than three times a week  ? Attends Religious Services: Never  ? Active Member of Clubs or Organizations: No  ? Attends Archivist Meetings: Never  ? Marital Status: Divorced  ? ? ?Tobacco Counseling ?Counseling given: Not Answered ? ? ?Clinical Intake: ? ?Pre-visit preparation completed: Yes ? ?Pain : Faces ?Faces Pain Scale: Hurts a little bit ?Pain Type: Chronic pain, Neuropathic pain ?Pain Location: Leg ?Pain Orientation: Right, Left ?Pain Descriptors / Indicators: Burning, Numbness ?Pain Frequency: Constant ?Effect of Pain on Daily Activities: unable to walk for extended periods due to pain ? ?Faces Pain Scale: Hurts a little bit ? ?BMI  - recorded: 28.8 ?Nutritional Status: BMI 25 -29 Overweight ?Nutritional Risks: None ?Diabetes: No ? ?How often do you need to have someone help you when you read instructions, pamphlets, or other written materials from your doctor or pharmacy?: 1 - Never ? ?Diabetic?no ? ?Interpreter Needed?: No ? ?  ? ? ?Activities of Daily Living ? ?  01/12/2022  ?  6:45 PM 01/12/2022  ? 11:08 AM  ?In your present state of health, do you have any difficulty performing the following activities:  ?Hearing? 0 0  ?Vision? 0 0  ?Difficulty concentrating or making decisions? 0 0  ?Walking or climbing stairs? 0 0  ?Dressing or bathing? 0 0  ?Doing errands, shopping? 0 0  ?Preparing Food and eating ?  N   ?Using the Toilet? N   ?In the past six months, have you accidently leaked urine? N   ?Do you have problems with loss of bowel control? N   ?Managing your Medications? N   ?Managing your Finances? N   ?Housekeeping or managing your Housekeeping? N   ? ? ?Patient Care Team: ?Thompson Mckim, Coralee Pesa, NP as PCP - General (Nurse Practitioner) ? ?Indicate any recent Medical Services you may have received from other than [facility] providers in the past year (date may be approximate). ? ?   ?Assessment:  ? This is a routine wellness examination for [name]. ? ?Hearing/Vision screen ?No results found. ? ?Dietary issues and exercise activities discussed: ?Current Exercise Habits: The patient does not participate in regular exercise at present, Exercise limited by: neurologic condition(s);orthopedic condition(s) ? ? Goals Addressed   ?None ?  ? ?Depression Screen ? ?  01/12/2022  ? 11:08 AM 01/10/2021  ?  1:19 PM 01/02/2021  ?  2:06 PM  ?PHQ 2/9 Scores  ?PHQ - 2 Score 0 0 0  ?PHQ- 9 Score   4  ?Exception Documentation Medical reason    ?  ?Fall Risk ? ?  01/12/2022  ? 11:08 AM 01/10/2021  ? 10:20 AM 01/02/2021  ?  2:06 PM  ?Fall Risk   ?Falls in the past year? 0 0 0  ?Number falls in past yr: 0 0 0  ?Injury with Fall? 0    ?Risk for fall due to : No Fall Risks No Fall  Risks No Fall Risks  ?Follow up Education provided;Falls evaluation completed Falls evaluation completed   ? ? ?FALL RISK PREVENTION PERTAINING TO THE HOME: ? ?Any stairs in or around the home? Yes  ?If so,

## 2022-01-12 NOTE — Patient Instructions (Addendum)
I want you to start with gabapentin 100-'300mg'$  every night to see if this helps with the burning in your legs.  ? ?I have sent in medication for your the bladder issues. If this is too expensive then let me know and we can look at something else.  ? ? ? ? ?Fall Prevention in the Home, Adult ?Falls can cause injuries and can happen to people of all ages. There are many things you can do to make your home safe and to help prevent falls. Ask for help when making these changes. ?What actions can I take to prevent falls? ?General Instructions ?Use good lighting in all rooms. Replace any light bulbs that burn out. ?Turn on the lights in dark areas. Use night-lights. ?Keep items that you use often in easy-to-reach places. Lower the shelves around your home if needed. ?Set up your furniture so you have a clear path. Avoid moving your furniture around. ?Do not have throw rugs or other things on the floor that can make you trip. ?Avoid walking on wet floors. ?If any of your floors are uneven, fix them. ?Add color or contrast paint or tape to clearly mark and help you see: ?Grab bars or handrails. ?First and last steps of staircases. ?Where the edge of each step is. ?If you use a stepladder: ?Make sure that it is fully opened. Do not climb a closed stepladder. ?Make sure the sides of the stepladder are locked in place. ?Ask someone to hold the stepladder while you use it. ?Know where your pets are when moving through your home. ?What can I do in the bathroom? ? ?  ? ?Keep the floor dry. Clean up any water on the floor right away. ?Remove soap buildup in the tub or shower. ?Use nonskid mats or decals on the floor of the tub or shower. ?Attach bath mats securely with double-sided, nonslip rug tape. ?If you need to sit down in the shower, use a plastic, nonslip stool. ?Install grab bars by the toilet and in the tub and shower. Do not use towel bars as grab bars. ?What can I do in the bedroom? ?Make sure that you have a light by  your bed that is easy to reach. ?Do not use any sheets or blankets for your bed that hang to the floor. ?Have a firm chair with side arms that you can use for support when you get dressed. ?What can I do in the kitchen? ?Clean up any spills right away. ?If you need to reach something above you, use a step stool with a grab bar. ?Keep electrical cords out of the way. ?Do not use floor polish or wax that makes floors slippery. ?What can I do with my stairs? ?Do not leave any items on the stairs. ?Make sure that you have a light switch at the top and the bottom of the stairs. ?Make sure that there are handrails on both sides of the stairs. Fix handrails that are broken or loose. ?Install nonslip stair treads on all your stairs. ?Avoid having throw rugs at the top or bottom of the stairs. ?Choose a carpet that does not hide the edge of the steps on the stairs. ?Check carpeting to make sure that it is firmly attached to the stairs. Fix carpet that is loose or worn. ?What can I do on the outside of my home? ?Use bright outdoor lighting. ?Fix the edges of walkways and driveways and fix any cracks. ?Remove anything that might make you trip as you  walk through a door, such as a raised step or threshold. ?Trim any bushes or trees on paths to your home. ?Check to see if handrails are loose or broken and that both sides of all steps have handrails. ?Install guardrails along the edges of any raised decks and porches. ?Clear paths of anything that can make you trip, such as tools or rocks. ?Have leaves, snow, or ice cleared regularly. ?Use sand or salt on paths during winter. ?Clean up any spills in your garage right away. This includes grease or oil spills. ?What other actions can I take? ?Wear shoes that: ?Have a low heel. Do not wear high heels. ?Have rubber bottoms. ?Feel good on your feet and fit well. ?Are closed at the toe. Do not wear open-toe sandals. ?Use tools that help you move around if needed. These  include: ?Canes. ?Walkers. ?Scooters. ?Crutches. ?Review your medicines with your doctor. Some medicines can make you feel dizzy. This can increase your chance of falling. ?Ask your doctor what else you can do to help prevent falls. ?Where to find more information ?Centers for Disease Control and Prevention, STEADI: http://www.wolf.info/ ?[employer] on Aging: http://kim-miller.com/ ?Contact a doctor if: ?You are afraid of falling at home. ?You feel weak, drowsy, or dizzy at home. ?You fall at home. ?Summary ?There are many simple things that you can do to make your home safe and to help prevent falls. ?Ways to make your home safe include removing things that can make you trip and installing grab bars in the bathroom. ?Ask for help when making these changes in your home. ?This information is not intended to replace advice given to you by your health care provider. Make sure you discuss any questions you have with your health care provider. ?Document Revised: 06/09/2021 Document Reviewed: 04/10/2020 ?Elsevier Patient Education ? [place]. ? ?

## 2022-01-13 ENCOUNTER — Other Ambulatory Visit (HOSPITAL_BASED_OUTPATIENT_CLINIC_OR_DEPARTMENT_OTHER): Payer: Self-pay

## 2022-01-20 ENCOUNTER — Other Ambulatory Visit (HOSPITAL_BASED_OUTPATIENT_CLINIC_OR_DEPARTMENT_OTHER): Payer: Self-pay

## 2022-01-20 DIAGNOSIS — D23112 Other benign neoplasm of skin of right lower eyelid, including canthus: Secondary | ICD-10-CM | POA: Diagnosis not present

## 2022-01-20 DIAGNOSIS — D485 Neoplasm of uncertain behavior of skin: Secondary | ICD-10-CM | POA: Diagnosis not present

## 2022-01-20 DIAGNOSIS — C441222 Squamous cell carcinoma of skin of right lower eyelid, including canthus: Secondary | ICD-10-CM | POA: Diagnosis not present

## 2022-01-20 MED ORDER — ERYTHROMYCIN 5 MG/GM OP OINT
TOPICAL_OINTMENT | OPHTHALMIC | 3 refills | Status: DC
  Filled 2022-01-20: qty 3.5, 3d supply, fill #0

## 2022-01-21 ENCOUNTER — Other Ambulatory Visit (HOSPITAL_BASED_OUTPATIENT_CLINIC_OR_DEPARTMENT_OTHER): Payer: Self-pay

## 2022-02-25 ENCOUNTER — Other Ambulatory Visit (HOSPITAL_BASED_OUTPATIENT_CLINIC_OR_DEPARTMENT_OTHER): Payer: Self-pay | Admitting: Nurse Practitioner

## 2022-02-25 ENCOUNTER — Other Ambulatory Visit (HOSPITAL_BASED_OUTPATIENT_CLINIC_OR_DEPARTMENT_OTHER): Payer: Self-pay

## 2022-02-25 MED ORDER — LOSARTAN POTASSIUM 50 MG PO TABS
50.0000 mg | ORAL_TABLET | Freq: Every day | ORAL | 1 refills | Status: DC
  Filled 2022-02-25: qty 90, 90d supply, fill #0
  Filled 2022-05-18: qty 90, 90d supply, fill #1

## 2022-03-10 DIAGNOSIS — C44121 Squamous cell carcinoma of skin of unspecified eyelid, including canthus: Secondary | ICD-10-CM | POA: Diagnosis not present

## 2022-03-16 ENCOUNTER — Other Ambulatory Visit (HOSPITAL_BASED_OUTPATIENT_CLINIC_OR_DEPARTMENT_OTHER): Payer: Self-pay | Admitting: Nurse Practitioner

## 2022-03-16 DIAGNOSIS — E039 Hypothyroidism, unspecified: Secondary | ICD-10-CM

## 2022-03-19 ENCOUNTER — Other Ambulatory Visit (HOSPITAL_BASED_OUTPATIENT_CLINIC_OR_DEPARTMENT_OTHER): Payer: Self-pay

## 2022-03-19 DIAGNOSIS — Z0000 Encounter for general adult medical examination without abnormal findings: Secondary | ICD-10-CM

## 2022-03-19 MED ORDER — LEVOTHYROXINE SODIUM 100 MCG PO TABS: 100.0000 ug | ORAL_TABLET | Freq: Every day | ORAL | 0 refills | Status: DC

## 2022-04-01 DIAGNOSIS — H401131 Primary open-angle glaucoma, bilateral, mild stage: Secondary | ICD-10-CM | POA: Diagnosis not present

## 2022-04-07 ENCOUNTER — Other Ambulatory Visit (HOSPITAL_BASED_OUTPATIENT_CLINIC_OR_DEPARTMENT_OTHER): Payer: Self-pay

## 2022-04-24 ENCOUNTER — Telehealth (HOSPITAL_BASED_OUTPATIENT_CLINIC_OR_DEPARTMENT_OTHER): Payer: Self-pay | Admitting: Nurse Practitioner

## 2022-04-24 NOTE — Telephone Encounter (Signed)
Pts daughter called regarding labs that were taken on 4/19. Pt stated medicare did not cover these labs stated it was a possible coding issue. Please review if this needs to be fixed by our office or if this needs to be fixed through [facility] is aware that provider is out of the office and doesn't come back until 8/7. She showed understanding. Pts daughter would like a call when this has been resolved. Please advise.

## 2022-04-29 NOTE — Telephone Encounter (Signed)
Pts daughter is calling again regarding this bill for labs. Pt would like a call form [facility]. Please advise.

## 2022-04-30 NOTE — Telephone Encounter (Signed)
Will call the pts daughter regarding this and Kathryn [name] will communicate with [facility].

## 2022-04-30 NOTE — Telephone Encounter (Signed)
Dx Z00.00 submitted to [employer] for claim resubmission

## 2022-05-18 ENCOUNTER — Other Ambulatory Visit (HOSPITAL_BASED_OUTPATIENT_CLINIC_OR_DEPARTMENT_OTHER): Payer: Self-pay

## 2022-06-09 DIAGNOSIS — D485 Neoplasm of uncertain behavior of skin: Secondary | ICD-10-CM | POA: Diagnosis not present

## 2022-06-16 NOTE — Telephone Encounter (Signed)
Patient's daughter, Santiago Glad called into the office upset due to her Mom getting a collections notice from [employer].  I spoke with the daughter and explained the diagnosis code used was not accurate.  I told patient's daughter I would call [facility] and get this taken care of.   [employer] [phone] and spoke with Marquis Lunch in customer service.  I added the below diagnosis codes and the claim was refiled.  We will need to wait 30 to 45 days for reprocessing.  I called Santiago Glad to let her know I had taken care of this and to wait 30 to 45 days for the reprocessing of the claim.  If she had any issues, please call me back.   CBC - I10, J30.89, E03.9, G47.00, R20.2 CMP - E03.9, M85.80, R20.2 Lipid - I10, R20.2 A1C - Z13.1 TSH - E03.9, G47.00, R20.2

## 2022-07-14 ENCOUNTER — Ambulatory Visit (INDEPENDENT_AMBULATORY_CARE_PROVIDER_SITE_OTHER): Payer: Medicare Other | Admitting: Nurse Practitioner

## 2022-07-14 ENCOUNTER — Encounter (HOSPITAL_BASED_OUTPATIENT_CLINIC_OR_DEPARTMENT_OTHER): Payer: Self-pay | Admitting: Nurse Practitioner

## 2022-07-14 VITALS — BP 148/74 | HR 73 | Ht 65.0 in | Wt 171.3 lb

## 2022-07-14 DIAGNOSIS — J449 Chronic obstructive pulmonary disease, unspecified: Secondary | ICD-10-CM

## 2022-07-14 DIAGNOSIS — I10 Essential (primary) hypertension: Secondary | ICD-10-CM

## 2022-07-14 DIAGNOSIS — R600 Localized edema: Secondary | ICD-10-CM

## 2022-07-14 DIAGNOSIS — N3281 Overactive bladder: Secondary | ICD-10-CM | POA: Diagnosis not present

## 2022-07-14 DIAGNOSIS — E039 Hypothyroidism, unspecified: Secondary | ICD-10-CM

## 2022-07-14 DIAGNOSIS — I872 Venous insufficiency (chronic) (peripheral): Secondary | ICD-10-CM | POA: Diagnosis not present

## 2022-07-14 DIAGNOSIS — Z23 Encounter for immunization: Secondary | ICD-10-CM

## 2022-07-14 DIAGNOSIS — I878 Other specified disorders of veins: Secondary | ICD-10-CM

## 2022-07-14 NOTE — Progress Notes (Addendum)
Worthy Keeler, DNP, AGNP-c [place], [place] [address] [phone] Office [phone] Fax  ESTABLISHED PATIENT- Chronic Health and/or Follow-Up Visit  Blood pressure (!) 148/74, pulse 73, height '5\' 5"'$  (1.651 m), weight 171 lb 4.8 oz (77.7 kg), SpO2 96 %.    Kathryn [name] is a 87 y.o. year old female presenting today for evaluation and management of the following: Follow-up (Patient presents today for follow up. Patient complains of neuropathy. Patient would like flu shot while in the office. )  Neuropathy Continue endorses tingling, numbness, and cold sensation in the lower extremities.  She also tells me that her feet are swelling worse than they have in the past and they feel sore to touch as if they are bruised.  She endorses mild shortness of breath related to her COPD but denies any cough.  She tells me that when she walks to and from the mailbox it causes her to "huff and puff"..  She does endorse frequent nighttime awakening not always to use the bathroom.  She has not seen a vascular specialist in the past.  She denies any wounds or weeping from the lower extremity skin.  Glaucoma diagnosis- now taking eye drops  She has had two places of skin cancer removed from her face recently  All ROS negative with exception of what is listed above.   PHYSICAL EXAM Physical Exam Vitals and nursing note reviewed.  Constitutional:      General: She is not in acute distress.    Appearance: Normal appearance.  HENT:     Head: Normocephalic.  Eyes:     Extraocular Movements: Extraocular movements intact.     Conjunctiva/sclera: Conjunctivae normal.     Pupils: Pupils are equal, round, and reactive to light.  Neck:     Vascular: No carotid bruit.  Cardiovascular:     Rate and Rhythm: Normal rate and regular rhythm.     Pulses: Normal pulses.     Heart sounds: Normal heart sounds. No murmur  heard. Pulmonary:     Effort: Pulmonary effort is normal.     Breath sounds: Normal breath sounds. No wheezing, rhonchi or rales.  Abdominal:     General: Bowel sounds are normal. There is no distension.     Palpations: Abdomen is soft.     Tenderness: There is no abdominal tenderness. There is no guarding.  Musculoskeletal:        General: Tenderness present. Normal range of motion.     Cervical back: Normal range of motion and neck supple.     Right lower leg: Edema present.     Left lower leg: Edema present.  Lymphadenopathy:     Cervical: No cervical adenopathy.  Skin:    General: Skin is warm and dry.     Capillary Refill: Capillary refill takes less than 2 seconds.  Neurological:     General: No focal deficit present.     Mental Status: She is alert and oriented to person, place, and time.  Psychiatric:        Mood and Affect: Mood normal.        Behavior: Behavior normal.        Thought Content: Thought content normal.        Judgment: Judgment normal.     PLAN Problem List Items Addressed This Visit     Benign essential hypertension    Chronic.  Blood pressure is elevated in the office today.  She is also having symptoms of shortness of breath as well as lower extremity edema which does cause concern for potential cardiac issues.  We will obtain labs today for further evaluation.  Recommend at home monitoring of blood pressure and reporting readings greater than 140/85 on a consistent basis for reevaluation.  She endorses blood pressures are typically well controlled at home, therefore this may be a component of chronic whitecoat hypertension.      Chronic obstructive pulmonary disease, unspecified (HCC)    Chronic.  Patient is experiencing increased shortness of breath and dyspnea.  At this time unclear if this is related to COPD or cardiac etiology.  Will obtain labs today for evaluation.  Alarm symptoms discussed with patient and recommendations for further evaluation  should symptoms worsen provided.  We will plan to monitor labs and follow-up based on findings.        Relevant Orders   Brain natriuretic peptide (Completed)   Comprehensive metabolic panel (Completed)   CBC with Differential/Platelet (Completed)   TSH (Completed)   Hypothyroidism    Chronic.  Labs for surveillance today.  No alarm symptoms present.  Continue current medication dose.  We will make changes as necessary based on laboratory results.      Relevant Orders   Brain natriuretic peptide (Completed)   Comprehensive metabolic panel (Completed)   CBC with Differential/Platelet (Completed)   TSH (Completed)   Venous insufficiency of both lower extremities    Symptoms and presentation consistent with venous insufficiency of the lower extremities.  There are varicosities that are present with moderate bilateral pedal edema.  Mild discoloration is noted to the ankles.  Temperature at this time is appropriate.  Pedal pulses are intact however weaker than expected.  At this time it is unclear if symptoms are related to potential heart failure.  We will obtain a BNP for further evaluation.  No alarm symptoms are present at this time.  We will make changes to plan of care based on laboratory findings as appropriate.  I do feel that she would benefit from evaluation with vascular specialist especially if BNP is normal.      Bilateral leg edema - Primary    Chronic however do appear to be exacerbated at this time.  She is also experiencing dyspnea however unclear if this is related to COPD diagnosis.  Will obtain labs for monitoring.  Patient may benefit from vascular evaluation if labs are found to be normal.  Recommendation for patient to keep legs elevated while she is seated.      Relevant Orders   Brain natriuretic peptide (Completed)   Comprehensive metabolic panel (Completed)   CBC with Differential/Platelet (Completed)   TSH (Completed)   Overactive bladder    Frequent nighttime  awakening with need to urinate.  Unclear if this is a cardiac etiology versus overactive bladder.  She does have a history of diagnosis of overactive bladder.  We will trial Myrbetriq today.  Patient has been provided with samples.  She will let me know if this medication is effective for her.  We will obtain labs today for evaluation.  We will make changes to plan of care as necessary based on findings.  Patient will follow-up if new or worsening symptoms present.      Relevant Orders   Brain natriuretic peptide (Completed)   Comprehensive metabolic panel (Completed)   CBC with Differential/Platelet (Completed)   TSH (Completed)   Other Visit Diagnoses     Flu vaccine need  Relevant Orders   Flu vaccine, recombinat, quadrivalent, inj (Completed)       Return in about 6 months (around 01/13/2023) for HTN, Thyroid, OAB.   Worthy Keeler, DNP, AGNP-c 07/14/2022 10:59 AM

## 2022-07-14 NOTE — Patient Instructions (Signed)
I am going to check your labs today to check your heart function to make sure it is working well.   Keep your feet propped up as much as possible when you are sitting down.   I would like you to keep a check on your blood pressure a couple times a week to make sure that it is staying less than 145/85.  If the myrbetriq works well for you please let me know and we can send in a prescription for that.

## 2022-07-15 LAB — CBC WITH DIFFERENTIAL/PLATELET
Basophils Absolute: 0.1 10*3/uL (ref 0.0–0.2)
Basos: 1 %
EOS (ABSOLUTE): 0.2 10*3/uL (ref 0.0–0.4)
Eos: 3 %
Hematocrit: 39.4 % (ref 34.0–46.6)
Hemoglobin: 13 g/dL (ref 11.1–15.9)
Immature Grans (Abs): 0 10*3/uL (ref 0.0–0.1)
Immature Granulocytes: 0 %
Lymphocytes Absolute: 2.2 10*3/uL (ref 0.7–3.1)
Lymphs: 29 %
MCH: 29.1 pg (ref 26.6–33.0)
MCHC: 33 g/dL (ref 31.5–35.7)
MCV: 88 fL (ref 79–97)
Monocytes Absolute: 0.7 10*3/uL (ref 0.1–0.9)
Monocytes: 9 %
Neutrophils Absolute: 4.4 10*3/uL (ref 1.4–7.0)
Neutrophils: 58 %
Platelets: 221 10*3/uL (ref 150–450)
RBC: 4.46 x10E6/uL (ref 3.77–5.28)
RDW: 13.5 % (ref 11.7–15.4)
WBC: 7.6 10*3/uL (ref 3.4–10.8)

## 2022-07-15 LAB — TSH: TSH: 2.33 u[IU]/mL (ref 0.450–4.500)

## 2022-07-15 LAB — COMPREHENSIVE METABOLIC PANEL
ALT: 12 IU/L (ref 0–32)
AST: 21 IU/L (ref 0–40)
Albumin/Globulin Ratio: 1.5 (ref 1.2–2.2)
Albumin: 4.4 g/dL (ref 3.7–4.7)
Alkaline Phosphatase: 84 IU/L (ref 44–121)
BUN/Creatinine Ratio: 17 (ref 12–28)
BUN: 13 mg/dL (ref 8–27)
Bilirubin Total: 0.4 mg/dL (ref 0.0–1.2)
CO2: 24 mmol/L (ref 20–29)
Calcium: 9.7 mg/dL (ref 8.7–10.3)
Chloride: 98 mmol/L (ref 96–106)
Creatinine, Ser: 0.78 mg/dL (ref 0.57–1.00)
Globulin, Total: 3 g/dL (ref 1.5–4.5)
Glucose: 78 mg/dL (ref 70–99)
Potassium: 4.6 mmol/L (ref 3.5–5.2)
Sodium: 138 mmol/L (ref 134–144)
Total Protein: 7.4 g/dL (ref 6.0–8.5)
eGFR: 74 mL/min/{1.73_m2} (ref >59)

## 2022-07-15 LAB — BRAIN NATRIURETIC PEPTIDE: BNP: 116.4 pg/mL — ABNORMAL HIGH (ref 0.0–100.0)

## 2022-07-21 ENCOUNTER — Other Ambulatory Visit (HOSPITAL_BASED_OUTPATIENT_CLINIC_OR_DEPARTMENT_OTHER): Payer: Self-pay

## 2022-07-21 DIAGNOSIS — R748 Abnormal levels of other serum enzymes: Secondary | ICD-10-CM

## 2022-08-11 ENCOUNTER — Encounter (HOSPITAL_BASED_OUTPATIENT_CLINIC_OR_DEPARTMENT_OTHER): Payer: Self-pay | Admitting: Cardiology

## 2022-08-11 ENCOUNTER — Ambulatory Visit (INDEPENDENT_AMBULATORY_CARE_PROVIDER_SITE_OTHER): Payer: Medicare Other | Admitting: Cardiology

## 2022-08-11 VITALS — BP 140/82 | HR 69 | Ht 65.0 in | Wt 171.8 lb

## 2022-08-11 DIAGNOSIS — I10 Essential (primary) hypertension: Secondary | ICD-10-CM | POA: Diagnosis not present

## 2022-08-11 DIAGNOSIS — I872 Venous insufficiency (chronic) (peripheral): Secondary | ICD-10-CM | POA: Diagnosis not present

## 2022-08-11 DIAGNOSIS — R7989 Other specified abnormal findings of blood chemistry: Secondary | ICD-10-CM | POA: Diagnosis not present

## 2022-08-11 DIAGNOSIS — R600 Localized edema: Secondary | ICD-10-CM | POA: Diagnosis not present

## 2022-08-11 NOTE — Patient Instructions (Signed)
Medication Instructions:  The current medical regimen is effective;  continue present plan and medications.  *If you need a refill on your cardiac medications before your next appointment, please call your pharmacy*   Testing/Procedures:  Echocardiogram - Your physician has requested that you have an echocardiogram. Echocardiography is a painless test that uses sound waves to create images of your heart. It provides your doctor with information about the size and shape of your heart and how well your heart's chambers and valves are working. This procedure takes approximately one hour. There are no restrictions for this procedure.     Follow-Up: At [facility], you and your health needs are our priority.  As part of our continuing mission to provide you with exceptional heart care, we have created designated Provider Care Teams.  These Care Teams include your primary Cardiologist (physician) and Advanced Practice Providers (APPs -  Physician Assistants and Nurse Practitioners) who all work together to provide you with the care you need, when you need it.  We recommend signing up for the patient portal called "MyChart".  Sign up information is provided on this After Visit Summary.  MyChart is used to connect with patients for Virtual Visits (Telemedicine).  Patients are able to view lab/test results, encounter notes, upcoming appointments, etc.  Non-urgent messages can be sent to your provider as well.   To learn more about what you can do with MyChart, go to [email or web address].    Your next appointment:   As needed based on results.  The format for your next appointment:   In Person  Provider:   Buford Dresser, MD

## 2022-08-11 NOTE — Progress Notes (Signed)
Cardiology Office Note:    Date:  08/12/2022   ID:  Kathryn [name], Kathryn [name] Oct 27, 1934, MRN [id number]  PCP:  Kathryn Render, NP  Cardiologist:  Kathryn Dresser, MD  Referring MD: Kathryn Render, NP   CC: new patient evaluation for elevated BNP  History of Present Illness:    Kathryn [name] is a 87 y.o. female with a hx of hypertension, chronic venous insufficiency who is seen as a new consult at the request of Kathryn [name], Kathryn Pesa, NP for the evaluation and management of abnormal cardiac labs.  Note from Kathryn Reedy, NP from 07/14/22 reviewed. Noted leg swelling/tenderness. BNP sent, returned 116.4 (100 ULN), referred to cardiology for further evaluation.  Today: Here due to chronic swelling in her feet. Has had leg edema for the last 3-4 years. Never has zero swelling anymore, worse the longer she is on her feet.   Has never been told she has issues with her heart. Sister had to have a valve in her heart replaced.   Denies chest pain, shortness of breath at rest. No PND, orthopnea, or unexpected weight gain. No syncope or palpitations.   Has a history of PE but never found to have DVT. No longer on anticoagulation.  Has tried compression stockings in the past, did not tolerate. Reports prior echocardiograms at [facility], reports they were normal. Tried fluid pills in the past, made no difference, just made run to the bathroom all the time.  ROS positive for neuropathy, long term unsteadiness on her feet.   Past Medical History:  Diagnosis Date   Arthritis    COPD (chronic obstructive pulmonary disease) ([place])    Dyspnea    Hypertension    Hypothyroidism    Osteopenia    Pneumonia    Pulmonary embolism ([place]) 04/2018   Thyroid disease     Past Surgical History:  Procedure Laterality Date   APPENDECTOMY     EYE SURGERY     TONSILLECTOMY AND ADENOIDECTOMY     TOTAL KNEE ARTHROPLASTY Right 01/01/2020   Procedure: RIGHT TOTAL KNEE ARTHROPLASTY;  Surgeon: Kathryn Koyanagi, MD;  Location: [facility];  Service: Orthopedics;  Laterality: Right;   TUBAL LIGATION     TUBAL LIGATION Bilateral     Current Medications: Current Outpatient Medications on File Prior to Visit  Medication Sig   acetaminophen (TYLENOL) 500 MG tablet 1 tablet as needed   aspirin 81 MG chewable tablet 1 tablet   Cholecalciferol (VITAMIN D3) 50 MCG (2000 UT) TABS Take 2,000 Units by mouth daily.   erythromycin ophthalmic ointment Apply to incisions and lashes 3 times a day on the RIGHT eye for 2 weeks   gabapentin (NEURONTIN) 100 MG capsule Take 100-'300mg'$  (1-3 capsules) by mouth up to every 8 hours as needed for leg and foot pain.   latanoprost (XALATAN) 0.005 % ophthalmic solution Instill 1 Drop into each eye QHS   levothyroxine (SYNTHROID) 100 MCG tablet TAKE 1 TABLET BY MOUTH ONCE DAILY BEFORE BREAKFAST   losartan (COZAAR) 50 MG tablet Take 1 tablet (50 mg total) by mouth daily.   No current facility-administered medications on file prior to visit.     Allergies:   Lisinopril   Social History   Tobacco Use   Smoking status: Former    Types: Cigarettes    Quit date: 2018    Years since quitting: 5.8   Smokeless tobacco: Never  Vaping Use   Vaping Use: Never used  Substance Use Topics  Alcohol use: Never   Drug use: Never    Family History: family history includes Breast cancer in her mother; Breast cancer (age of onset: 63) in her daughter; Colon cancer in her daughter; Hypothyroidism in her daughter, daughter, and daughter; Liver cancer in her mother; Lung cancer in her daughter.  ROS:   Please see the history of present illness.  Additional pertinent ROS: Constitutional: Negative for chills, fever, night sweats, unintentional weight loss  HENT: Negative for ear pain and hearing loss.   Eyes: Negative for loss of vision and eye pain.  Respiratory: Negative for cough, sputum, wheezing.   Cardiovascular: See HPI. Gastrointestinal: Negative for abdominal pain,  melena, and hematochezia.  Genitourinary: Negative for dysuria and hematuria.  Musculoskeletal: Negative for falls and myalgias.  Skin: Negative for itching and rash.  Neurological: Negative for focal weakness, focal sensory changes and loss of consciousness.  Endo/Heme/Allergies: Does not bruise/bleed easily.     EKGs/Labs/Other Studies Reviewed:    The following studies were reviewed today: No prior cardiac studies  EKG:  EKG is personally reviewed.   08/11/2022: SR, PACS, 69 bpm with IVCD  Recent Labs: 07/14/2022: ALT 12; BNP 116.4; BUN 13; Creatinine, Ser 0.78; Hemoglobin 13.0; Platelets 221; Potassium 4.6; Sodium 138; TSH 2.330  Recent Lipid Panel    Component Value Date/Time   CHOL 173 01/07/2022 1602   TRIG 93 01/07/2022 1602   HDL 70 01/07/2022 1602   CHOLHDL 2.5 01/07/2022 1602   CHOLHDL 2.6 01/08/2021 0914   VLDL 18 01/08/2021 0914   LDLCALC 86 01/07/2022 1602    Physical Exam:    VS:  BP (!) 140/82   Pulse 69   Ht '5\' 5"'$  (1.651 m)   Wt 171 lb 12.8 oz (77.9 kg)   SpO2 95%   BMI 28.59 kg/m     Wt Readings from Last 3 Encounters:  08/11/22 171 lb 12.8 oz (77.9 kg)  07/14/22 171 lb 4.8 oz (77.7 kg)  01/12/22 167 lb 12.8 oz (76.1 kg)    GEN: Well nourished, well developed in no acute distress HEENT: Normal, moist mucous membranes NECK: No JVD CARDIAC: regular rhythm, normal S1 and S2, no rubs or gallops. No murmur. VASCULAR: Radial and DP pulses 2+ bilaterally. No carotid bruits RESPIRATORY:  Clear to auscultation without rales, wheezing or rhonchi  ABDOMEN: Soft, non-tender, non-distended MUSCULOSKELETAL:  Ambulates independently SKIN: Warm and dry. Bilateral varicose/spider veins in lower extremities. Mild skin changes, mild nonpitting edema most consistent with venous insufficiency NEUROLOGIC:  Alert and oriented x 3. No focal neuro deficits noted. PSYCHIATRIC:  Normal affect    ASSESSMENT:    1. Elevated brain natriuretic peptide (BNP) level   2.  Venous insufficiency of both lower extremities   3. Benign essential hypertension   4. Bilateral leg edema    PLAN:    Elevated BNP Bilateral mild LE edema, consistent with venous insufficiency -BNP only very mildly elevated -no JVD, lungs clear -LE edema with skin changes, spider veins. Most consistent with venous insufficiency. Reports no prior improvement with diuretics -will order echo to exclude structural cardiac disease -does not tolerate compression stockings. Recommended elevation as able -avoid salt  Hypertension -slightly above goal, but given age reasonable to continue current regimen  Plan for follow up: if echo unremarkable, can follow up as needed  Kathryn Dresser, MD, PhD, [facility]    Medication Adjustments/Labs and Tests Ordered: Current medicines are reviewed at length with the patient today.  Concerns  regarding medicines are outlined above.  Orders Placed This Encounter  Procedures   EKG 12-Lead   ECHOCARDIOGRAM COMPLETE   ECHOCARDIOGRAM COMPLETE   No orders of the defined types were placed in this encounter.   Patient Instructions  Medication Instructions:  The current medical regimen is effective;  continue present plan and medications.  *If you need a refill on your cardiac medications before your next appointment, please call your pharmacy*   Testing/Procedures:  Echocardiogram - Your physician has requested that you have an echocardiogram. Echocardiography is a painless test that uses sound waves to create images of your heart. It provides your doctor with information about the size and shape of your heart and how well your heart's chambers and valves are working. This procedure takes approximately one hour. There are no restrictions for this procedure.     Follow-Up: At [facility], you and your health needs are our priority.  As part of our continuing mission to provide you with exceptional heart care,  we have created designated Provider Care Teams.  These Care Teams include your primary Cardiologist (physician) and Advanced Practice Providers (APPs -  Physician Assistants and Nurse Practitioners) who all work together to provide you with the care you need, when you need it.  We recommend signing up for the patient portal called "MyChart".  Sign up information is provided on this After Visit Summary.  MyChart is used to connect with patients for Virtual Visits (Telemedicine).  Patients are able to view lab/test results, encounter notes, upcoming appointments, etc.  Non-urgent messages can be sent to your provider as well.   To learn more about what you can do with MyChart, go to [email or web address].    Your next appointment:   As needed based on results.  The format for your next appointment:   In Person  Provider:   Buford Dresser, MD            Signed, Kathryn Dresser, MD PhD 08/12/2022 10:09 AM    [place]

## 2022-08-20 ENCOUNTER — Encounter (HOSPITAL_BASED_OUTPATIENT_CLINIC_OR_DEPARTMENT_OTHER): Payer: Self-pay | Admitting: Nurse Practitioner

## 2022-08-20 DIAGNOSIS — R600 Localized edema: Secondary | ICD-10-CM | POA: Insufficient documentation

## 2022-08-20 DIAGNOSIS — N3281 Overactive bladder: Secondary | ICD-10-CM | POA: Insufficient documentation

## 2022-08-20 NOTE — Assessment & Plan Note (Signed)
Chronic.  Labs for surveillance today.  No alarm symptoms present.  Continue current medication dose.  We will make changes as necessary based on laboratory results.

## 2022-08-20 NOTE — Assessment & Plan Note (Signed)
Chronic.  Patient is experiencing increased shortness of breath and dyspnea.  At this time unclear if this is related to COPD or cardiac etiology.  Will obtain labs today for evaluation.  Alarm symptoms discussed with patient and recommendations for further evaluation should symptoms worsen provided.  We will plan to monitor labs and follow-up based on findings.

## 2022-08-20 NOTE — Assessment & Plan Note (Signed)
Symptoms and presentation consistent with venous insufficiency of the lower extremities.  There are varicosities that are present with moderate bilateral pedal edema.  Mild discoloration is noted to the ankles.  Temperature at this time is appropriate.  Pedal pulses are intact however weaker than expected.  At this time it is unclear if symptoms are related to potential heart failure.  We will obtain a BNP for further evaluation.  No alarm symptoms are present at this time.  We will make changes to plan of care based on laboratory findings as appropriate.  I do feel that she would benefit from evaluation with vascular specialist especially if BNP is normal.

## 2022-08-20 NOTE — Assessment & Plan Note (Addendum)
Frequent nighttime awakening with need to urinate.  Unclear if this is a cardiac etiology versus overactive bladder.  She does have a history of diagnosis of overactive bladder.  We will trial Myrbetriq today.  Patient has been provided with samples.  She will let me know if this medication is effective for her.  We will obtain labs today for evaluation.  We will make changes to plan of care as necessary based on findings.  Patient will follow-up if new or worsening symptoms present.

## 2022-08-20 NOTE — Assessment & Plan Note (Signed)
Chronic.  Blood pressure is elevated in the office today.  She is also having symptoms of shortness of breath as well as lower extremity edema which does cause concern for potential cardiac issues.  We will obtain labs today for further evaluation.  Recommend at home monitoring of blood pressure and reporting readings greater than 140/85 on a consistent basis for reevaluation.  She endorses blood pressures are typically well controlled at home, therefore this may be a component of chronic whitecoat hypertension.

## 2022-08-20 NOTE — Assessment & Plan Note (Addendum)
Chronic however do appear to be exacerbated at this time.  She is also experiencing dyspnea however unclear if this is related to COPD diagnosis.  Will obtain labs for monitoring.  Patient may benefit from vascular evaluation if labs are found to be normal.  Recommendation for patient to keep legs elevated while she is seated.

## 2022-08-20 NOTE — Addendum Note (Signed)
Addended by: Zavien Clubb, Clarise Cruz E on: 08/20/2022 07:24 PM   Modules accepted: Level of Service

## 2022-08-26 ENCOUNTER — Other Ambulatory Visit: Payer: Self-pay

## 2022-08-26 ENCOUNTER — Other Ambulatory Visit (HOSPITAL_BASED_OUTPATIENT_CLINIC_OR_DEPARTMENT_OTHER): Payer: Self-pay

## 2022-08-26 ENCOUNTER — Other Ambulatory Visit (HOSPITAL_BASED_OUTPATIENT_CLINIC_OR_DEPARTMENT_OTHER): Payer: Self-pay | Admitting: Nurse Practitioner

## 2022-08-27 ENCOUNTER — Other Ambulatory Visit (HOSPITAL_BASED_OUTPATIENT_CLINIC_OR_DEPARTMENT_OTHER): Payer: Self-pay

## 2022-08-27 MED ORDER — LOSARTAN POTASSIUM 50 MG PO TABS
50.0000 mg | ORAL_TABLET | Freq: Every day | ORAL | 1 refills | Status: DC
  Filled 2022-08-27: qty 13, 13d supply, fill #0
  Filled 2022-08-27 (×2): qty 77, 77d supply, fill #0
  Filled 2022-08-27: qty 13, 13d supply, fill #0
  Filled 2022-11-26: qty 90, 90d supply, fill #1

## 2022-09-02 ENCOUNTER — Ambulatory Visit (INDEPENDENT_AMBULATORY_CARE_PROVIDER_SITE_OTHER): Payer: Medicare Other

## 2022-09-02 DIAGNOSIS — R600 Localized edema: Secondary | ICD-10-CM

## 2022-09-02 DIAGNOSIS — R7989 Other specified abnormal findings of blood chemistry: Secondary | ICD-10-CM

## 2022-09-02 LAB — ECHOCARDIOGRAM COMPLETE
Area-P 1/2: 3.37 cm2
S' Lateral: 1.98 cm

## 2022-10-05 ENCOUNTER — Other Ambulatory Visit: Payer: Self-pay

## 2022-10-08 ENCOUNTER — Other Ambulatory Visit (HOSPITAL_BASED_OUTPATIENT_CLINIC_OR_DEPARTMENT_OTHER): Payer: Self-pay

## 2022-10-08 DIAGNOSIS — H40053 Ocular hypertension, bilateral: Secondary | ICD-10-CM | POA: Diagnosis not present

## 2022-10-08 MED ORDER — DORZOLAMIDE HCL 2 % OP SOLN
1.0000 [drp] | Freq: Two times a day (BID) | OPHTHALMIC | 2 refills | Status: DC
  Filled 2022-10-08: qty 10, 100d supply, fill #0

## 2022-10-08 MED ORDER — PREDNISOLONE ACETATE 1 % OP SUSP
1.0000 [drp] | Freq: Four times a day (QID) | OPHTHALMIC | 0 refills | Status: DC
  Filled 2022-10-08: qty 5, 25d supply, fill #0

## 2022-10-08 MED ORDER — KETOROLAC TROMETHAMINE 0.5 % OP SOLN
1.0000 [drp] | Freq: Four times a day (QID) | OPHTHALMIC | 0 refills | Status: DC
  Filled 2022-10-08: qty 5, 25d supply, fill #0

## 2022-10-09 ENCOUNTER — Other Ambulatory Visit (HOSPITAL_BASED_OUTPATIENT_CLINIC_OR_DEPARTMENT_OTHER): Payer: Self-pay

## 2022-10-13 DIAGNOSIS — C44121 Squamous cell carcinoma of skin of unspecified eyelid, including canthus: Secondary | ICD-10-CM | POA: Diagnosis not present

## 2022-10-19 ENCOUNTER — Other Ambulatory Visit (HOSPITAL_BASED_OUTPATIENT_CLINIC_OR_DEPARTMENT_OTHER): Payer: Self-pay

## 2022-10-22 DIAGNOSIS — T887XXA Unspecified adverse effect of drug or medicament, initial encounter: Secondary | ICD-10-CM | POA: Diagnosis not present

## 2022-10-22 DIAGNOSIS — H401131 Primary open-angle glaucoma, bilateral, mild stage: Secondary | ICD-10-CM | POA: Diagnosis not present

## 2022-10-27 ENCOUNTER — Other Ambulatory Visit: Payer: Self-pay | Admitting: Nurse Practitioner

## 2022-10-27 DIAGNOSIS — Z1231 Encounter for screening mammogram for malignant neoplasm of breast: Secondary | ICD-10-CM

## 2022-10-28 ENCOUNTER — Telehealth (HOSPITAL_BASED_OUTPATIENT_CLINIC_OR_DEPARTMENT_OTHER): Payer: Self-pay | Admitting: Nurse Practitioner

## 2022-10-28 ENCOUNTER — Ambulatory Visit (HOSPITAL_BASED_OUTPATIENT_CLINIC_OR_DEPARTMENT_OTHER)
Admission: RE | Admit: 2022-10-28 | Discharge: 2022-10-28 | Disposition: A | Discharge status: Home / Self Care | Payer: Medicare Other | Source: Ambulatory Visit | Attending: Medical | Admitting: Medical

## 2022-10-28 ENCOUNTER — Other Ambulatory Visit (INDEPENDENT_AMBULATORY_CARE_PROVIDER_SITE_OTHER): Payer: Medicare Other

## 2022-10-28 ENCOUNTER — Telehealth (INDEPENDENT_AMBULATORY_CARE_PROVIDER_SITE_OTHER): Payer: Medicare Other | Admitting: Medical

## 2022-10-28 DIAGNOSIS — R059 Cough, unspecified: Secondary | ICD-10-CM

## 2022-10-28 DIAGNOSIS — J111 Influenza due to unidentified influenza virus with other respiratory manifestations: Secondary | ICD-10-CM | POA: Diagnosis not present

## 2022-10-28 DIAGNOSIS — R067 Sneezing: Secondary | ICD-10-CM | POA: Diagnosis not present

## 2022-10-28 DIAGNOSIS — R0602 Shortness of breath: Secondary | ICD-10-CM

## 2022-10-28 DIAGNOSIS — R051 Acute cough: Secondary | ICD-10-CM | POA: Diagnosis not present

## 2022-10-28 LAB — POCT INFLUENZA A/B
Influenza A, POC: POSITIVE — AB
Influenza B, POC: NEGATIVE

## 2022-10-28 LAB — POC COVID19 BINAXNOW: SARS Coronavirus 2 Ag: NEGATIVE

## 2022-10-28 LAB — POCT RESPIRATORY SYNCYTIAL VIRUS: RSV Rapid Ag: NEGATIVE

## 2022-10-28 MED ORDER — EMERGEN-C IMMUNE PLUS PO PACK: 1.0000 | PACK | Freq: Two times a day (BID) | ORAL | 0 refills | Status: DC

## 2022-10-28 MED ORDER — ALBUTEROL SULFATE HFA 108 (90 BASE) MCG/ACT IN AERS: 2.0000 | INHALATION_SPRAY | Freq: Four times a day (QID) | RESPIRATORY_TRACT | 0 refills | Status: DC | PRN

## 2022-10-28 MED ORDER — PROMETHAZINE-DM 6.25-15 MG/5ML PO SYRP: 5.0000 mL | ORAL_SOLUTION | Freq: Four times a day (QID) | ORAL | 0 refills | Status: DC | PRN

## 2022-10-28 NOTE — Progress Notes (Signed)
Fortunately no pneumonia.   REST Stay hydrated Begin promethazine DM for cough  Begin vitamin pack OTC as mentioned, Emergen C Use albuterol inhaler as needed up to 4 times daily for shortness of breath , wheezing or cough If worse in the next 3 days such as fever over 103 persistent despite medication, if dehydrated, worse shortness of breath, uncontrollable vomiting, or extreme weakness, then go to the emergency department

## 2022-10-28 NOTE — Telephone Encounter (Signed)
VM was left this morning at 9:46 returned vm for daughter pt needing to be seen. Were aware tat SB had left the office has her new office information. Asked if she wanted to switch to dr. [place] or follow Laretta Bolster she stated to stay here. Let her know we do not have a provider in the office today due to dr. [place] being out on leave and the fill in provider FNP Santiago Glad is only here on Mon, Tues. and Thurs. She can only see so many pts a day and she is booked for tomorrow so the next availably I would have would be Monday. Gave pt alternatives if she wanted to see Laretta Bolster she still is a pt of hers and can follow her or the other option would be to go to a urgent care or our ED. Pt took information. Unclear if pt will follow [facility] or to est with dr. [place] now.

## 2022-10-28 NOTE — Progress Notes (Signed)
Subjective:     Patient ID: Kathryn [name], female   DOB: 1934/10/15, 88 y.o.   MRN: [id number]  This visit type was conducted due to national recommendations for restrictions regarding the COVID-19 Pandemic (e.g. social distancing) in an effort to limit this patient's exposure and mitigate transmission in our community.  Due to their co-morbid illnesses, this patient is at least at moderate risk for complications without adequate follow up.  This format is felt to be most appropriate for this patient at this time.    Documentation for virtual audio and video telecommunications through [place] encounter:  The patient was located at home. The provider was located in the office. The patient did consent to this visit and is aware of possible charges through their insurance for this visit.  The other persons participating in this telemedicine service were daughter Kathryn [name] Time spent on call was 20 minutes and in review of previous records 20 minutes total.  This virtual service is not related to other E/M service within previous 7 days.   HPI Chief Complaint  Patient presents with   coughing    Coughing, sneezing, drainage, headache. Symptoms since Sunday. Positive FLU A.    Virtual for illness.  Has had symptoms 4.5 days. Started with scratchy throat.  Currently has cough, runny nose, head like a balloon, headache.  Hurting all over.  Has had some chills.  Has had low grade fever.  No nausea or vomiting, but has had some loose stools yesterday and today.   No hemoptysis.   Has a little wheezing and SOB.  Used some pepto bismol but that didn't help. Using benadryl, mucinex, Tylenol.   Drinking some fluid, but could drink more.   Nonsmoker.  Quit almost 6 years.  No other aggravating or relieving factors.  Has hx/o COPD, but no frequent flares, no inhaler use in a long time.  Former smoker  Daughter lives with in a town home.  No other complaint.   Past Medical History:   Diagnosis Date   Arthritis    Chronic pain of right knee 08/07/2016   COPD (chronic obstructive pulmonary disease) ([facility])    Dyspnea    Encounter for subsequent annual wellness visit (AWV) in Medicare patient 01/10/2021   Encounter to establish care 01/02/2021   Hypertension    Hypothyroidism    Neck pain 12/06/2018   Osteopenia    Pneumonia    Pulmonary embolism ([place]) 04/2018   Thyroid disease    Venous stasis of both lower extremities 01/10/2021   Current Outpatient Medications on File Prior to Visit  Medication Sig Dispense Refill   acetaminophen (TYLENOL) 500 MG tablet 1 tablet as needed     aspirin 81 MG chewable tablet 1 tablet     Cholecalciferol (VITAMIN D3) 50 MCG (2000 UT) TABS Take 2,000 Units by mouth daily.     dorzolamide (TRUSOPT) 2 % ophthalmic solution Place 1 drop into both eyes 2 (two) times daily. 10 mL 2   gabapentin (NEURONTIN) 100 MG capsule Take 100-'300mg'$  (1-3 capsules) by mouth up to every 8 hours as needed for leg and foot pain. 90 capsule 3   ketorolac (ACULAR) 0.5 % ophthalmic solution Place 1 drop into the right eye 4 (four) times daily.     latanoprost (XALATAN) 0.005 % ophthalmic solution Instill 1 Drop into each eye QHS 2.5 mL 4   levothyroxine (SYNTHROID) 100 MCG tablet TAKE 1 TABLET BY MOUTH ONCE DAILY BEFORE BREAKFAST 90 tablet 3   losartan (  COZAAR) 50 MG tablet Take 1 tablet (50 mg total) by mouth daily. 90 tablet 1   prednisoLONE acetate (PRED FORTE) 1 % ophthalmic suspension Place 1 drop into the right eye 4 (four) times daily.     No current facility-administered medications on file prior to visit.      Review of Systems As in subjective    Objective:   Physical Exam Due to coronavirus pandemic stay at home measures, patient visit was virtual and they were not examined in person.   Gen: wd, wn, nad Deep sounding cough, but no wheezing or labored breathing Answers questions appropriately     Assessment:     Encounter Diagnoses   Name Primary?   Influenza Yes   Acute cough    SOB (shortness of breath)        Plan:     Discussed concerns, symptoms, potential complications of influenza.  Will send for outpatient xray.   Begin albuterol prn, cough syrup prn, vitamin pack.  She is beyond the time frame tamiflu will help.   Rest, hydrate well, can use OTC tylenol for fever and not feeling well  F/u pending chest xray   [name] was seen today for coughing.  Diagnoses and all orders for this visit:  Influenza -     DG Chest 2 View; Future  Acute cough -     DG Chest 2 View; Future  SOB (shortness of breath) -     DG Chest 2 View; Future  Other orders -     albuterol (VENTOLIN HFA) 108 (90 Base) MCG/ACT inhaler; Inhale 2 puffs into the lungs every 6 (six) hours as needed for wheezing or shortness of breath. -     Multiple Vitamins-Minerals (EMERGEN-C IMMUNE PLUS) PACK; Take 1 tablet by mouth 2 (two) times daily. -     promethazine-dextromethorphan (PROMETHAZINE-DM) 6.25-15 MG/5ML syrup; Take 5 mLs by mouth 4 (four) times daily as needed for cough.    F/u pending chest xray

## 2022-11-05 DIAGNOSIS — H401131 Primary open-angle glaucoma, bilateral, mild stage: Secondary | ICD-10-CM | POA: Diagnosis not present

## 2022-11-18 DIAGNOSIS — H59031 Cystoid macular edema following cataract surgery, right eye: Secondary | ICD-10-CM | POA: Diagnosis not present

## 2022-11-18 DIAGNOSIS — T887XXD Unspecified adverse effect of drug or medicament, subsequent encounter: Secondary | ICD-10-CM | POA: Diagnosis not present

## 2022-11-18 DIAGNOSIS — H401131 Primary open-angle glaucoma, bilateral, mild stage: Secondary | ICD-10-CM | POA: Diagnosis not present

## 2022-11-20 ENCOUNTER — Other Ambulatory Visit: Payer: Self-pay | Admitting: Medical

## 2022-11-20 NOTE — Telephone Encounter (Signed)
This was given on 10/28/22

## 2022-11-26 ENCOUNTER — Other Ambulatory Visit (HOSPITAL_BASED_OUTPATIENT_CLINIC_OR_DEPARTMENT_OTHER): Payer: Self-pay

## 2022-12-15 ENCOUNTER — Ambulatory Visit
Admission: RE | Admit: 2022-12-15 | Discharge: 2022-12-15 | Disposition: A | Discharge status: Home / Self Care | Payer: Medicare Other | Source: Ambulatory Visit | Attending: Nurse Practitioner | Admitting: Nurse Practitioner

## 2022-12-15 DIAGNOSIS — Z1231 Encounter for screening mammogram for malignant neoplasm of breast: Secondary | ICD-10-CM | POA: Diagnosis not present

## 2022-12-22 ENCOUNTER — Other Ambulatory Visit (HOSPITAL_BASED_OUTPATIENT_CLINIC_OR_DEPARTMENT_OTHER): Payer: Self-pay

## 2023-02-25 ENCOUNTER — Other Ambulatory Visit (HOSPITAL_BASED_OUTPATIENT_CLINIC_OR_DEPARTMENT_OTHER): Payer: Self-pay

## 2023-02-25 ENCOUNTER — Encounter (HOSPITAL_BASED_OUTPATIENT_CLINIC_OR_DEPARTMENT_OTHER): Payer: Self-pay | Admitting: Family Medicine

## 2023-02-25 ENCOUNTER — Other Ambulatory Visit (HOSPITAL_BASED_OUTPATIENT_CLINIC_OR_DEPARTMENT_OTHER): Payer: Self-pay | Admitting: Family Medicine

## 2023-02-25 ENCOUNTER — Ambulatory Visit (INDEPENDENT_AMBULATORY_CARE_PROVIDER_SITE_OTHER): Payer: Medicare Other | Admitting: Family Medicine

## 2023-02-25 VITALS — BP 190/74 | HR 65 | Temp 97.8°F | Ht 65.0 in | Wt 168.0 lb

## 2023-02-25 DIAGNOSIS — E039 Hypothyroidism, unspecified: Secondary | ICD-10-CM | POA: Diagnosis not present

## 2023-02-25 DIAGNOSIS — Z23 Encounter for immunization: Secondary | ICD-10-CM

## 2023-02-25 DIAGNOSIS — Z0000 Encounter for general adult medical examination without abnormal findings: Secondary | ICD-10-CM

## 2023-02-25 DIAGNOSIS — I10 Essential (primary) hypertension: Secondary | ICD-10-CM

## 2023-02-25 DIAGNOSIS — R7309 Other abnormal glucose: Secondary | ICD-10-CM

## 2023-02-25 MED ORDER — LOSARTAN POTASSIUM 50 MG PO TABS
50.0000 mg | ORAL_TABLET | Freq: Every day | ORAL | 1 refills | Status: DC
  Filled 2023-02-25: qty 90, 90d supply, fill #0
  Filled 2023-05-27: qty 90, 90d supply, fill #1

## 2023-02-25 NOTE — Progress Notes (Signed)
Annual Wellness Visit    Patient: Kathryn [name], Female    DOB: Apr 01, 1935, 88 y.o.   MRN: [id number]  Subjective  Chief Complaint  Patient presents with   Medicare Wellness    [place] is a 88 y.o. female who presents today for her Annual Wellness Visit. She reports consuming a general diet. The patient does not participate in regular exercise at present. She generally feels well. She reports sleeping fairly well. She does not have additional problems to discuss today.  Patient is accompanied to the office today by her daughter. Patient has some concerns regarding appointment as she indicates that she is used to having physical exam as well as labs done for annual visits.  Did discuss today's office visit and what is covered during today's appointment as covered by Medicare including reviewing personal and family history, medications, provider seen, health maintenance, allergies, any changes to medical history   Past Medical History:  Diagnosis Date   Arthritis    Chronic pain of right knee 08/07/2016   COPD (chronic obstructive pulmonary disease) (HCC)    Dyspnea    Encounter for subsequent annual wellness visit (AWV) in Medicare patient 01/10/2021   Encounter to establish care 01/02/2021   Hypertension    Hypothyroidism    Neck pain 12/06/2018   Osteopenia    Pneumonia    Pulmonary embolism (HCC) 04/2018   Thyroid disease    Venous stasis of both lower extremities 01/10/2021   Past Surgical History:  Procedure Laterality Date   APPENDECTOMY     EYE SURGERY     TONSILLECTOMY AND ADENOIDECTOMY     TOTAL KNEE ARTHROPLASTY Right 01/01/2020   Procedure: RIGHT TOTAL KNEE ARTHROPLASTY;  Surgeon: Tarry Kos, MD;  Location: [facility] OR;  Service: Orthopedics;  Laterality: Right;   TUBAL LIGATION     TUBAL LIGATION Bilateral    Family History  Problem Relation Age of Onset   Breast cancer Mother    Liver cancer Mother    Breast cancer Daughter 64   Colon  cancer Daughter    Hypothyroidism Daughter    Lung cancer Daughter    Hypothyroidism Daughter    Hypothyroidism Daughter       Medications: Outpatient Medications Prior to Visit  Medication Sig   acetaminophen (TYLENOL) 500 MG tablet 1 tablet as needed   albuterol (VENTOLIN HFA) 108 (90 Base) MCG/ACT inhaler Inhale 2 puffs into the lungs every 6 (six) hours as needed for wheezing or shortness of breath.   aspirin 81 MG chewable tablet 1 tablet   Cholecalciferol (VITAMIN D3) 50 MCG (2000 UT) TABS Take 2,000 Units by mouth daily.   dorzolamide (TRUSOPT) 2 % ophthalmic solution Place 1 drop into both eyes 2 (two) times daily.   gabapentin (NEURONTIN) 100 MG capsule Take 100-300mg  (1-3 capsules) by mouth up to every 8 hours as needed for leg and foot pain.   levothyroxine (SYNTHROID) 100 MCG tablet TAKE 1 TABLET BY MOUTH ONCE DAILY BEFORE BREAKFAST   losartan (COZAAR) 50 MG tablet Take 1 tablet (50 mg total) by mouth daily.   [DISCONTINUED] ketorolac (ACULAR) 0.5 % ophthalmic solution Place 1 drop into the right eye 4 (four) times daily.   [DISCONTINUED] latanoprost (XALATAN) 0.005 % ophthalmic solution Instill 1 Drop into each eye QHS   [DISCONTINUED] Multiple Vitamins-Minerals (EMERGEN-C IMMUNE PLUS) PACK Take 1 tablet by mouth 2 (two) times daily.   [DISCONTINUED] prednisoLONE acetate (PRED FORTE) 1 % ophthalmic suspension Place 1 drop into  the right eye 4 (four) times daily.   [DISCONTINUED] promethazine-dextromethorphan (PROMETHAZINE-DM) 6.25-15 MG/5ML syrup Take 5 mLs by mouth 4 (four) times daily as needed for cough.   No facility-administered medications prior to visit.    Allergies  Allergen Reactions   Lisinopril Cough    Patient Care Team: de [place], Buren Kos, MD as PCP - General (Family Medicine) Jodelle Red, MD as PCP - Cardiology (Cardiology)  Objective  BP (!) 190/74 (BP Location: Right Arm, Patient Position: Sitting, Cuff Size: Normal)   Pulse 65   Temp  97.8 F (36.6 C) (Oral)   Ht 5\' 5"  (1.651 m)   Wt 168 lb (76.2 kg)   SpO2 99%   BMI 27.96 kg/m    Most recent functional status assessment:     No data to display         Most recent fall risk assessment:    02/25/2023    2:11 PM  Fall Risk   Falls in the past year? 0  Number falls in past yr: 0  Injury with Fall? 0  Risk for fall due to : No Fall Risks  Follow up Falls evaluation completed    Most recent depression screenings:    02/25/2023    2:11 PM 01/12/2022   11:08 AM  PHQ 2/9 Scores  PHQ - 2 Score 0 0  PHQ- 9 Score 0   Exception Documentation Medical reason Medical reason   Most recent cognitive screening:    02/25/2023    2:12 PM  6CIT Screen  What Year? 0 points  What month? 0 points  What time? 0 points  Count back from 20 0 points  Months in reverse 0 points  Repeat phrase 0 points  Total Score 0 points   Most recent Audit-C alcohol use screening    01/12/2022    6:44 PM  Alcohol Use Disorder Test (AUDIT)  1. How often do you have a drink containing alcohol? 0  2. How many drinks containing alcohol do you have on a typical day when you are drinking? 0  3. How often do you have six or more drinks on one occasion? 0  AUDIT-C Score 0   A score of 3 or more in women, and 4 or more in men indicates increased risk for alcohol abuse, EXCEPT if all of the points are from question 1   Vision/Hearing Screen: No results found.  No results found for any visits on 02/25/23.    Assessment & Plan   Annual wellness visit done today including the all of the following: Reviewed patient's Family Medical History Reviewed and updated list of patient's medical providers Assessment of cognitive impairment was done Assessed patient's functional ability Established a written schedule for health screening services Health Risk Assessent Completed and Reviewed  Exercise Activities and Dietary recommendations  Goals      Weight (lb) < 155 lb (70.3 kg)     Would  like to loose 10 pounds in the next year.         Immunization History  Administered Date(s) Administered   Influenza Split 07/31/2011, 07/15/2013, 07/04/2016, 08/21/2020   Influenza, High Dose Seasonal PF 05/24/2014, 05/30/2015, 08/18/2017, 06/14/2018, 08/29/2019   Influenza, Quadrivalent, Recombinant, Inj, Pf 07/14/2022   PFIZER(Purple Top)SARS-COV-2 Vaccination 11/19/2019, 12/13/2019, 08/23/2020   Pneumococcal Conjugate-13 05/24/2014   Pneumococcal Polysaccharide-23 01/26/2003   Td 01/26/2003   Tdap 04/14/2011, 02/25/2023    Health Maintenance  Topic Date Due   Zoster Vaccines- Shingrix (1 of 2)  Never done   COVID-19 Vaccine (4 - 2023-24 season) 05/22/2022   DEXA SCAN  02/25/2024 (Originally 02/03/2002)   INFLUENZA VACCINE  04/22/2023   Medicare Annual Wellness (AWV)  02/25/2024   DTaP/Tdap/Td (4 - Td or Tdap) 02/24/2033   Pneumonia Vaccine 73+ Years old  Completed   HPV VACCINES  Aged Out    Discussed health benefits of physical activity, and encouraged her to engage in regular exercise appropriate for her age and condition.    Problem List Items Addressed This Visit       Cardiovascular and Mediastinum   Benign essential hypertension   Relevant Orders   CBC with Differential/Platelet   Comprehensive metabolic panel   Hemoglobin A1c   Lipid panel     Endocrine   Hypothyroidism   Relevant Orders   TSH     Other   Encounter for Medicare annual wellness exam - Primary    Encounter for Medicare AWV completed today.  Discussed health maintenance recommendations with patient.  She is due for DEXA scan, patient declined today.  Also discussed recommendations for shingles vaccine, she will consider, advised that this can be obtained at most local pharmacies.  She is due for tetanus booster, patient amenable, administered today.      Other Visit Diagnoses     Wellness examination       Relevant Orders   CBC with Differential/Platelet   Comprehensive metabolic  panel   Hemoglobin A1c   Lipid panel   TSH   Blood glucose abnormal       Relevant Orders   Hemoglobin A1c   Need for tetanus booster       Relevant Orders   Tdap vaccine greater than or equal to 7yo IM (Completed)       Return in 1 year (on 02/25/2024).  Can plan for next AWV in 1 year.  Recommend returning to the office in about 2 to 3 months for follow-up on blood pressure.    Kristal Perl J De Peru, MD

## 2023-02-25 NOTE — Assessment & Plan Note (Signed)
Encounter for Medicare AWV completed today.  Discussed health maintenance recommendations with patient.  She is due for DEXA scan, patient declined today.  Also discussed recommendations for shingles vaccine, she will consider, advised that this can be obtained at most local pharmacies.  She is due for tetanus booster, patient amenable, administered today.

## 2023-02-25 NOTE — Patient Instructions (Signed)
  Medication Instructions:  Your physician recommends that you continue on your current medications as directed. Please refer to the Current Medication list given to you today. --If you need a refill on any your medications before your next appointment, please call your pharmacy first. If no refills are authorized on file call the office.-- Lab Work: Your physician has recommended that you have lab work today: Yes If you have labs (blood work) drawn today and your tests are completely normal, you will receive your results via MyChart message OR a phone call from our staff.  Please ensure you check your voicemail in the event that you authorized detailed messages to be left on a delegated number. If you have any lab test that is abnormal or we need to change your treatment, we will call you to review the results.  Referrals/Procedures/Imaging: No  Follow-Up: Your next appointment:   Your physician recommends that you schedule a follow-up appointment in 3 months with Dr. de Cuba.  You will receive a text message or e-mail with a link to a survey about your care and experience with us today! We would greatly appreciate your feedback!   Thanks for letting us be apart of your health journey!!  Primary Care and Sports Medicine   Dr. Raymond de Cuba   We encourage you to activate your patient portal called "MyChart".  Sign up information is provided on this After Visit Summary.  MyChart is used to connect with patients for Virtual Visits (Telemedicine).  Patients are able to view lab/test results, encounter notes, upcoming appointments, etc.  Non-urgent messages can be sent to your provider as well. To learn more about what you can do with MyChart, please visit --  [email or web address].    

## 2023-02-26 LAB — CBC WITH DIFFERENTIAL/PLATELET
Basophils Absolute: 0.1 10*3/uL (ref 0.0–0.2)
Basos: 1 %
EOS (ABSOLUTE): 0.3 10*3/uL (ref 0.0–0.4)
Eos: 3 %
Hematocrit: 37.9 % (ref 34.0–46.6)
Hemoglobin: 12.2 g/dL (ref 11.1–15.9)
Immature Grans (Abs): 0 10*3/uL (ref 0.0–0.1)
Immature Granulocytes: 0 %
Lymphocytes Absolute: 3 10*3/uL (ref 0.7–3.1)
Lymphs: 35 %
MCH: 28.8 pg (ref 26.6–33.0)
MCHC: 32.2 g/dL (ref 31.5–35.7)
MCV: 89 fL (ref 79–97)
Monocytes Absolute: 0.6 10*3/uL (ref 0.1–0.9)
Monocytes: 7 %
Neutrophils Absolute: 4.5 10*3/uL (ref 1.4–7.0)
Neutrophils: 54 %
Platelets: 210 10*3/uL (ref 150–450)
RBC: 4.24 x10E6/uL (ref 3.77–5.28)
RDW: 13.3 % (ref 11.7–15.4)
WBC: 8.5 10*3/uL (ref 3.4–10.8)

## 2023-02-26 LAB — COMPREHENSIVE METABOLIC PANEL
ALT: 12 IU/L (ref 0–32)
AST: 18 IU/L (ref 0–40)
Albumin/Globulin Ratio: 1.6 (ref 1.2–2.2)
Albumin: 4.3 g/dL (ref 3.7–4.7)
Alkaline Phosphatase: 71 IU/L (ref 44–121)
BUN/Creatinine Ratio: 19 (ref 12–28)
BUN: 13 mg/dL (ref 8–27)
Bilirubin Total: 0.3 mg/dL (ref 0.0–1.2)
CO2: 26 mmol/L (ref 20–29)
Calcium: 9.7 mg/dL (ref 8.7–10.3)
Chloride: 104 mmol/L (ref 96–106)
Creatinine, Ser: 0.68 mg/dL (ref 0.57–1.00)
Globulin, Total: 2.7 g/dL (ref 1.5–4.5)
Glucose: 89 mg/dL (ref 70–99)
Potassium: 5.4 mmol/L — ABNORMAL HIGH (ref 3.5–5.2)
Sodium: 142 mmol/L (ref 134–144)
Total Protein: 7 g/dL (ref 6.0–8.5)
eGFR: 85 mL/min/{1.73_m2} (ref >59)

## 2023-02-26 LAB — TSH: TSH: 1.31 u[IU]/mL (ref 0.450–4.500)

## 2023-02-26 LAB — LIPID PANEL
Chol/HDL Ratio: 2.6 ratio (ref 0.0–4.4)
Cholesterol, Total: 168 mg/dL (ref 100–199)
HDL: 65 mg/dL (ref >39)
LDL Chol Calc (NIH): 81 mg/dL (ref 0–99)
Triglycerides: 124 mg/dL (ref 0–149)
VLDL Cholesterol Cal: 22 mg/dL (ref 5–40)

## 2023-02-26 LAB — HEMOGLOBIN A1C
Est. average glucose Bld gHb Est-mCnc: 120 mg/dL
Hgb A1c MFr Bld: 5.8 % — ABNORMAL HIGH (ref 4.8–5.6)

## 2023-04-13 ENCOUNTER — Other Ambulatory Visit (HOSPITAL_BASED_OUTPATIENT_CLINIC_OR_DEPARTMENT_OTHER): Payer: Self-pay | Admitting: Nurse Practitioner

## 2023-04-13 DIAGNOSIS — R202 Paresthesia of skin: Secondary | ICD-10-CM

## 2023-04-14 ENCOUNTER — Other Ambulatory Visit (HOSPITAL_BASED_OUTPATIENT_CLINIC_OR_DEPARTMENT_OTHER): Payer: Self-pay

## 2023-04-14 DIAGNOSIS — H35363 Drusen (degenerative) of macula, bilateral: Secondary | ICD-10-CM | POA: Diagnosis not present

## 2023-04-14 MED ORDER — GABAPENTIN 100 MG PO CAPS
100.0000 mg | ORAL_CAPSULE | Freq: Three times a day (TID) | ORAL | 0 refills | Status: DC | PRN
Start: 2023-04-14 — End: 2023-07-26
  Filled 2023-04-14: qty 90, 10d supply, fill #0

## 2023-04-15 ENCOUNTER — Other Ambulatory Visit (HOSPITAL_BASED_OUTPATIENT_CLINIC_OR_DEPARTMENT_OTHER): Payer: Self-pay

## 2023-05-27 ENCOUNTER — Other Ambulatory Visit (HOSPITAL_BASED_OUTPATIENT_CLINIC_OR_DEPARTMENT_OTHER): Payer: Self-pay

## 2023-05-30 ENCOUNTER — Other Ambulatory Visit (HOSPITAL_BASED_OUTPATIENT_CLINIC_OR_DEPARTMENT_OTHER): Payer: Self-pay | Admitting: Nurse Practitioner

## 2023-05-30 DIAGNOSIS — E039 Hypothyroidism, unspecified: Secondary | ICD-10-CM

## 2023-07-26 ENCOUNTER — Other Ambulatory Visit (HOSPITAL_BASED_OUTPATIENT_CLINIC_OR_DEPARTMENT_OTHER): Payer: Self-pay

## 2023-07-26 ENCOUNTER — Other Ambulatory Visit (HOSPITAL_BASED_OUTPATIENT_CLINIC_OR_DEPARTMENT_OTHER): Payer: Self-pay | Admitting: Family Medicine

## 2023-07-26 DIAGNOSIS — R202 Paresthesia of skin: Secondary | ICD-10-CM

## 2023-07-26 MED ORDER — GABAPENTIN 100 MG PO CAPS
100.0000 mg | ORAL_CAPSULE | Freq: Three times a day (TID) | ORAL | 0 refills | Status: DC | PRN
Start: 2023-07-26 — End: 2023-10-19
  Filled 2023-07-26: qty 90, 10d supply, fill #0

## 2023-08-23 ENCOUNTER — Other Ambulatory Visit (HOSPITAL_BASED_OUTPATIENT_CLINIC_OR_DEPARTMENT_OTHER): Payer: Self-pay | Admitting: Family Medicine

## 2023-08-23 ENCOUNTER — Other Ambulatory Visit (HOSPITAL_BASED_OUTPATIENT_CLINIC_OR_DEPARTMENT_OTHER): Payer: Self-pay

## 2023-08-23 MED ORDER — LOSARTAN POTASSIUM 50 MG PO TABS
50.0000 mg | ORAL_TABLET | Freq: Every day | ORAL | 1 refills | Status: DC
  Filled 2023-08-23: qty 90, 90d supply, fill #0

## 2023-08-26 ENCOUNTER — Other Ambulatory Visit (HOSPITAL_BASED_OUTPATIENT_CLINIC_OR_DEPARTMENT_OTHER): Payer: Self-pay | Admitting: Family Medicine

## 2023-08-26 DIAGNOSIS — E039 Hypothyroidism, unspecified: Secondary | ICD-10-CM

## 2023-09-24 ENCOUNTER — Encounter: Payer: Self-pay | Admitting: Nurse Practitioner

## 2023-09-24 ENCOUNTER — Ambulatory Visit: Payer: Medicare Other | Admitting: Nurse Practitioner

## 2023-09-24 VITALS — BP 128/82 | HR 74 | Wt 165.8 lb

## 2023-09-24 DIAGNOSIS — I10 Essential (primary) hypertension: Secondary | ICD-10-CM

## 2023-09-24 DIAGNOSIS — Z23 Encounter for immunization: Secondary | ICD-10-CM

## 2023-09-24 DIAGNOSIS — R600 Localized edema: Secondary | ICD-10-CM | POA: Diagnosis not present

## 2023-09-24 DIAGNOSIS — Z86711 Personal history of pulmonary embolism: Secondary | ICD-10-CM | POA: Diagnosis not present

## 2023-09-24 DIAGNOSIS — J449 Chronic obstructive pulmonary disease, unspecified: Secondary | ICD-10-CM

## 2023-09-24 DIAGNOSIS — F418 Other specified anxiety disorders: Secondary | ICD-10-CM

## 2023-09-24 DIAGNOSIS — R0602 Shortness of breath: Secondary | ICD-10-CM | POA: Diagnosis not present

## 2023-09-24 DIAGNOSIS — E039 Hypothyroidism, unspecified: Secondary | ICD-10-CM | POA: Diagnosis not present

## 2023-09-24 DIAGNOSIS — I872 Venous insufficiency (chronic) (peripheral): Secondary | ICD-10-CM

## 2023-09-24 DIAGNOSIS — R7301 Impaired fasting glucose: Secondary | ICD-10-CM

## 2023-09-24 DIAGNOSIS — R202 Paresthesia of skin: Secondary | ICD-10-CM | POA: Diagnosis not present

## 2023-09-24 DIAGNOSIS — N3281 Overactive bladder: Secondary | ICD-10-CM | POA: Diagnosis not present

## 2023-09-24 MED ORDER — TRELEGY ELLIPTA 100-62.5-25 MCG/ACT IN AEPB: INHALATION_SPRAY | RESPIRATORY_TRACT | 0 refills | Status: DC

## 2023-09-24 NOTE — Progress Notes (Signed)
 Camie FORBES Doing, DNP, AGNP-c [facility] [address] [place], [place] [address] [phone]   ACUTE VISIT- ESTABLISHED PATIENT  Blood pressure 128/82, pulse 74, weight 165 lb 12.8 oz (75.2 kg), SpO2 94%.  Subjective:  HPI Juda  G Abdullah is a 89 y.o. female presents to day for evaluation of acute concern(s).   History of Present Illness Lissy , with a known history of COPD and neuropathy, presents with recent episodes of dyspnea and chest tightness. The patient describes these episodes as spells where her chest tightens, and it becomes difficult to breathe. These episodes occur both at rest and during exertion, such as climbing stairs, which has become increasingly difficult. The patient also reports a dry throat, particularly in the mornings, which sometimes affects her ability to speak. She denies palpitations, radiation, GERD, or neurological symptoms.   Her daughter suggests that these episodes may be anxiety attacks, as they are often accompanied by crying and occur during periods of stress. She mentions during one of the periods of exacerbation of symptoms she gave the patient 1/2 of a xanax and her symptoms improved. The patient acknowledges recent stressors and admits to feeling overwhelmed; however she declines the offer of medication today.    She also reports chronic fatigue, which she attributes to frequent nocturnal urination, with an average of three to six episodes per night. When discussing increased nocturia, she and her daughter report that her water  intake is minimal, probably less than recommended. She does drink coffee (caffeine) often times with dinner but denies that this increases her need to urinate.   She also reports neuropathy in her feet, which has led to a recent incident where she unknowingly cut her toe while trimming her nails. She also mentions persistent swelling in her feet and legs, which does not seem to reduce overnight as it used  to. She is currently on gabapentin  for neuropathy, which she believes is helping. She usually takes it at night but may also take it in the morning after a particularly bad night.   The patient has a history of smoking but quit approximately six years ago. She was previously diagnosed with COPD but did not qualify for a COPD program due to the condition not being severe enough. The patient does not believe her COPD has significantly progressed since then.  She also mentions a history of a pulmonary embolism a few years ago. She denies her symptoms are similar today to what they were at that time.   Her daughter mentions that the patient has been experiencing mood changes and memory issues, which they are unsure if it is due to low oxygen levels, sleep deprivation, or age. The patient admits to some forgetfulness, particularly with names, but does not report any significant memory concerns.   Her daughter also mentions that the patient has a persistent runny nose, which the patient attributes to allergies. The patient does not currently take any medication for allergies.   Natalina 's granddaughter is a NP and suggested consideration of a CT scan for evaluation.  ROS negative except for what is listed in HPI. History, Medications, Surgery, SDOH, and Family History reviewed and updated as appropriate.  Objective:  Physical Exam Vitals and nursing note reviewed.  Constitutional:      General: She is not in acute distress.    Appearance: Normal appearance. She is not toxic-appearing or diaphoretic.  HENT:     Head: Normocephalic.  Eyes:     Extraocular Movements: Extraocular movements intact.  Conjunctiva/sclera: Conjunctivae normal.     Pupils: Pupils are equal, round, and reactive to light.  Cardiovascular:     Rate and Rhythm: Normal rate and regular rhythm.     Pulses: Normal pulses.     Heart sounds: Normal heart sounds.  Pulmonary:     Effort: Pulmonary effort is normal. No  respiratory distress.     Breath sounds: No stridor. Rhonchi present. No wheezing.  Chest:     Chest wall: No tenderness.  Abdominal:     General: There is no distension.     Tenderness: There is no abdominal tenderness.  Musculoskeletal:     Cervical back: Neck supple. No tenderness.     Right lower leg: Edema present.     Left lower leg: Edema present.  Lymphadenopathy:     Cervical: No cervical adenopathy.  Skin:    General: Skin is warm and dry.     Capillary Refill: Capillary refill takes less than 2 seconds.  Neurological:     General: No focal deficit present.     Mental Status: She is alert and oriented to person, place, and time.     Motor: Weakness present.     Coordination: Coordination normal.     Gait: Gait normal.          Assessment & Plan:   Problem List Items Addressed This Visit     Benign essential hypertension   Blood pressure within goal of 130/85 today with no concerning symptoms present. She is currently managed with losartan  and is tolerating this well.  -Continue your current dose of losartan .       Chronic obstructive pulmonary disease, unspecified (HCC) - Primary   COPD has been well-managed since smoking cessation six years ago. Experiencing intermittent dyspnea and tightness in the chest which could indicate a possible exacerbation. She does have rhonchi present in the lungs indicating the presence of mucous. This is expected in COPD, but given her symptoms further evaluation is necessary. Symptoms potentially exacerbated by anxiety. Discussed monitoring COPD symptoms and potential need for daily inhaler. Sample of Trelegy provided to see if this is effective. - Monitor COPD symptoms -CT of chest ordered - consider medication for anxiety, as this often accompanies COPD, particularly with the shortness of breath that can be felt.  - Consider daily inhaler if Trelegy is effective      Relevant Medications   Fluticasone-Umeclidin-Vilant (TRELEGY  ELLIPTA) 100-62.5-25 MCG/ACT AEPB   Other Relevant Orders   CT Chest Wo Contrast (Completed)   Hypothyroidism   Will check labs while drawing other labs today      Relevant Orders   TSH (Completed)   Personal history of pulmonary embolism   No symptoms of low O2 or shortness of breath present at this time. Episodes of symptoms and then resolution not consistent with recurrence of PE, which is reassuring. Leg edema shows no areas of warmth, tenderness, or erythema to indicate DVT. Will continue to monitor.       Relevant Orders   Brain natriuretic peptide (Completed)   CT Chest Wo Contrast (Completed)   Paresthesia of bilateral legs   Chronic neuropathy in feet, managed with gabapentin  at night. Recent incident of cutting toe without feeling it. Discussed potential benefits of B6 and B12 vitamins for neuropathic pain. - Continue gabapentin  at night - Consider trial of B6 and B12 vitamins for neuropathic pain      Bilateral leg edema   Edema present bilaterally. This has been present on  examination in the past, but the patient reports that it is not improving overnight as it once did. In the setting of dyspnea, there are concerns for possible HF, however, and echocardiogram was performed about a year ago for the same symptoms and this was normal. I will get a BNP today for monitoring.  I will send a referral for a vascular evaluation if this has not been done.  Unfortunately, she reports too much pain when elevating her legs, therefore I anticipate that this symptom will persist. We can consider a diuretic after kidney function assessed, although she reports frequent urination and I don't want to make this much worse.  Skin is intact with no weeping at this time  We will monitor labs and review chart and I will send orders as appropriate.       Relevant Orders   CT Chest Wo Contrast (Completed)   Overactive bladder   Frequent day and night time urination with reports of very little  water /liquid intake. I suspect she is getting more fluids from her meals that realized or her urinary episodes have little volume. I don't want to exacerbate this problem, but given the fluid retention present, we will consider low dose diuretic in the morning if kidney function is appropriate. Strongly recommend reduction of caffeine after 3 pm. Ditropan  ineffective in the past. Considered myrbetriq, however, this is likely not to be cost effective given that she does not have medication coverage and this is only brand name.       SOB (shortness of breath)   Intermittent dyspnea and chest tightness, exacerbated by exertion and stress. Symptoms suggest anxiety-related dyspnea in the setting of COPD, with differential including cardiac issues. Echocardiogram one year ago showed grade one diastolic dysfunction. Mild rhonci in the  lungs on auscultation consistent with increased mucous production in the setting of COPD. No fevers or chills present to indicate possible infective nature.  Discussed risks of untreated dyspnea and benefits of Trelegy inhaler. I do feel that she should be on chronic daily management, however, she mentions not having medication coverage, which will significantly limit our options.  - Order chest CT to rule out pulmonary fluid, enlarged heart - Provide Trelegy inhaler sample for two weeks - Instruct to rinse mouth after inhaler use - Discuss potential need for daily inhaler if symptoms persist      Relevant Orders   Brain natriuretic peptide (Completed)   Hemoglobin A1c (Completed)   CBC with Differential/Platelet (Completed)   Comprehensive metabolic panel (Completed)   CT Chest Wo Contrast (Completed)   Other specified anxiety disorders   Anxiety very likely contributing to dyspnea. Recent stressors include home repairs and family issues. Previous Xanax use provided relief. Discussed risks of untreated anxiety, benefits and side effects of Xanax, and non-pharmacological  strategies. She declines medication at this time but is aware that she can contact me if she feels this would be helpful.  - Contact me if you would like something for anxiety - Use non-pharmacological anxiety management strategies such a deep breathing in then blowing out like blowing through a straw and meditation.       Venous insufficiency of both lower extremities   Other Visit Diagnoses       Need for influenza vaccination       Relevant Orders   Flu Vaccine Trivalent High Dose (Fluad) (Completed)     Impaired fasting glucose       Relevant Orders   Hemoglobin A1c (Completed)  Camie FORBES Doing, DNP, AGNP-c Time: 55 minutes, >50% spent counseling, care coordination, chart review, and documentation.

## 2023-09-24 NOTE — Patient Instructions (Addendum)
 I have sent in the order for your CT scan. If there is an issue I will let you know.   I would like you to try the Trelegy inhaler for 2 weeks and then let me know when you come back if this helped.   If your symptoms change or get worse, please let me know. I don't think this is your heart given that you had a good report a year ago on your echocardiogram, but I don't want to rule anything out specifically too soon.    If you feel like you could use something for the anxiety, please let me know.      Try levocetirizine (xyzal) for the allergies and sleep.

## 2023-09-25 ENCOUNTER — Encounter: Payer: Self-pay | Admitting: Nurse Practitioner

## 2023-09-25 LAB — COMPREHENSIVE METABOLIC PANEL
ALT: 9 IU/L (ref 0–32)
AST: 16 IU/L (ref 0–40)
Albumin: 4 g/dL (ref 3.7–4.7)
Alkaline Phosphatase: 85 IU/L (ref 44–121)
BUN/Creatinine Ratio: 18 (ref 12–28)
BUN: 14 mg/dL (ref 8–27)
Bilirubin Total: 0.3 mg/dL (ref 0.0–1.2)
CO2: 27 mmol/L (ref 20–29)
Calcium: 9.6 mg/dL (ref 8.7–10.3)
Chloride: 101 mmol/L (ref 96–106)
Creatinine, Ser: 0.79 mg/dL (ref 0.57–1.00)
Globulin, Total: 3 g/dL (ref 1.5–4.5)
Glucose: 75 mg/dL (ref 70–99)
Potassium: 5.1 mmol/L (ref 3.5–5.2)
Sodium: 140 mmol/L (ref 134–144)
Total Protein: 7 g/dL (ref 6.0–8.5)
eGFR: 73 mL/min/{1.73_m2} (ref >59)

## 2023-09-25 LAB — CBC WITH DIFFERENTIAL/PLATELET
Basophils Absolute: 0.1 10*3/uL (ref 0.0–0.2)
Basos: 1 %
EOS (ABSOLUTE): 0.6 10*3/uL — ABNORMAL HIGH (ref 0.0–0.4)
Eos: 7 %
Hematocrit: 31.9 % — ABNORMAL LOW (ref 34.0–46.6)
Hemoglobin: 9.6 g/dL — ABNORMAL LOW (ref 11.1–15.9)
Immature Grans (Abs): 0 10*3/uL (ref 0.0–0.1)
Immature Granulocytes: 0 %
Lymphocytes Absolute: 1.7 10*3/uL (ref 0.7–3.1)
Lymphs: 21 %
MCH: 25.5 pg — ABNORMAL LOW (ref 26.6–33.0)
MCHC: 30.1 g/dL — ABNORMAL LOW (ref 31.5–35.7)
MCV: 85 fL (ref 79–97)
Monocytes Absolute: 0.9 10*3/uL (ref 0.1–0.9)
Monocytes: 10 %
Neutrophils Absolute: 5.1 10*3/uL (ref 1.4–7.0)
Neutrophils: 61 %
Platelets: 326 10*3/uL (ref 150–450)
RBC: 3.76 x10E6/uL — ABNORMAL LOW (ref 3.77–5.28)
RDW: 13 % (ref 11.7–15.4)
WBC: 8.4 10*3/uL (ref 3.4–10.8)

## 2023-09-25 LAB — HEMOGLOBIN A1C
Est. average glucose Bld gHb Est-mCnc: 120 mg/dL
Hgb A1c MFr Bld: 5.8 % — ABNORMAL HIGH (ref 4.8–5.6)

## 2023-09-25 LAB — BRAIN NATRIURETIC PEPTIDE: BNP: 151.1 pg/mL — ABNORMAL HIGH (ref 0.0–100.0)

## 2023-09-25 LAB — TSH: TSH: 1.35 u[IU]/mL (ref 0.450–4.500)

## 2023-09-27 ENCOUNTER — Other Ambulatory Visit: Payer: Medicare Other

## 2023-09-29 ENCOUNTER — Encounter: Payer: Self-pay | Admitting: Nurse Practitioner

## 2023-09-29 ENCOUNTER — Ambulatory Visit
Admission: RE | Admit: 2023-09-29 | Discharge: 2023-09-29 | Disposition: A | Discharge status: Home / Self Care | Payer: Medicare Other | Source: Ambulatory Visit | Attending: Nurse Practitioner | Admitting: Nurse Practitioner

## 2023-09-29 DIAGNOSIS — R062 Wheezing: Secondary | ICD-10-CM | POA: Diagnosis not present

## 2023-09-29 DIAGNOSIS — J449 Chronic obstructive pulmonary disease, unspecified: Secondary | ICD-10-CM

## 2023-09-29 DIAGNOSIS — J9811 Atelectasis: Secondary | ICD-10-CM | POA: Diagnosis not present

## 2023-09-29 DIAGNOSIS — R600 Localized edema: Secondary | ICD-10-CM

## 2023-09-29 DIAGNOSIS — R0602 Shortness of breath: Secondary | ICD-10-CM

## 2023-09-29 DIAGNOSIS — Z86711 Personal history of pulmonary embolism: Secondary | ICD-10-CM

## 2023-09-29 DIAGNOSIS — I700 Atherosclerosis of aorta: Secondary | ICD-10-CM | POA: Diagnosis not present

## 2023-09-29 NOTE — Assessment & Plan Note (Signed)
 Anxiety very likely contributing to dyspnea. Recent stressors include home repairs and family issues. Previous Xanax use provided relief. Discussed risks of untreated anxiety, benefits and side effects of Xanax, and non-pharmacological strategies. She declines medication at this time but is aware that she can contact me if she feels this would be helpful.  - Contact me if you would like something for anxiety - Use non-pharmacological anxiety management strategies such a deep breathing in then blowing out like blowing through a straw and meditation.

## 2023-09-29 NOTE — Assessment & Plan Note (Signed)
 Blood pressure within goal of 130/85 today with no concerning symptoms present. She is currently managed with losartan and is tolerating this well.  -Continue your current dose of losartan.

## 2023-09-29 NOTE — Assessment & Plan Note (Addendum)
 Frequent day and night time urination with reports of very little water /liquid intake. I suspect she is getting more fluids from her meals that realized or her urinary episodes have little volume. I don't want to exacerbate this problem, but given the fluid retention present, we will consider low dose diuretic in the morning if kidney function is appropriate. Strongly recommend reduction of caffeine after 3 pm. Ditropan  ineffective in the past. Considered myrbetriq, however, this is likely not to be cost effective given that she does not have medication coverage and this is only brand name.

## 2023-09-29 NOTE — Assessment & Plan Note (Signed)
 Chronic neuropathy in feet, managed with gabapentin  at night. Recent incident of cutting toe without feeling it. Discussed potential benefits of B6 and B12 vitamins for neuropathic pain. - Continue gabapentin  at night - Consider trial of B6 and B12 vitamins for neuropathic pain

## 2023-09-29 NOTE — Assessment & Plan Note (Signed)
 No symptoms of low O2 or shortness of breath present at this time. Episodes of symptoms and then resolution not consistent with recurrence of PE, which is reassuring. Leg edema shows no areas of warmth, tenderness, or erythema to indicate DVT. Will continue to monitor.

## 2023-09-29 NOTE — Assessment & Plan Note (Signed)
 Will check labs while drawing other labs today

## 2023-09-29 NOTE — Assessment & Plan Note (Signed)
 Intermittent dyspnea and chest tightness, exacerbated by exertion and stress. Symptoms suggest anxiety-related dyspnea in the setting of COPD, with differential including cardiac issues. Echocardiogram one year ago showed grade one diastolic dysfunction. Mild rhonci in the  lungs on auscultation consistent with increased mucous production in the setting of COPD. No fevers or chills present to indicate possible infective nature.  Discussed risks of untreated dyspnea and benefits of Trelegy inhaler. I do feel that she should be on chronic daily management, however, she mentions not having medication coverage, which will significantly limit our options.  - Order chest CT to rule out pulmonary fluid, enlarged heart - Provide Trelegy inhaler sample for two weeks - Instruct to rinse mouth after inhaler use - Discuss potential need for daily inhaler if symptoms persist

## 2023-09-29 NOTE — Assessment & Plan Note (Signed)
 Edema present bilaterally. This has been present on examination in the past, but the patient reports that it is not improving overnight as it once did. In the setting of dyspnea, there are concerns for possible HF, however, and echocardiogram was performed about a year ago for the same symptoms and this was normal. I will get a BNP today for monitoring.  I will send a referral for a vascular evaluation if this has not been done.  Unfortunately, she reports too much pain when elevating her legs, therefore I anticipate that this symptom will persist. We can consider a diuretic after kidney function assessed, although she reports frequent urination and I don't want to make this much worse.  Skin is intact with no weeping at this time  We will monitor labs and review chart and I will send orders as appropriate.

## 2023-09-29 NOTE — Assessment & Plan Note (Signed)
>>  ASSESSMENT AND PLAN FOR PARESTHESIA OF BILATERAL LEGS WRITTEN ON 09/29/2023  5:55 PM BY Sapphira Harjo E, NP  Chronic neuropathy in feet, managed with gabapentin  at night. Recent incident of cutting toe without feeling it. Discussed potential benefits of B6 and B12 vitamins for neuropathic pain. - Continue gabapentin  at night - Consider trial of B6 and B12 vitamins for neuropathic pain

## 2023-09-29 NOTE — Assessment & Plan Note (Signed)
 COPD has been well-managed since smoking cessation six years ago. Experiencing intermittent dyspnea and tightness in the chest which could indicate a possible exacerbation. She does have rhonchi present in the lungs indicating the presence of mucous. This is expected in COPD, but given her symptoms further evaluation is necessary. Symptoms potentially exacerbated by anxiety. Discussed monitoring COPD symptoms and potential need for daily inhaler. Sample of Trelegy provided to see if this is effective. - Monitor COPD symptoms -CT of chest ordered - consider medication for anxiety, as this often accompanies COPD, particularly with the shortness of breath that can be felt.  - Consider daily inhaler if Trelegy is effective

## 2023-09-30 ENCOUNTER — Encounter: Payer: Self-pay | Admitting: Nurse Practitioner

## 2023-09-30 ENCOUNTER — Other Ambulatory Visit: Payer: Self-pay

## 2023-09-30 DIAGNOSIS — I700 Atherosclerosis of aorta: Secondary | ICD-10-CM

## 2023-09-30 DIAGNOSIS — J449 Chronic obstructive pulmonary disease, unspecified: Secondary | ICD-10-CM

## 2023-09-30 DIAGNOSIS — R0602 Shortness of breath: Secondary | ICD-10-CM

## 2023-10-04 ENCOUNTER — Encounter (HOSPITAL_BASED_OUTPATIENT_CLINIC_OR_DEPARTMENT_OTHER): Payer: Self-pay | Admitting: Cardiology

## 2023-10-04 ENCOUNTER — Ambulatory Visit (HOSPITAL_BASED_OUTPATIENT_CLINIC_OR_DEPARTMENT_OTHER): Payer: Medicare Other | Admitting: Cardiology

## 2023-10-04 VITALS — BP 140/80 | HR 62 | Ht 65.0 in | Wt 165.0 lb

## 2023-10-04 DIAGNOSIS — I700 Atherosclerosis of aorta: Secondary | ICD-10-CM | POA: Diagnosis not present

## 2023-10-04 DIAGNOSIS — R0609 Other forms of dyspnea: Secondary | ICD-10-CM | POA: Diagnosis not present

## 2023-10-04 DIAGNOSIS — Z712 Person consulting for explanation of examination or test findings: Secondary | ICD-10-CM

## 2023-10-04 DIAGNOSIS — I2510 Atherosclerotic heart disease of native coronary artery without angina pectoris: Secondary | ICD-10-CM | POA: Diagnosis not present

## 2023-10-04 DIAGNOSIS — R7989 Other specified abnormal findings of blood chemistry: Secondary | ICD-10-CM

## 2023-10-04 NOTE — Progress Notes (Signed)
 Cardiology Office Note:  .   Date:  10/16/2023  ID:  Kathryn [name], DOB 02/17/1935, MRN [id number] PCP: Oris Camie BRAVO, NP  [facility] Providers Cardiologist:  Shelda Bruckner, MD {  History of Present Illness: .   Kathryn  KERSTI [name] is a 89 y.o. female  with a hx of hypertension, chronic venous insufficiency . I met her 08/11/2022.  Pertinent CV history: Echo 09/02/2022 with EF 60-65%, G1DD (normal for age), RV normal, RAP 3, no significant valve disease.  Today: Seen by Camie Oris 09/29/23 for shortness of breath and chest tightness. Noted rhonchi on exam, increased mucus production. CT chest noted aortic atherosclerosis, coronary calcification, no pericardial effusion. Lungs with large lung volumes, chronic atelectasis, no pleural effusion. No evidence of pneumonia.  Per mychart, requesting evaluation due to findings of aortic and coronary calcification.  Here with her daughter today. Has chronic shortness of breath, daughter notes this is gradually getting worse. Notes that she has been having a lot of stress, furnace at home has been problematic, daughter felt like she was breathing so fast she couldn't talk. Sometimes notes worsening shortness of breath with activities like showering, better with rest.   Has used trelegy inhaler a few times, feels that it is too soon to know if it is helping.  Had CT chest and BNP done last week, reviewed together. Reviewed her actual images together today.  Has had no chest pain at all.  Chronic LE edema stable. Has chronic neuropathy, including r leg cool sensation.  ROS: Denies chest pain. No PND, orthopnea, worsening LE edema or unexpected weight gain. No syncope or palpitations. ROS otherwise negative except as noted.   Studies Reviewed: SABRA    EKG:  EKG Interpretation Date/Time:  Monday October 04 2023 16:26:04 EST Ventricular Rate:  62 PR Interval:  154 QRS Duration:  118 QT Interval:  468 QTC Calculation: 475 R  Axis:   22  Text Interpretation: Normal sinus rhythm Incomplete right bundle branch block Confirmed by Bruckner Shelda [phone]) on 10/16/2023 10:01:25 AM    Physical Exam:   VS:  BP (!) 140/80   Pulse 62   Ht 5' 5 (1.651 m)   Wt 165 lb (74.8 kg)   SpO2 (!) 72%   BMI 27.46 kg/m    Wt Readings from Last 3 Encounters:  10/04/23 165 lb (74.8 kg)  09/24/23 165 lb 12.8 oz (75.2 kg)  02/25/23 168 lb (76.2 kg)    GEN: Well nourished, well developed in no acute distress HEENT: Normal, moist mucous membranes NECK: No JVD CARDIAC: regular rhythm, normal S1 and S2, no rubs or gallops. No murmur. VASCULAR: Radial and DP pulses 2+ bilaterally. No carotid bruits RESPIRATORY:  Clear to auscultation without rales, wheezing or rhonchi  ABDOMEN: Soft, non-tender, non-distended MUSCULOSKELETAL:  Ambulates independently SKIN: Warm and dry, no pitting edema NEUROLOGIC:  Alert and oriented x 3. No focal neuro deficits noted. PSYCHIATRIC:  Normal affect    ASSESSMENT AND PLAN: .    Aortic atherosclerosis Coronary artery calcification -discussed at length today. Reviewed her actual images  DOE -discussed lexiscan myoview today. We discussed pros/cons. She has not had any chest pain. She recently had her inhaler changed, unclear if this is helping. She feels that her symptoms are more pulmonary. At this time, she would like to hold on stress test. Reviewed red flag warning signs that need immediate medical attention -has referral into pulmonology pending as well. Has not had PFTs from what I can  see  Anemia -denies melena/hematochezia -has follow up pending to discuss further  Elevated BNP Bilateral mild LE edema, consistent with venous insufficiency -BNP only very mildly elevated -no JVD, lungs clear -LE edema with skin changes, spider veins. Most consistent with venous insufficiency. Reports no prior improvement with diuretics -echo unremarkable. Only grade 1 diastolic dysfunction  (normal for age) -does not tolerate compression stockings. Recommended elevation as able -avoid salt   Hypertension -slightly above goal, but given age reasonable to continue current regimen  Dispo: 3 months to monitor symptoms or sooner as needed  Total time of encounter: I spent 56 minutes dedicated to the care of this patient on the date of this encounter to include pre-visit review of records, face-to-face time with the patient discussing conditions above, and clinical documentation with the electronic health record. We specifically spent time today discussing causes of shortness of breath, reviewed her CT images, discussing cardiac vs noncardiac etiologies, discussing further testing, reviewing signs/symptoms that need immediate medical attention.   Signed, Shelda Bruckner, MD   Shelda Bruckner, MD, PhD, [facility]  [facility]  [place]  Heart & Vascular at [facility] at [facility] [address], [address] [place], [place] [address] [phone]

## 2023-10-04 NOTE — Patient Instructions (Signed)
Medication Instructions:  Your physician recommends that you continue on your current medications as directed. Please refer to the Current Medication list given to you today.   *If you need a refill on your cardiac medications before your next appointment, please call your pharmacy*  Lab Work: NONE   Testing/Procedures: NONE   Follow-Up: At [facility], you and your health needs are our priority.  As part of our continuing mission to provide you with exceptional heart care, we have created designated Provider Care Teams.  These Care Teams include your primary Cardiologist (physician) and Advanced Practice Providers (APPs -  Physician Assistants and Nurse Practitioners) who all work together to provide you with the care you need, when you need it.  We recommend signing up for the patient portal called "MyChart".  Sign up information is provided on this After Visit Summary.  MyChart is used to connect with patients for Virtual Visits (Telemedicine).  Patients are able to view lab/test results, encounter notes, upcoming appointments, etc.  Non-urgent messages can be sent to your provider as well.   To learn more about what you can do with MyChart, go to [email or web address].    Your next appointment:   3 month(s)  The format for your next appointment:   In Person  Provider:   Bridgette Christopher, MD              

## 2023-10-05 ENCOUNTER — Other Ambulatory Visit (INDEPENDENT_AMBULATORY_CARE_PROVIDER_SITE_OTHER): Payer: Medicare Other

## 2023-10-05 DIAGNOSIS — J449 Chronic obstructive pulmonary disease, unspecified: Secondary | ICD-10-CM

## 2023-10-05 NOTE — Progress Notes (Signed)
   10/05/2023  Patient ID: Kathryn [name], female   DOB: 1935-09-10, 89 y.o.   MRN: [id number]  Reached out to patient via telephone at referral of PCP to discuss inhaler assistance options.  Reviewed patient's household income and size, current insurance status. Would qualify for [employer]  assistance through [employer]. Will prepare application and patient plans to come by to sign at her earliest convenience.  Patient will continue to use her Trelegy inhaler while this is pending.  Jon VEAR Lindau, PharmD Clinical Pharmacist [phone]

## 2023-10-07 DIAGNOSIS — H40053 Ocular hypertension, bilateral: Secondary | ICD-10-CM | POA: Diagnosis not present

## 2023-10-16 ENCOUNTER — Encounter (HOSPITAL_BASED_OUTPATIENT_CLINIC_OR_DEPARTMENT_OTHER): Payer: Self-pay | Admitting: Cardiology

## 2023-10-19 ENCOUNTER — Encounter: Payer: Self-pay | Admitting: Nurse Practitioner

## 2023-10-19 ENCOUNTER — Other Ambulatory Visit: Payer: Self-pay

## 2023-10-19 ENCOUNTER — Ambulatory Visit (INDEPENDENT_AMBULATORY_CARE_PROVIDER_SITE_OTHER): Payer: Medicare Other | Admitting: Nurse Practitioner

## 2023-10-19 ENCOUNTER — Other Ambulatory Visit (HOSPITAL_BASED_OUTPATIENT_CLINIC_OR_DEPARTMENT_OTHER): Payer: Self-pay

## 2023-10-19 VITALS — BP 124/78 | HR 79 | Wt 164.2 lb

## 2023-10-19 DIAGNOSIS — E611 Iron deficiency: Secondary | ICD-10-CM | POA: Diagnosis not present

## 2023-10-19 DIAGNOSIS — R202 Paresthesia of skin: Secondary | ICD-10-CM | POA: Diagnosis not present

## 2023-10-19 DIAGNOSIS — I10 Essential (primary) hypertension: Secondary | ICD-10-CM

## 2023-10-19 DIAGNOSIS — M545 Low back pain, unspecified: Secondary | ICD-10-CM | POA: Diagnosis not present

## 2023-10-19 DIAGNOSIS — E538 Deficiency of other specified B group vitamins: Secondary | ICD-10-CM

## 2023-10-19 DIAGNOSIS — D649 Anemia, unspecified: Secondary | ICD-10-CM

## 2023-10-19 DIAGNOSIS — G63 Polyneuropathy in diseases classified elsewhere: Secondary | ICD-10-CM | POA: Diagnosis not present

## 2023-10-19 DIAGNOSIS — J310 Chronic rhinitis: Secondary | ICD-10-CM | POA: Diagnosis not present

## 2023-10-19 DIAGNOSIS — E079 Disorder of thyroid, unspecified: Secondary | ICD-10-CM | POA: Diagnosis not present

## 2023-10-19 DIAGNOSIS — E039 Hypothyroidism, unspecified: Secondary | ICD-10-CM

## 2023-10-19 DIAGNOSIS — J449 Chronic obstructive pulmonary disease, unspecified: Secondary | ICD-10-CM

## 2023-10-19 MED ORDER — LOSARTAN POTASSIUM 50 MG PO TABS
50.0000 mg | ORAL_TABLET | Freq: Every day | ORAL | 3 refills | Status: DC
  Filled 2023-10-19 – 2023-11-19 (×2): qty 90, 90d supply, fill #0
  Filled 2024-02-21: qty 90, 90d supply, fill #1
  Filled 2024-05-25: qty 90, 90d supply, fill #2
  Filled 2024-08-21: qty 90, 90d supply, fill #3

## 2023-10-19 MED ORDER — LEVOTHYROXINE SODIUM 100 MCG PO TABS
100.0000 ug | ORAL_TABLET | Freq: Every day | ORAL | 3 refills | Status: DC
  Filled 2023-10-19: qty 30, 30d supply, fill #0
  Filled 2024-01-03: qty 90, 90d supply, fill #1
  Filled 2024-04-04: qty 30, 30d supply, fill #2

## 2023-10-19 MED ORDER — GABAPENTIN 100 MG PO CAPS
100.0000 mg | ORAL_CAPSULE | Freq: Three times a day (TID) | ORAL | 3 refills | Status: DC | PRN
  Filled 2023-10-19: qty 90, 10d supply, fill #0
  Filled 2024-03-06: qty 90, 10d supply, fill #1
  Filled 2024-06-13: qty 90, 10d supply, fill #2

## 2023-10-19 MED ORDER — GABAPENTIN 100 MG PO CAPS
100.0000 mg | ORAL_CAPSULE | Freq: Three times a day (TID) | ORAL | 0 refills | Status: DC | PRN
  Filled 2023-10-19: qty 90, 10d supply, fill #0

## 2023-10-19 NOTE — Assessment & Plan Note (Signed)
Pain localized around the waistline, primarily on the right side, exacerbated by leg lifting, suggesting musculoskeletal or nerve-related etiology. No urinary symptoms. Physical exam and history suggest arthritis or nerve impingement. Tylenol and Aleve have been effective. Discussed physical therapy, gentle stretching, and heating pads for relief. - Check urine for infection - Continue gabapentin - Recommend Tylenol or Aleve for pain management - Provide stretches for back pain - Consider physical therapy if pain persists - Advise use of heating pad for pain relief

## 2023-10-19 NOTE — Assessment & Plan Note (Addendum)
No changes at this time. Most recent labs stable. Refills provided.

## 2023-10-19 NOTE — Progress Notes (Signed)
Shawna Clamp, DNP, AGNP-c [facility]  [address] [place], [place] [address] [phone]  ESTABLISHED PATIENT- Chronic Health and/or Follow-Up Visit  Blood pressure 124/78, pulse 79, weight 164 lb 3.2 oz (74.5 kg), SpO2 97%.    Kathryn [name] is a 89 y.o. year old female presenting today for evaluation and management of chronic conditions.   Kathryn presents with concerns of back pain and follow-up for breathing. She is accompanied by her daughter.   She has been experiencing significant back pain for the past three days, primarily around the waistline. Initially, the pain started on one side but has since moved to both sides. The pain is exacerbated by lifting her foot or getting into bed. No urinary symptoms such as increased frequency, burning, or changes in urine color or odor are present. She has a history of urinary issues but reports no current changes. She has been managing the pain with Tylenol and Aleve, which provide some relief.  She has a history of anxiety, which is generally well-controlled. Recently, she experienced an episode where she became 'excited' and had difficulty talking, attributed to an anxiety attack. She has previously used Xanax for anxiety, which was effective in calming her down. Such episodes are infrequent.  She mentions a chronic runny nose, persistent for years, described as a constant drip, particularly noticeable when brushing her teeth. Various treatments, including Benadryl and nasal sprays, have been tried with little relief. Occasional sneezing and nighttime nasal congestion are also reported.  She is currently taking gabapentin, losartan, and Synthroid. Gabapentin is taken primarily at night to help with pain and sleep. Her medications are affordable, and she has no issues with adherence.   All ROS negative with exception of what is listed above.   PHYSICAL EXAM Physical Exam Vitals and nursing note reviewed.   Constitutional:      General: She is not in acute distress.    Appearance: Normal appearance. She is not ill-appearing.  Eyes:     Conjunctiva/sclera: Conjunctivae normal.  Cardiovascular:     Rate and Rhythm: Normal rate and regular rhythm.     Pulses: Normal pulses.     Heart sounds: Normal heart sounds.  Pulmonary:     Effort: Pulmonary effort is normal.     Breath sounds: Wheezing and rhonchi present.  Abdominal:     General: Bowel sounds are normal.     Palpations: Abdomen is soft.  Musculoskeletal:       Arms:     Cervical back: Normal range of motion.     Right lower leg: Edema present.     Left lower leg: Edema present.     Comments: Non-pitting edema present, mild in nature.   Skin:    General: Skin is warm and dry.     Capillary Refill: Capillary refill takes less than 2 seconds.     Comments: Varicosities noted on the lower extremities.   Neurological:     Mental Status: She is alert and oriented to person, place, and time.     Motor: Weakness present.  Psychiatric:        Mood and Affect: Mood normal.        Behavior: Behavior normal.      PLAN Problem List Items Addressed This Visit     Chronic obstructive pulmonary disease, unspecified (HCC)   CT scan shows pulmonary scarring, indicative of chronic changes. Cardiologist Dr. Cristal Deer found no immediate concerns but recommended follow-up in three months. No active infection or immediate  intervention required. - Continue current management - Follow up with cardiologist in three months      Hypothyroidism   No changes at this time. Most recent labs stable. Refills provided.       Relevant Medications   levothyroxine (SYNTHROID) 100 MCG tablet   Chronic rhinitis   Chronic runny nose, particularly when brushing teeth or cooking, not alleviated by antihistamines or nasal sprays. Discussed potential benefit of ipratropium nasal spray. - Consider trial of ipratropium nasal spray      Anemia - Primary    Relevant Orders   Vitamin B12   CBC with Differential/Platelet   Iron, TIBC and Ferritin Panel   Acute bilateral low back pain without sciatica   Pain localized around the waistline, primarily on the right side, exacerbated by leg lifting, suggesting musculoskeletal or nerve-related etiology. No urinary symptoms. Physical exam and history suggest arthritis or nerve impingement. Tylenol and Aleve have been effective. Discussed physical therapy, gentle stretching, and heating pads for relief. - Check urine for infection - Continue gabapentin - Recommend Tylenol or Aleve for pain management - Provide stretches for back pain - Consider physical therapy if pain persists - Advise use of heating pad for pain relief      Relevant Orders   POCT URINALYSIS DIP (CLINITEK)   Neuropathy associated with thyroid disease (HCC)   Neuropathic pain in the legs, managed with gabapentin, more pronounced at the end of the day. Emphasized taking gabapentin primarily at night. - Continue gabapentin, primarily at night - Monitor for changes in pain pattern      Relevant Medications   levothyroxine (SYNTHROID) 100 MCG tablet   Benign essential hypertension   Relevant Medications   losartan (COZAAR) 50 MG tablet   Paresthesia of bilateral legs   Relevant Medications   gabapentin (NEURONTIN) 100 MG capsule    Return in about 6 months (around 04/17/2024) for Med Management 30.  Shawna Clamp, DNP, AGNP-c

## 2023-10-19 NOTE — Assessment & Plan Note (Signed)
Chronic runny nose, particularly when brushing teeth or cooking, not alleviated by antihistamines or nasal sprays. Discussed potential benefit of ipratropium nasal spray. - Consider trial of ipratropium nasal spray

## 2023-10-19 NOTE — Patient Instructions (Addendum)
I want to recheck your labs to make sure that you are not dropping your red blood cells further. I will also check your B12 levels and make sure this is not causing your red blood cells to drop.    Keep taking the Aleve and Tylenol alternating for the back pain. I also recommend sitting on a heating pad to help reduce the pain.  I have added stretches on the back of this page that may help with the pain.

## 2023-10-19 NOTE — Assessment & Plan Note (Signed)
Neuropathic pain in the legs, managed with gabapentin, more pronounced at the end of the day. Emphasized taking gabapentin primarily at night. - Continue gabapentin, primarily at night - Monitor for changes in pain pattern

## 2023-10-19 NOTE — Assessment & Plan Note (Signed)
CT scan shows pulmonary scarring, indicative of chronic changes. Cardiologist Dr. Cristal Deer found no immediate concerns but recommended follow-up in three months. No active infection or immediate intervention required. - Continue current management - Follow up with cardiologist in three months

## 2023-10-20 LAB — POCT URINALYSIS DIP (CLINITEK)
Bilirubin, UA: NEGATIVE
Blood, UA: NEGATIVE
Glucose, UA: NEGATIVE mg/dL
Ketones, POC UA: NEGATIVE mg/dL
Nitrite, UA: NEGATIVE
POC PROTEIN,UA: NEGATIVE
Spec Grav, UA: 1.01 (ref 1.010–1.025)
Urobilinogen, UA: 0.2 U/dL
pH, UA: 6 (ref 5.0–8.0)

## 2023-10-20 LAB — IRON,TIBC AND FERRITIN PANEL
Ferritin: 13 ng/mL — ABNORMAL LOW (ref 15–150)
Iron Saturation: 5 % — CL (ref 15–55)
Iron: 22 ug/dL — ABNORMAL LOW (ref 27–139)
Total Iron Binding Capacity: 407 ug/dL (ref 250–450)
UIBC: 385 ug/dL — ABNORMAL HIGH (ref 118–369)

## 2023-10-20 LAB — CBC WITH DIFFERENTIAL/PLATELET
Basophils Absolute: 0.1 10*3/uL (ref 0.0–0.2)
Basos: 1 %
EOS (ABSOLUTE): 0.2 10*3/uL (ref 0.0–0.4)
Eos: 3 %
Hematocrit: 31.3 % — ABNORMAL LOW (ref 34.0–46.6)
Hemoglobin: 9.2 g/dL — ABNORMAL LOW (ref 11.1–15.9)
Immature Grans (Abs): 0 10*3/uL (ref 0.0–0.1)
Immature Granulocytes: 0 %
Lymphocytes Absolute: 1.8 10*3/uL (ref 0.7–3.1)
Lymphs: 23 %
MCH: 24.6 pg — ABNORMAL LOW (ref 26.6–33.0)
MCHC: 29.4 g/dL — ABNORMAL LOW (ref 31.5–35.7)
MCV: 84 fL (ref 79–97)
Monocytes Absolute: 0.6 10*3/uL (ref 0.1–0.9)
Monocytes: 8 %
Neutrophils Absolute: 5.1 10*3/uL (ref 1.4–7.0)
Neutrophils: 65 %
Platelets: 262 10*3/uL (ref 150–450)
RBC: 3.74 x10E6/uL — ABNORMAL LOW (ref 3.77–5.28)
RDW: 13 % (ref 11.7–15.4)
WBC: 7.9 10*3/uL (ref 3.4–10.8)

## 2023-10-20 LAB — VITAMIN B12: Vitamin B-12: 245 pg/mL (ref 232–1245)

## 2023-10-20 NOTE — Addendum Note (Signed)
Addended by: Luciana Axe, Nattie Lazenby D on: 10/20/2023 11:59 AM   Modules accepted: Orders

## 2023-10-20 NOTE — Addendum Note (Signed)
Addended by: Jago Carton, Huntley Dec E on: 10/20/2023 07:29 AM   Modules accepted: Orders

## 2023-10-21 ENCOUNTER — Telehealth: Payer: Self-pay

## 2023-10-21 NOTE — Progress Notes (Addendum)
   10/21/2023  Patient ID: Kathryn [name], female   DOB: 1935-04-18, 89 y.o.   MRN: [id number]  Patient stopped by the office to sign the application for [facility] assistance through [employer]. Application submitted, patient approved through 09/20/24. Patient aware.  Sherrill Raring, PharmD Clinical Pharmacist [phone]

## 2023-10-25 ENCOUNTER — Telehealth: Payer: Self-pay

## 2023-10-25 DIAGNOSIS — M545 Low back pain, unspecified: Secondary | ICD-10-CM

## 2023-10-25 DIAGNOSIS — R109 Unspecified abdominal pain: Secondary | ICD-10-CM

## 2023-10-25 MED ORDER — TRAMADOL HCL 50 MG PO TABS
50.0000 mg | ORAL_TABLET | Freq: Three times a day (TID) | ORAL | 0 refills | Status: DC | PRN
  Filled 2023-10-25: qty 20, 7d supply, fill #0

## 2023-10-25 NOTE — Telephone Encounter (Signed)
Pt. Daughter called stating her back pain is getting worse and worse, she is taking aleve, using icy hot, and heating pad nothing seems to be helping. She is scheduled with GI on Friday but she is not sure that the back pain is related to the GI issues and if they can wait that long. The pain wraps around her back and waist line to her belly button. She wanted to know if you could may call in something for her.

## 2023-10-26 ENCOUNTER — Ambulatory Visit
Admission: RE | Admit: 2023-10-26 | Discharge: 2023-10-26 | Disposition: A | Discharge status: Home / Self Care | Payer: Medicare Other | Source: Ambulatory Visit | Attending: Nurse Practitioner

## 2023-10-26 ENCOUNTER — Encounter: Payer: Self-pay | Admitting: Nurse Practitioner

## 2023-10-26 ENCOUNTER — Other Ambulatory Visit (HOSPITAL_BASED_OUTPATIENT_CLINIC_OR_DEPARTMENT_OTHER): Payer: Self-pay

## 2023-10-26 ENCOUNTER — Other Ambulatory Visit: Payer: Self-pay

## 2023-10-26 DIAGNOSIS — D649 Anemia, unspecified: Secondary | ICD-10-CM

## 2023-10-26 DIAGNOSIS — K8020 Calculus of gallbladder without cholecystitis without obstruction: Secondary | ICD-10-CM | POA: Diagnosis not present

## 2023-10-26 DIAGNOSIS — R109 Unspecified abdominal pain: Secondary | ICD-10-CM

## 2023-10-26 DIAGNOSIS — I700 Atherosclerosis of aorta: Secondary | ICD-10-CM | POA: Diagnosis not present

## 2023-10-26 MED ORDER — IOPAMIDOL (ISOVUE-300) INJECTION 61%
100.0000 mL | Freq: Once | INTRAVENOUS | Status: AC | PRN
  Administered 2023-10-26: 100 mL via INTRAVENOUS

## 2023-10-26 NOTE — Telephone Encounter (Signed)
 Please call Kathryn [name].   I have sent in pain medication for her, but with the worsening pain and now encompassing her abdomen, I am concerned this could be something with her liver/gallbladder rather than muscular as we originally thought. I have also ordered a stat US  of the abdomen to see if we can get any better picture.   They should call to schedule this soon. I sent to [facility], but to get her in faster they may need to move the location, which is fine with me.

## 2023-10-26 NOTE — Addendum Note (Signed)
Addended by: Yomayra Tate, Huntley Dec E on: 10/26/2023 08:26 AM   Modules accepted: Orders

## 2023-10-27 ENCOUNTER — Other Ambulatory Visit (HOSPITAL_BASED_OUTPATIENT_CLINIC_OR_DEPARTMENT_OTHER): Payer: Self-pay

## 2023-10-27 ENCOUNTER — Other Ambulatory Visit: Payer: Self-pay

## 2023-10-27 ENCOUNTER — Telehealth: Payer: Self-pay

## 2023-10-27 MED ORDER — SULFAMETHOXAZOLE-TRIMETHOPRIM 800-160 MG PO TABS
1.0000 | ORAL_TABLET | Freq: Two times a day (BID) | ORAL | 0 refills | Status: DC
Start: 2023-10-27 — End: 2023-11-01
  Filled 2023-10-27: qty 10, 5d supply, fill #0

## 2023-10-27 NOTE — Telephone Encounter (Signed)
 Pt. Daughter called LM during lunch stating Kathryn [name] pt. Is very sick, vomiting, and dizzy. She was wondering if it was related to her Tramadol  that she just started taking yesterday.

## 2023-11-01 ENCOUNTER — Ambulatory Visit: Payer: Medicare Other | Admitting: Nurse Practitioner

## 2023-11-01 ENCOUNTER — Other Ambulatory Visit (HOSPITAL_BASED_OUTPATIENT_CLINIC_OR_DEPARTMENT_OTHER): Payer: Self-pay

## 2023-11-01 ENCOUNTER — Encounter: Payer: Self-pay | Admitting: Nurse Practitioner

## 2023-11-01 VITALS — BP 122/72 | HR 66 | Wt 160.8 lb

## 2023-11-01 DIAGNOSIS — D649 Anemia, unspecified: Secondary | ICD-10-CM | POA: Diagnosis not present

## 2023-11-01 DIAGNOSIS — M545 Low back pain, unspecified: Secondary | ICD-10-CM | POA: Diagnosis not present

## 2023-11-01 DIAGNOSIS — M5441 Lumbago with sciatica, right side: Secondary | ICD-10-CM | POA: Diagnosis not present

## 2023-11-01 MED ORDER — METHYLPREDNISOLONE 4 MG PO TBPK
ORAL_TABLET | ORAL | 0 refills | Status: DC
  Filled 2023-11-01: qty 21, 6d supply, fill #0

## 2023-11-01 MED ORDER — METHOCARBAMOL 500 MG PO TABS
750.0000 mg | ORAL_TABLET | Freq: Three times a day (TID) | ORAL | 2 refills | Status: AC | PRN
  Filled 2023-11-01: qty 45, 10d supply, fill #0

## 2023-11-01 NOTE — Assessment & Plan Note (Signed)
 Persistent anemia with symptoms of fatigue, weakness, and pallor. Oral iron supplements caused constipation and were discontinued. Discussed the possibility of iron infusion due to intolerance to oral iron. Risks include potential adverse reactions to infusion. Benefits include improved hemoglobin levels and alleviation of anemia symptoms. Patient is willing to pursue iron infusion if approved by insurance. Discussed the need to monitor hemoglobin levels and potential internal bleeding as a cause of anemia. Patient reported occasional epistaxis but no significant blood loss.   - Order lab tests to check hemoglobin levels   - Submit request for iron infusion and prepare to contest insurance denial if necessary

## 2023-11-01 NOTE — Progress Notes (Signed)
 Kathryn Kief, DNP, AGNP-c [facility] [address] [place], [place] [address] [phone]   ACUTE VISIT- ESTABLISHED PATIENT  Blood pressure 122/72, pulse 66, weight 160 lb 12.8 oz (72.9 kg).  Subjective:  HPI Kathryn  G [name] is a 89 y.o. female presents to day for evaluation of acute concern(s).   History of Present Illness Kathryn  Crissie Dome [name] is an 89 year old female who presents with back pain and symptoms of anemia. She is accompanied by her daughter.  She experiences significant back pain primarily around the waistline, initially on the right side but now spreading across the middle of her back. The pain worsens with right leg lifting, causing a 'grabbing' sensation. No recent falls or injuries are noted. She has tried a heating pad and icy hot patches, which provide temporary relief, but the pain returns once the heat is removed. Tylenol  and Aleve have not provided significant relief.   She reports symptoms consistent with anemia, including weakness, fatigue, and pallor. She experiences nausea and dizziness, particularly in the mornings, without vomiting. These symptoms began after starting tramadol , which she only took three times due to its strong effects. She has a history of constipation attributed to iron supplements, which she has since stopped. She is concerned about her blood levels dropping further, as she has experienced minor epistaxis and is worried about potential internal bleeding. She is unable to tolerate oral iron supplements due to gastrointestinal side effects.  No heart palpitations or irregular heartbeats. Her legs are not swelling as much as before, likely due to spending more time resting in bed over the weekend.  Her daughter had to assist her when she felt faint while shopping last Tuesday.  ROS negative except for what is listed in HPI. History, Medications, Surgery, SDOH, and Family History reviewed and updated as appropriate.   Objective:  Physical Exam Vitals and nursing note reviewed.  Constitutional:      Appearance: She is ill-appearing.  HENT:     Head: Normocephalic.  Neck:     Vascular: No carotid bruit.  Cardiovascular:     Rate and Rhythm: Normal rate and regular rhythm.     Pulses: Normal pulses.     Heart sounds: Normal heart sounds.  Pulmonary:     Effort: Pulmonary effort is normal.     Breath sounds: Rhonchi present.  Abdominal:     General: There is no distension.     Palpations: Abdomen is soft. There is no mass.     Tenderness: There is no abdominal tenderness. There is no right CVA tenderness, left CVA tenderness, guarding or rebound.     Hernia: No hernia is present.  Musculoskeletal:        General: Tenderness present.       Arms:     Comments: Tenderness to the lumbar region centralized at this time, but right sided radiation present with elevation of right leg.   Lymphadenopathy:     Cervical: No cervical adenopathy.  Skin:    General: Skin is warm.     Capillary Refill: Capillary refill takes less than 2 seconds.     Coloration: Skin is pale.  Neurological:     Mental Status: She is alert and oriented to person, place, and time.     Sensory: No sensory deficit.     Motor: Weakness present.     Coordination: Coordination normal.  Psychiatric:        Mood and Affect: Mood normal.  Behavior: Behavior normal.         Assessment & Plan:   Problem List Items Addressed This Visit     Anemia   Persistent anemia with symptoms of fatigue, weakness, and pallor. Oral iron supplements caused constipation and were discontinued. Discussed the possibility of iron infusion due to intolerance to oral iron. Risks include potential adverse reactions to infusion. Benefits include improved hemoglobin levels and alleviation of anemia symptoms. Patient is willing to pursue iron infusion if approved by insurance. Discussed the need to monitor hemoglobin levels and potential internal  bleeding as a cause of anemia. Patient reported occasional epistaxis but no significant blood loss.   - Order lab tests to check hemoglobin levels   - Submit request for iron infusion and prepare to contest insurance denial if necessary        Relevant Orders   CBC with Differential/Platelet   Iron, TIBC and Ferritin Panel   D-dimer, quantitative   Acute midline low back pain with right-sided sciatica - Primary    acute exacerbation, primarily around the waistline and radiating to the right side, likely due to degenerative disc disease and possible pinched sciatic nerve. Pain exacerbated by lifting the right leg. Previous CT scan shows significant degeneration with bones touching. Discussed potential treatments including steroids, muscle relaxers, and physical therapy. Risks of steroid use include mood changes and increased blood sugar. Benefits include reduced inflammation and pain relief. Patient expressed dislike for steroids but agreed to try them. Discussed alternative treatments such as physical therapy and steroid injections. Patient has tried heat and cold therapy with limited relief.   - Prescribe prednisone   - Prescribe muscle relaxer (Rebif)   - Refer to spinal specialist for further evaluation   - Recommend use of heat for pain relief   - Advise patient to report back if pain does not improve by Wednesday        Relevant Medications   methylPREDNISolone  (MEDROL  DOSEPAK) 4 MG TBPK tablet   methocarbamol  (ROBAXIN ) 500 MG tablet   Other Relevant Orders   Ambulatory referral to Spine Surgery      Kathryn Kief, DNP, AGNP-c

## 2023-11-01 NOTE — Patient Instructions (Addendum)
 I want you to let me know on Wednesday if your back or is not better.   Anemia  Anemia is a condition in which there are not enough red blood cells or hemoglobin in the blood. Hemoglobin is a substance in red blood cells that carries oxygen. When you do not have enough red blood cells or hemoglobin (are anemic), your body cannot get enough oxygen, and your organs may not work properly. As a result, you may feel very tired or have other problems. What are the causes? Common causes of anemia include: Excessive bleeding. Anemia can be caused by excessive bleeding inside or outside the body, including bleeding from the intestines or from heavy menstrual periods in females. Poor nutrition. Long-lasting (chronic) kidney, thyroid , and liver disease. Bone marrow disorders, spleen problems, and blood disorders. Cancer and treatments for cancer. Human immunodeficiency virus (HIV) and acquired immunodeficiency syndrome (AIDS). Infections, medicines, and autoimmune disorders that destroy red blood cells. What are the signs or symptoms? Symptoms of this condition include: Minor weakness. Dizziness. Headache, or difficulties concentrating and sleeping. Heartbeats that feel irregular or faster than normal (palpitations). Shortness of breath, especially with exercise. Pale skin, lips, and nails, or cold hands and feet. Upset stomach (indigestion) and nausea. Symptoms may occur suddenly or develop slowly. If your anemia is mild, you may not have symptoms. How is this diagnosed? This condition is diagnosed based on blood tests, your medical history, and a physical exam. In some cases, a test may be needed in which cells are removed from the soft tissue inside of a bone and looked at under a microscope (bone marrow biopsy). Your health care provider may also check your stool (feces) for blood and may do more testing to look for the cause of your bleeding. Other tests may include: Imaging tests, such as a CT  scan or MRI. A procedure to see inside your esophagus and stomach (endoscopy). The esophagus is the part of the body that moves food from your mouth to your stomach. A procedure to see inside your colon and rectum (colonoscopy). How is this treated? Treatment for this condition depends on the cause. If you continue to lose a lot of blood, you may need to be treated at a hospital. Treatment may include: Taking supplements of iron, vitamin B12, or folic acid. Taking a hormone medicine (erythropoietin) that can help to stimulate red blood cell growth. Receiving donated blood through an IV (blood transfusion). This may be needed if you lose a lot of blood. Making changes to your diet. Having surgery to remove your spleen. Follow these instructions at home: Take over-the-counter and prescription medicines only as told by your health care provider. Take supplements only as told by your health care provider. Follow any diet instructions that you were given by your health care provider. Keep all follow-up visits. Your health care provider will want to recheck your blood tests. Contact a health care provider if: You develop new bleeding anywhere in the body. You are very weak. Get help right away if: You are short of breath. You have pain in your abdomen or chest. You are dizzy or feel faint. You have trouble concentrating. You have bloody stools, black stools, or tarry stools. You vomit repeatedly or you vomit up blood. These symptoms may be an emergency. Get help right away. Call 911. Do not wait to see if the symptoms will go away. Do not drive yourself to the hospital. Summary Anemia is a condition in which you do not  have enough red blood cells or enough of a substance in your red blood cells that carries oxygen. Symptoms may occur suddenly or develop slowly. If your anemia is mild, you may not have symptoms. This condition is diagnosed with blood tests, a medical history, and a physical  exam. Other tests may be needed. Treatment for this condition depends on the cause of the anemia. This information is not intended to replace advice given to you by your health care provider. Make sure you discuss any questions you have with your health care provider. Document Revised: 12/01/2021 Document Reviewed: 12/01/2021 Elsevier Patient Education  2024 [employer].

## 2023-11-01 NOTE — Assessment & Plan Note (Signed)
 acute exacerbation, primarily around the waistline and radiating to the right side, likely due to degenerative disc disease and possible pinched sciatic nerve. Pain exacerbated by lifting the right leg. Previous CT scan shows significant degeneration with bones touching. Discussed potential treatments including steroids, muscle relaxers, and physical therapy. Risks of steroid use include mood changes and increased blood sugar. Benefits include reduced inflammation and pain relief. Patient expressed dislike for steroids but agreed to try them. Discussed alternative treatments such as physical therapy and steroid injections. Patient has tried heat and cold therapy with limited relief.   - Prescribe prednisone   - Prescribe muscle relaxer (Rebif)   - Refer to spinal specialist for further evaluation   - Recommend use of heat for pain relief   - Advise patient to report back if pain does not improve by Wednesday

## 2023-11-02 ENCOUNTER — Other Ambulatory Visit: Payer: Self-pay

## 2023-11-02 ENCOUNTER — Encounter: Payer: Self-pay | Admitting: Nurse Practitioner

## 2023-11-02 DIAGNOSIS — D649 Anemia, unspecified: Secondary | ICD-10-CM

## 2023-11-02 LAB — CBC WITH DIFFERENTIAL/PLATELET
Basophils Absolute: 0.1 10*3/uL (ref 0.0–0.2)
Basos: 1 %
EOS (ABSOLUTE): 0.3 10*3/uL (ref 0.0–0.4)
Eos: 4 %
Hematocrit: 34.4 % (ref 34.0–46.6)
Hemoglobin: 10.7 g/dL — ABNORMAL LOW (ref 11.1–15.9)
Immature Grans (Abs): 0 10*3/uL (ref 0.0–0.1)
Immature Granulocytes: 1 %
Lymphocytes Absolute: 1.8 10*3/uL (ref 0.7–3.1)
Lymphs: 23 %
MCH: 25.8 pg — ABNORMAL LOW (ref 26.6–33.0)
MCHC: 31.1 g/dL — ABNORMAL LOW (ref 31.5–35.7)
MCV: 83 fL (ref 79–97)
Monocytes Absolute: 0.6 10*3/uL (ref 0.1–0.9)
Monocytes: 8 %
Neutrophils Absolute: 5.2 10*3/uL (ref 1.4–7.0)
Neutrophils: 63 %
Platelets: 315 10*3/uL (ref 150–450)
RBC: 4.15 x10E6/uL (ref 3.77–5.28)
RDW: 15.1 % (ref 11.7–15.4)
WBC: 8.1 10*3/uL (ref 3.4–10.8)

## 2023-11-02 LAB — IRON,TIBC AND FERRITIN PANEL
Ferritin: 28 ng/mL (ref 15–150)
Iron Saturation: 6 % — CL (ref 15–55)
Iron: 23 ug/dL — ABNORMAL LOW (ref 27–139)
Total Iron Binding Capacity: 380 ug/dL (ref 250–450)
UIBC: 357 ug/dL (ref 118–369)

## 2023-11-02 LAB — D-DIMER, QUANTITATIVE: D-DIMER: 2.36 mg{FEU}/L — ABNORMAL HIGH (ref 0.00–0.49)

## 2023-11-03 ENCOUNTER — Encounter: Payer: Self-pay | Admitting: Nurse Practitioner

## 2023-11-03 ENCOUNTER — Telehealth: Payer: Self-pay

## 2023-11-03 ENCOUNTER — Ambulatory Visit (HOSPITAL_COMMUNITY)
Admission: RE | Admit: 2023-11-03 | Discharge: 2023-11-03 | Disposition: A | Discharge status: Home / Self Care | Payer: Medicare Other | Source: Ambulatory Visit | Attending: Nurse Practitioner | Admitting: Nurse Practitioner

## 2023-11-03 ENCOUNTER — Other Ambulatory Visit: Payer: Self-pay | Admitting: Nurse Practitioner

## 2023-11-03 DIAGNOSIS — R7989 Other specified abnormal findings of blood chemistry: Secondary | ICD-10-CM | POA: Diagnosis not present

## 2023-11-03 DIAGNOSIS — M545 Low back pain, unspecified: Secondary | ICD-10-CM | POA: Diagnosis not present

## 2023-11-03 DIAGNOSIS — K5730 Diverticulosis of large intestine without perforation or abscess without bleeding: Secondary | ICD-10-CM | POA: Diagnosis not present

## 2023-11-03 DIAGNOSIS — Z86711 Personal history of pulmonary embolism: Secondary | ICD-10-CM

## 2023-11-03 DIAGNOSIS — K551 Chronic vascular disorders of intestine: Secondary | ICD-10-CM

## 2023-11-03 DIAGNOSIS — R0602 Shortness of breath: Secondary | ICD-10-CM | POA: Insufficient documentation

## 2023-11-03 DIAGNOSIS — I77810 Thoracic aortic ectasia: Secondary | ICD-10-CM | POA: Diagnosis not present

## 2023-11-03 DIAGNOSIS — M546 Pain in thoracic spine: Secondary | ICD-10-CM | POA: Diagnosis not present

## 2023-11-03 DIAGNOSIS — I701 Atherosclerosis of renal artery: Secondary | ICD-10-CM | POA: Diagnosis not present

## 2023-11-03 DIAGNOSIS — M549 Dorsalgia, unspecified: Secondary | ICD-10-CM | POA: Diagnosis not present

## 2023-11-03 DIAGNOSIS — J984 Other disorders of lung: Secondary | ICD-10-CM | POA: Diagnosis not present

## 2023-11-03 DIAGNOSIS — K4090 Unilateral inguinal hernia, without obstruction or gangrene, not specified as recurrent: Secondary | ICD-10-CM | POA: Diagnosis not present

## 2023-11-03 DIAGNOSIS — K8020 Calculus of gallbladder without cholecystitis without obstruction: Secondary | ICD-10-CM | POA: Diagnosis not present

## 2023-11-03 DIAGNOSIS — R600 Localized edema: Secondary | ICD-10-CM

## 2023-11-03 DIAGNOSIS — I2510 Atherosclerotic heart disease of native coronary artery without angina pectoris: Secondary | ICD-10-CM | POA: Diagnosis not present

## 2023-11-03 MED ORDER — IOHEXOL 350 MG/ML SOLN
75.0000 mL | Freq: Once | INTRAVENOUS | Status: AC | PRN
  Administered 2023-11-03: 75 mL via INTRAVENOUS

## 2023-11-03 NOTE — Telephone Encounter (Signed)
Pt. Daughter called stating her mom is not any better probably worse now. She did not sleep any last night because of her back hurting so bad. She has also not had a bowel movement since Sunday so they gave her some laxatives only thing that makes her go to the bathroom. She was also concerned about her D-dimer results being elevated. I let her know per Les Pou the D-dimer was elevated probably due to her Anemia and COPD.

## 2023-11-03 NOTE — Addendum Note (Signed)
Addended by: Daija Routson, Huntley Dec E on: 11/03/2023 07:14 PM   Modules accepted: Orders

## 2023-11-03 NOTE — Telephone Encounter (Signed)
Please call Clydie Braun (daughter) back to let her know that I have reviewed our visits, the labs, and the prior imaging this morning.   Her symptoms and tests are really not lining up with any specific concern. It may be that some of the symptoms are just distracters throwing a wrench into things or that she is just having uncommon presentations.  The d-dimer is non specific and can be elevated with inflammation, anemia, COPD, and blood clot. It is possible to have an uncommon presentation of a blood clot in the lung or the abdomen with the back pain, which was my reasoning for ordering the test.   Given her history of a pulmonary embolism and her symptoms not improving, I want to rule out a clot.  I have ordered an angiogram which will look specifically at the blood vessels in the lungs  to make sure there are no clots present.  If the lungs are clear and the test cannot see enough into the abdomen, we may need to have them add the abdomen on. This would help rule out possible clot there. I really wanted to avoid further imaging, but I really don't think this is avoidable at this point.   I will send the order stat for these.   A couple of questions: Is her shortness of breath improved, worse, same? Is she having any fevers or chills? Are her legs swelling any worse?  Any signs of redness, warmth, or tenderness in one area in the legs? Has the back pain moved around any more? Is she having any belly pain?   Adam, can you call imaging for me to see if it is possible to order a ct angiogram of the chest for suspected PE with atypical presentation, but if clear, follow with the abdomen? Or if I should go ahead and order both.  Concerns are history of PE, shortness of breath, midline mid back pain without radiation, anemia of unknown etiology, positive d-dimer (>2) and positive wells criteria.

## 2023-11-08 DIAGNOSIS — D509 Iron deficiency anemia, unspecified: Secondary | ICD-10-CM | POA: Diagnosis not present

## 2023-11-09 DIAGNOSIS — Z6826 Body mass index (BMI) 26.0-26.9, adult: Secondary | ICD-10-CM | POA: Diagnosis not present

## 2023-11-09 DIAGNOSIS — M47816 Spondylosis without myelopathy or radiculopathy, lumbar region: Secondary | ICD-10-CM | POA: Diagnosis not present

## 2023-11-12 ENCOUNTER — Encounter (HOSPITAL_BASED_OUTPATIENT_CLINIC_OR_DEPARTMENT_OTHER): Payer: Self-pay

## 2023-11-19 ENCOUNTER — Other Ambulatory Visit (HOSPITAL_BASED_OUTPATIENT_CLINIC_OR_DEPARTMENT_OTHER): Payer: Self-pay

## 2023-11-22 NOTE — Progress Notes (Unsigned)
 [facility] [place]  ASSESSMENT / PLAN: 89 y.o. female with superior mesenteric artery atherosclerosis.  Recommend:  Abstinence from all tobacco products. Blood glucose control with goal A1c < 7%. Blood pressure control with goal blood pressure < 140/90 mmHg. Lipid reduction therapy with goal LDL-C <100 mg/dL Aspirin 81mg  PO QD.  Atorvastatin 40-80mg  PO QD (or other "high intensity" statin therapy).  Her symptoms are not typical for chronic mesenteric ischemia.  Would recommend medical therapy only at this point.  She can follow-up with me as needed.  CHIEF COMPLAINT: back pain  HISTORY OF PRESENT ILLNESS: Kathryn [name] is a 89 y.o. female referred to clinic for evaluation of suspicious CT angiogram findings.  The patient reported mid back pain to her primary care physician which has been very bothersome to her.  She was found to have anemia which was symptomatic.  In workup, a CT angiogram of the abdomen pelvis was performed.  This showed superior mesenteric artery stenosis.  A vascular referral was requested.  On my evaluation, the patient reports no abdominal pain.  She has postprandial pain.  She denies weight loss.  She denies food fear.  She does report some lower extremity swelling which is chronic and consistent with her established diagnosis of chronic venous insufficiency.  She does not have typical symptoms of lower extremity peripheral arterial disease.  Past Medical History:  Diagnosis Date   Arthritis    Chronic pain of right knee 08/07/2016   COPD (chronic obstructive pulmonary disease) (HCC)    Dyspnea    Encounter for subsequent annual wellness visit (AWV) in Medicare patient 01/10/2021   Encounter to establish care 01/02/2021   Hypertension    Hypothyroidism    Neck pain 12/06/2018   Osteopenia    Pneumonia    Pulmonary embolism (HCC) 04/2018   Screening for osteoporosis 01/02/2021   Thyroid disease    Venous stasis of both lower  extremities 01/10/2021    Past Surgical History:  Procedure Laterality Date   APPENDECTOMY     EYE SURGERY     TONSILLECTOMY AND ADENOIDECTOMY     TOTAL KNEE ARTHROPLASTY Right 01/01/2020   Procedure: RIGHT TOTAL KNEE ARTHROPLASTY;  Surgeon: Tarry Kos, MD;  Location: [facility] OR;  Service: Orthopedics;  Laterality: Right;   TUBAL LIGATION     TUBAL LIGATION Bilateral     Family History  Problem Relation Age of Onset   Breast cancer Mother    Liver cancer Mother    Breast cancer Daughter 29   Colon cancer Daughter    Hypothyroidism Daughter    Lung cancer Daughter    Hypothyroidism Daughter    Hypothyroidism Daughter     Social History   Socioeconomic History   Marital status: Divorced    Spouse name: Not on file   Number of children: 5   Years of education: Not on file   Highest education level: Not on file  Occupational History   Not on file  Tobacco Use   Smoking status: Former    Current packs/day: 0.00    Types: Cigarettes    Quit date: 2018    Years since quitting: 7.1   Smokeless tobacco: Never  Vaping Use   Vaping status: Never Used  Substance and Sexual Activity   Alcohol use: Never   Drug use: Never   Sexual activity: Not Currently    Birth control/protection: Abstinence  Other Topics Concern   Not on file  Social History Narrative  Not on file   Social Drivers of Health   Financial Resource Strain: Low Risk  (01/12/2022)   Overall Financial Resource Strain (CARDIA)    Difficulty of Paying Living Expenses: Not hard at all  Food Insecurity: No Food Insecurity (01/12/2022)   Hunger Vital Sign    Worried About Running Out of Food in the Last Year: Never true    Ran Out of Food in the Last Year: Never true  Transportation Needs: No Transportation Needs (01/12/2022)   PRAPARE - Administrator, Civil Service (Medical): No    Lack of Transportation (Non-Medical): No  Physical Activity: Inactive (01/12/2022)   Exercise Vital Sign    Days of  Exercise per Week: 0 days    Minutes of Exercise per Session: 0 min  Stress: No Stress Concern Present (01/12/2022)   [employer] of Occupational Health - Occupational Stress Questionnaire    Feeling of Stress : Not at all  Social Connections: Socially Isolated (01/12/2022)   Social Connection and Isolation Panel [NHANES]    Frequency of Communication with Friends and Family: More than three times a week    Frequency of Social Gatherings with Friends and Family: More than three times a week    Attends Religious Services: Never    Database administrator or Organizations: No    Attends Banker Meetings: Never    Marital Status: Divorced  Catering manager Violence: Not At Risk (01/12/2022)   Humiliation, Afraid, Rape, and Kick questionnaire    Fear of Current or Ex-Partner: No    Emotionally Abused: No    Physically Abused: No    Sexually Abused: No    Allergies  Allergen Reactions   Lisinopril Cough    Current Outpatient Medications  Medication Sig Dispense Refill   acetaminophen (TYLENOL) 500 MG tablet 1 tablet as needed     albuterol (VENTOLIN HFA) 108 (90 Base) MCG/ACT inhaler Inhale 2 puffs into the lungs every 6 (six) hours as needed for wheezing or shortness of breath. 8 g 0   aspirin 81 MG chewable tablet 1 tablet     b complex vitamins capsule Take 1 capsule by mouth daily.     Cholecalciferol (VITAMIN D3) 50 MCG (2000 UT) TABS Take 2,000 Units by mouth daily.     dorzolamide (TRUSOPT) 2 % ophthalmic solution Place 1 drop into both eyes 2 (two) times daily. 10 mL 2   Fluticasone-Umeclidin-Vilant (TRELEGY ELLIPTA) 100-62.5-25 MCG/ACT AEPB One inhalation a day in the morning 14 each 0   gabapentin (NEURONTIN) 100 MG capsule Take 1-3 capsules (100-300 mg total) by mouth up to every 8 (eight) hours as needed for leg and foot pain. 90 capsule 3   levothyroxine (SYNTHROID) 100 MCG tablet Take 1 tablet (100 mcg total) by mouth daily before breakfast. 90 tablet 3    losartan (COZAAR) 50 MG tablet Take 1 tablet (50 mg total) by mouth daily. 90 tablet 3   methocarbamol (ROBAXIN) 500 MG tablet Take 1.5 tablets (750 mg total) by mouth every 8 (eight) hours as needed for muscle spasms (back pain). 45 tablet 2   methylPREDNISolone (MEDROL DOSEPAK) 4 MG TBPK tablet Take as directed 21 tablet 0   traMADol (ULTRAM) 50 MG tablet Take 50 mg by mouth as needed.     No current facility-administered medications for this visit.    PHYSICAL EXAM Vitals:   11/23/23 1246  BP: 138/73  Pulse: 76  Temp: 98.4 F (36.9 C)  TempSrc: Temporal  SpO2: 91%  Weight: 164 lb 6.4 oz (74.6 kg)  Height: 5\' 5"  (1.651 m)    Well appearing elderly woman in no distress Regular rate and rhythm Unlabored breathing No palpable pedal pulses Benign abdomen   PERTINENT LABORATORY AND RADIOLOGIC DATA  Most recent CBC    Latest Ref Rng & Units 11/01/2023    2:12 PM 10/19/2023   12:01 PM 09/24/2023   12:33 PM  CBC  WBC 3.4 - 10.8 x10E3/uL 8.1  7.9  8.4   Hemoglobin 11.1 - 15.9 g/dL 62.1  9.2  9.6   Hematocrit 34.0 - 46.6 % 34.4  31.3  31.9   Platelets 150 - 450 x10E3/uL 315  262  326      Most recent CMP    Latest Ref Rng & Units 09/24/2023   12:33 PM 02/25/2023    3:01 PM 07/14/2022    1:01 PM  CMP  Glucose 70 - 99 mg/dL 75  89  78   BUN 8 - 27 mg/dL 14  13  13    Creatinine 0.57 - 1.00 mg/dL 3.08  6.57  8.46   Sodium 134 - 144 mmol/L 140  142  138   Potassium 3.5 - 5.2 mmol/L 5.1  5.4  4.6   Chloride 96 - 106 mmol/L 101  104  98   CO2 20 - 29 mmol/L 27  26  24    Calcium 8.7 - 10.3 mg/dL 9.6  9.7  9.7   Total Protein 6.0 - 8.5 g/dL 7.0  7.0  7.4   Total Bilirubin 0.0 - 1.2 mg/dL 0.3  0.3  0.4   Alkaline Phos 44 - 121 IU/L 85  71  84   AST 0 - 40 IU/L 16  18  21    ALT 0 - 32 IU/L 9  12  12      Renal function CrCl cannot be calculated (Patient's most recent lab result is older than the maximum 21 days allowed.).  Hgb A1c MFr Bld (%)  Date Value  09/24/2023 5.8 (H)     LDL Chol Calc (NIH)  Date Value Ref Range Status  02/25/2023 81 0 - 99 mg/dL Final    CT angiogram of abdomen and pelvis 70% proximal SMA artery stenosis at the origin  Rande Brunt. Lenell Antu, MD FACS [facility] Phone Number: [phone] 11/23/2023 3:19 PM   Total time spent on preparing this encounter including chart review, data review, collecting history, examining the patient, coordinating care for this new patient, 60 minutes.  Portions of this report may have been transcribed using voice recognition software.  Every effort has been made to ensure accuracy; however, inadvertent computerized transcription errors may still be present.

## 2023-11-23 ENCOUNTER — Encounter: Payer: Self-pay | Admitting: Vascular Surgery

## 2023-11-23 ENCOUNTER — Ambulatory Visit (INDEPENDENT_AMBULATORY_CARE_PROVIDER_SITE_OTHER): Payer: Medicare Other | Admitting: Vascular Surgery

## 2023-11-23 VITALS — BP 138/73 | HR 76 | Temp 98.4°F | Ht 65.0 in | Wt 164.4 lb

## 2023-11-23 DIAGNOSIS — K551 Chronic vascular disorders of intestine: Secondary | ICD-10-CM | POA: Diagnosis not present

## 2023-11-25 ENCOUNTER — Other Ambulatory Visit (HOSPITAL_BASED_OUTPATIENT_CLINIC_OR_DEPARTMENT_OTHER): Payer: Self-pay | Admitting: Family Medicine

## 2023-11-25 DIAGNOSIS — E039 Hypothyroidism, unspecified: Secondary | ICD-10-CM

## 2023-12-02 ENCOUNTER — Other Ambulatory Visit: Payer: Medicare Other

## 2023-12-02 DIAGNOSIS — M4807 Spinal stenosis, lumbosacral region: Secondary | ICD-10-CM | POA: Diagnosis not present

## 2023-12-02 DIAGNOSIS — M47816 Spondylosis without myelopathy or radiculopathy, lumbar region: Secondary | ICD-10-CM | POA: Diagnosis not present

## 2023-12-02 DIAGNOSIS — D649 Anemia, unspecified: Secondary | ICD-10-CM | POA: Diagnosis not present

## 2023-12-02 DIAGNOSIS — M4187 Other forms of scoliosis, lumbosacral region: Secondary | ICD-10-CM | POA: Diagnosis not present

## 2023-12-02 DIAGNOSIS — M48061 Spinal stenosis, lumbar region without neurogenic claudication: Secondary | ICD-10-CM | POA: Diagnosis not present

## 2023-12-03 LAB — CBC WITH DIFFERENTIAL/PLATELET
Basophils Absolute: 0.1 10*3/uL (ref 0.0–0.2)
Basos: 1 %
EOS (ABSOLUTE): 0.2 10*3/uL (ref 0.0–0.4)
Eos: 3 %
Hematocrit: 31 % — ABNORMAL LOW (ref 34.0–46.6)
Hemoglobin: 9.5 g/dL — ABNORMAL LOW (ref 11.1–15.9)
Immature Grans (Abs): 0 10*3/uL (ref 0.0–0.1)
Immature Granulocytes: 0 %
Lymphocytes Absolute: 1.7 10*3/uL (ref 0.7–3.1)
Lymphs: 26 %
MCH: 24.8 pg — ABNORMAL LOW (ref 26.6–33.0)
MCHC: 30.6 g/dL — ABNORMAL LOW (ref 31.5–35.7)
MCV: 81 fL (ref 79–97)
Monocytes Absolute: 0.6 10*3/uL (ref 0.1–0.9)
Monocytes: 9 %
Neutrophils Absolute: 4.1 10*3/uL (ref 1.4–7.0)
Neutrophils: 61 %
Platelets: 298 10*3/uL (ref 150–450)
RBC: 3.83 x10E6/uL (ref 3.77–5.28)
RDW: 15.5 % — ABNORMAL HIGH (ref 11.7–15.4)
WBC: 6.7 10*3/uL (ref 3.4–10.8)

## 2023-12-03 LAB — IRON,TIBC AND FERRITIN PANEL
Ferritin: 13 ng/mL — ABNORMAL LOW (ref 15–150)
Iron Saturation: 5 % — CL (ref 15–55)
Iron: 22 ug/dL — ABNORMAL LOW (ref 27–139)
Total Iron Binding Capacity: 403 ug/dL (ref 250–450)
UIBC: 381 ug/dL — ABNORMAL HIGH (ref 118–369)

## 2023-12-10 ENCOUNTER — Non-Acute Institutional Stay (HOSPITAL_COMMUNITY)
Admission: RE | Admit: 2023-12-10 | Discharge: 2023-12-10 | Disposition: A | Discharge status: Home / Self Care | Source: Ambulatory Visit | Attending: Internal Medicine | Admitting: Internal Medicine

## 2023-12-10 DIAGNOSIS — E611 Iron deficiency: Secondary | ICD-10-CM | POA: Diagnosis present

## 2023-12-10 MED ORDER — SODIUM CHLORIDE 0.9 % IV SOLN: INTRAVENOUS | Status: DC | PRN

## 2023-12-10 MED ORDER — SODIUM CHLORIDE 0.9 % IV SOLN
510.0000 mg | Freq: Once | INTRAVENOUS | Status: AC
  Administered 2023-12-10: 510 mg via INTRAVENOUS
  Filled 2023-12-10: qty 17

## 2023-12-10 NOTE — Progress Notes (Signed)
 PATIENT CARE CENTER NOTE    Diagnosis: E61.1   Provider: Enid Skeens, MD   Procedure: Feraheme infusion    Note: Patient received Feraheme infusion (dose # 1 of 1) via PIV. Patient tolerated infusion well with no adverse reaction. BP elevated pre (147/62) and post (152/65) infusion but stable.  Patient observed for 30 minutes post infusion. Discharge instructions given. Patient alert, oriented and ambulatory at discharge.  Patient discharged home with daughter.

## 2023-12-13 DIAGNOSIS — M47816 Spondylosis without myelopathy or radiculopathy, lumbar region: Secondary | ICD-10-CM | POA: Diagnosis not present

## 2023-12-17 ENCOUNTER — Ambulatory Visit (HOSPITAL_BASED_OUTPATIENT_CLINIC_OR_DEPARTMENT_OTHER): Payer: Medicare Other | Admitting: Family

## 2023-12-20 ENCOUNTER — Other Ambulatory Visit: Payer: Self-pay | Admitting: Nurse Practitioner

## 2023-12-20 DIAGNOSIS — Z1231 Encounter for screening mammogram for malignant neoplasm of breast: Secondary | ICD-10-CM

## 2023-12-24 DIAGNOSIS — J449 Chronic obstructive pulmonary disease, unspecified: Secondary | ICD-10-CM | POA: Diagnosis not present

## 2023-12-24 DIAGNOSIS — I701 Atherosclerosis of renal artery: Secondary | ICD-10-CM | POA: Diagnosis not present

## 2023-12-24 DIAGNOSIS — M47816 Spondylosis without myelopathy or radiculopathy, lumbar region: Secondary | ICD-10-CM | POA: Diagnosis not present

## 2023-12-24 DIAGNOSIS — H409 Unspecified glaucoma: Secondary | ICD-10-CM | POA: Diagnosis not present

## 2023-12-24 DIAGNOSIS — E0590 Thyrotoxicosis, unspecified without thyrotoxic crisis or storm: Secondary | ICD-10-CM | POA: Diagnosis not present

## 2023-12-24 DIAGNOSIS — Z9181 History of falling: Secondary | ICD-10-CM | POA: Diagnosis not present

## 2023-12-24 DIAGNOSIS — I700 Atherosclerosis of aorta: Secondary | ICD-10-CM | POA: Diagnosis not present

## 2023-12-24 DIAGNOSIS — I10 Essential (primary) hypertension: Secondary | ICD-10-CM | POA: Diagnosis not present

## 2023-12-24 DIAGNOSIS — K8020 Calculus of gallbladder without cholecystitis without obstruction: Secondary | ICD-10-CM | POA: Diagnosis not present

## 2023-12-24 DIAGNOSIS — R350 Frequency of micturition: Secondary | ICD-10-CM | POA: Diagnosis not present

## 2023-12-24 DIAGNOSIS — Z87891 Personal history of nicotine dependence: Secondary | ICD-10-CM | POA: Diagnosis not present

## 2023-12-24 DIAGNOSIS — M48061 Spinal stenosis, lumbar region without neurogenic claudication: Secondary | ICD-10-CM | POA: Diagnosis not present

## 2023-12-27 DIAGNOSIS — J449 Chronic obstructive pulmonary disease, unspecified: Secondary | ICD-10-CM | POA: Diagnosis not present

## 2023-12-27 DIAGNOSIS — M47816 Spondylosis without myelopathy or radiculopathy, lumbar region: Secondary | ICD-10-CM | POA: Diagnosis not present

## 2023-12-27 DIAGNOSIS — I700 Atherosclerosis of aorta: Secondary | ICD-10-CM | POA: Diagnosis not present

## 2023-12-27 DIAGNOSIS — I701 Atherosclerosis of renal artery: Secondary | ICD-10-CM | POA: Diagnosis not present

## 2023-12-27 DIAGNOSIS — I10 Essential (primary) hypertension: Secondary | ICD-10-CM | POA: Diagnosis not present

## 2023-12-27 DIAGNOSIS — M48061 Spinal stenosis, lumbar region without neurogenic claudication: Secondary | ICD-10-CM | POA: Diagnosis not present

## 2023-12-28 ENCOUNTER — Other Ambulatory Visit: Payer: Self-pay | Admitting: Nurse Practitioner

## 2023-12-28 DIAGNOSIS — E611 Iron deficiency: Secondary | ICD-10-CM

## 2023-12-29 DIAGNOSIS — M47816 Spondylosis without myelopathy or radiculopathy, lumbar region: Secondary | ICD-10-CM | POA: Diagnosis not present

## 2023-12-29 DIAGNOSIS — J449 Chronic obstructive pulmonary disease, unspecified: Secondary | ICD-10-CM | POA: Diagnosis not present

## 2023-12-29 DIAGNOSIS — I701 Atherosclerosis of renal artery: Secondary | ICD-10-CM | POA: Diagnosis not present

## 2023-12-29 DIAGNOSIS — M48061 Spinal stenosis, lumbar region without neurogenic claudication: Secondary | ICD-10-CM | POA: Diagnosis not present

## 2023-12-29 DIAGNOSIS — I700 Atherosclerosis of aorta: Secondary | ICD-10-CM | POA: Diagnosis not present

## 2023-12-29 DIAGNOSIS — I10 Essential (primary) hypertension: Secondary | ICD-10-CM | POA: Diagnosis not present

## 2024-01-03 ENCOUNTER — Other Ambulatory Visit (HOSPITAL_BASED_OUTPATIENT_CLINIC_OR_DEPARTMENT_OTHER): Payer: Self-pay

## 2024-01-03 ENCOUNTER — Other Ambulatory Visit: Payer: Self-pay | Admitting: Nurse Practitioner

## 2024-01-03 ENCOUNTER — Other Ambulatory Visit: Payer: Self-pay

## 2024-01-03 ENCOUNTER — Ambulatory Visit (HOSPITAL_BASED_OUTPATIENT_CLINIC_OR_DEPARTMENT_OTHER): Payer: Medicare Other | Admitting: Cardiology

## 2024-01-03 DIAGNOSIS — J449 Chronic obstructive pulmonary disease, unspecified: Secondary | ICD-10-CM | POA: Diagnosis not present

## 2024-01-03 DIAGNOSIS — M48061 Spinal stenosis, lumbar region without neurogenic claudication: Secondary | ICD-10-CM | POA: Diagnosis not present

## 2024-01-03 DIAGNOSIS — I701 Atherosclerosis of renal artery: Secondary | ICD-10-CM | POA: Diagnosis not present

## 2024-01-03 DIAGNOSIS — M47816 Spondylosis without myelopathy or radiculopathy, lumbar region: Secondary | ICD-10-CM | POA: Diagnosis not present

## 2024-01-03 DIAGNOSIS — I700 Atherosclerosis of aorta: Secondary | ICD-10-CM | POA: Diagnosis not present

## 2024-01-03 DIAGNOSIS — I10 Essential (primary) hypertension: Secondary | ICD-10-CM | POA: Diagnosis not present

## 2024-01-03 DIAGNOSIS — M545 Low back pain, unspecified: Secondary | ICD-10-CM

## 2024-01-03 MED ORDER — DORZOLAMIDE HCL 2 % OP SOLN
1.0000 [drp] | Freq: Two times a day (BID) | OPHTHALMIC | 3 refills | Status: DC
  Filled 2024-01-03: qty 10, 100d supply, fill #0

## 2024-01-04 ENCOUNTER — Other Ambulatory Visit (HOSPITAL_BASED_OUTPATIENT_CLINIC_OR_DEPARTMENT_OTHER): Payer: Self-pay

## 2024-01-04 MED ORDER — TRAMADOL HCL 50 MG PO TABS
50.0000 mg | ORAL_TABLET | Freq: Three times a day (TID) | ORAL | 0 refills | Status: DC | PRN
  Filled 2024-01-04: qty 20, 7d supply, fill #0

## 2024-01-06 DIAGNOSIS — I701 Atherosclerosis of renal artery: Secondary | ICD-10-CM | POA: Diagnosis not present

## 2024-01-06 DIAGNOSIS — I700 Atherosclerosis of aorta: Secondary | ICD-10-CM | POA: Diagnosis not present

## 2024-01-06 DIAGNOSIS — J449 Chronic obstructive pulmonary disease, unspecified: Secondary | ICD-10-CM | POA: Diagnosis not present

## 2024-01-06 DIAGNOSIS — I10 Essential (primary) hypertension: Secondary | ICD-10-CM | POA: Diagnosis not present

## 2024-01-06 DIAGNOSIS — M47816 Spondylosis without myelopathy or radiculopathy, lumbar region: Secondary | ICD-10-CM | POA: Diagnosis not present

## 2024-01-06 DIAGNOSIS — M48061 Spinal stenosis, lumbar region without neurogenic claudication: Secondary | ICD-10-CM | POA: Diagnosis not present

## 2024-01-07 ENCOUNTER — Ambulatory Visit
Admission: RE | Admit: 2024-01-07 | Discharge: 2024-01-07 | Disposition: A | Discharge status: Home / Self Care | Source: Ambulatory Visit | Attending: Nurse Practitioner | Admitting: Nurse Practitioner

## 2024-01-07 DIAGNOSIS — Z1231 Encounter for screening mammogram for malignant neoplasm of breast: Secondary | ICD-10-CM | POA: Diagnosis not present

## 2024-01-10 DIAGNOSIS — I700 Atherosclerosis of aorta: Secondary | ICD-10-CM | POA: Diagnosis not present

## 2024-01-10 DIAGNOSIS — I10 Essential (primary) hypertension: Secondary | ICD-10-CM | POA: Diagnosis not present

## 2024-01-10 DIAGNOSIS — J449 Chronic obstructive pulmonary disease, unspecified: Secondary | ICD-10-CM | POA: Diagnosis not present

## 2024-01-10 DIAGNOSIS — M48061 Spinal stenosis, lumbar region without neurogenic claudication: Secondary | ICD-10-CM | POA: Diagnosis not present

## 2024-01-10 DIAGNOSIS — I701 Atherosclerosis of renal artery: Secondary | ICD-10-CM | POA: Diagnosis not present

## 2024-01-10 DIAGNOSIS — M47816 Spondylosis without myelopathy or radiculopathy, lumbar region: Secondary | ICD-10-CM | POA: Diagnosis not present

## 2024-01-12 DIAGNOSIS — M47816 Spondylosis without myelopathy or radiculopathy, lumbar region: Secondary | ICD-10-CM | POA: Diagnosis not present

## 2024-01-12 DIAGNOSIS — I701 Atherosclerosis of renal artery: Secondary | ICD-10-CM | POA: Diagnosis not present

## 2024-01-12 DIAGNOSIS — M48061 Spinal stenosis, lumbar region without neurogenic claudication: Secondary | ICD-10-CM | POA: Diagnosis not present

## 2024-01-12 DIAGNOSIS — I700 Atherosclerosis of aorta: Secondary | ICD-10-CM | POA: Diagnosis not present

## 2024-01-12 DIAGNOSIS — J449 Chronic obstructive pulmonary disease, unspecified: Secondary | ICD-10-CM | POA: Diagnosis not present

## 2024-01-12 DIAGNOSIS — I10 Essential (primary) hypertension: Secondary | ICD-10-CM | POA: Diagnosis not present

## 2024-01-13 ENCOUNTER — Encounter: Payer: Self-pay | Admitting: Nurse Practitioner

## 2024-01-18 DIAGNOSIS — I10 Essential (primary) hypertension: Secondary | ICD-10-CM | POA: Diagnosis not present

## 2024-01-18 DIAGNOSIS — J449 Chronic obstructive pulmonary disease, unspecified: Secondary | ICD-10-CM | POA: Diagnosis not present

## 2024-01-18 DIAGNOSIS — I701 Atherosclerosis of renal artery: Secondary | ICD-10-CM | POA: Diagnosis not present

## 2024-01-18 DIAGNOSIS — I700 Atherosclerosis of aorta: Secondary | ICD-10-CM | POA: Diagnosis not present

## 2024-01-18 DIAGNOSIS — M47816 Spondylosis without myelopathy or radiculopathy, lumbar region: Secondary | ICD-10-CM | POA: Diagnosis not present

## 2024-01-18 DIAGNOSIS — M48061 Spinal stenosis, lumbar region without neurogenic claudication: Secondary | ICD-10-CM | POA: Diagnosis not present

## 2024-02-21 ENCOUNTER — Other Ambulatory Visit (HOSPITAL_BASED_OUTPATIENT_CLINIC_OR_DEPARTMENT_OTHER): Payer: Self-pay

## 2024-03-06 ENCOUNTER — Other Ambulatory Visit (HOSPITAL_BASED_OUTPATIENT_CLINIC_OR_DEPARTMENT_OTHER): Payer: Self-pay

## 2024-04-04 ENCOUNTER — Other Ambulatory Visit (HOSPITAL_BASED_OUTPATIENT_CLINIC_OR_DEPARTMENT_OTHER): Payer: Self-pay

## 2024-04-14 ENCOUNTER — Other Ambulatory Visit (HOSPITAL_BASED_OUTPATIENT_CLINIC_OR_DEPARTMENT_OTHER): Payer: Self-pay

## 2024-04-14 DIAGNOSIS — H40053 Ocular hypertension, bilateral: Secondary | ICD-10-CM | POA: Diagnosis not present

## 2024-04-14 MED ORDER — DORZOLAMIDE HCL 2 % OP SOLN
1.0000 [drp] | Freq: Two times a day (BID) | OPHTHALMIC | 6 refills | Status: AC
  Filled 2024-04-14: qty 10, 50d supply, fill #0

## 2024-04-17 ENCOUNTER — Ambulatory Visit (INDEPENDENT_AMBULATORY_CARE_PROVIDER_SITE_OTHER): Payer: Medicare Other | Admitting: Nurse Practitioner

## 2024-04-17 ENCOUNTER — Encounter: Payer: Self-pay | Admitting: Nurse Practitioner

## 2024-04-17 ENCOUNTER — Other Ambulatory Visit (HOSPITAL_BASED_OUTPATIENT_CLINIC_OR_DEPARTMENT_OTHER): Payer: Self-pay

## 2024-04-17 VITALS — BP 122/78 | HR 67 | Wt 159.0 lb

## 2024-04-17 DIAGNOSIS — G63 Polyneuropathy in diseases classified elsewhere: Secondary | ICD-10-CM | POA: Diagnosis not present

## 2024-04-17 DIAGNOSIS — E079 Disorder of thyroid, unspecified: Secondary | ICD-10-CM

## 2024-04-17 DIAGNOSIS — D509 Iron deficiency anemia, unspecified: Secondary | ICD-10-CM

## 2024-04-17 DIAGNOSIS — R202 Paresthesia of skin: Secondary | ICD-10-CM | POA: Diagnosis not present

## 2024-04-17 DIAGNOSIS — M7522 Bicipital tendinitis, left shoulder: Secondary | ICD-10-CM | POA: Diagnosis not present

## 2024-04-17 DIAGNOSIS — R531 Weakness: Secondary | ICD-10-CM | POA: Diagnosis not present

## 2024-04-17 DIAGNOSIS — I10 Essential (primary) hypertension: Secondary | ICD-10-CM

## 2024-04-17 DIAGNOSIS — E039 Hypothyroidism, unspecified: Secondary | ICD-10-CM

## 2024-04-17 HISTORY — DX: Bicipital tendinitis, left shoulder: M75.22

## 2024-04-17 MED ORDER — LEVOTHYROXINE SODIUM 100 MCG PO TABS: 100.0000 ug | ORAL_TABLET | Freq: Every day | ORAL | 3 refills | Status: AC

## 2024-04-17 NOTE — Assessment & Plan Note (Signed)
 She reports shoulder pain, likely due to arthritis or strain from using the arm to pull herself up stairs. The pain is located in the bicep tendon area and may be exacerbated by her method of ascending stairs. - Advise against carrying items while using stairs - Stretches provided on AVS

## 2024-04-17 NOTE — Assessment & Plan Note (Addendum)
 Managed with levothyroxine . Needs script sent to [facility] for cost effectiveness. No symptoms present at this time. Labs have been stable with no changes.

## 2024-04-17 NOTE — Patient Instructions (Addendum)
 I have sent the levothyroxine  to [facility] for you. If you need me to change any of the other medications, please let me know.   I am going to check your iron levels and B12 levels again to see how these are. If these are low they can sometimes make the neuropathy worse and will definitely make your weakness worse.   I am concerned as to why your blood counts are dropping so much. I have looked back through previous imaging and didn't see anything. We may need further testing to see what is causing this, if you wish to have this done.    Peripheral Neuropathy Peripheral neuropathy is a type of nerve damage. It affects nerves that carry signals between the spinal cord and the arms, legs, and the rest of the body (peripheral nerves). It does not affect nerves in the spinal cord or brain. In peripheral neuropathy, one nerve or a group of nerves may be damaged. Peripheral neuropathy is a broad category that includes many specific nerve disorders, like diabetic neuropathy, hereditary neuropathy, and carpal tunnel syndrome. What are the causes? This condition may be caused by: Certain diseases, such as: Diabetes. This is the most common cause of peripheral neuropathy. Autoimmune diseases, such as rheumatoid arthritis and systemic lupus erythematosus. Nerve diseases that are passed from parent to child (inherited). Kidney disease. Thyroid  disease. Other causes may include: Nerve injury. Pressure or stress on a nerve that lasts a long time. Lack (deficiency) of B vitamins. This can result from alcoholism, poor diet, or a restricted diet. Infections. Some medicines, such as cancer medicines (chemotherapy). Poisonous (toxic) substances, such as lead and mercury. Too little blood flowing to the legs. In some cases, the cause of this condition is not known. What are the signs or symptoms? Symptoms of this condition depend on which of your nerves is damaged. Symptoms in the legs, hands, and arms can  include: Loss of feeling (numbness) in the feet, hands, or both. Tingling in the feet, hands, or both. Burning pain. Very sensitive skin. Weakness. Not being able to move a part of the body (paralysis). Clumsiness or poor coordination. Muscle twitching. Loss of balance. Symptoms in other parts of the body can include: Not being able to control your bladder. Feeling dizzy. Sexual problems. How is this diagnosed? Diagnosing and finding the cause of peripheral neuropathy can be difficult. Your health care provider will take your medical history and do a physical exam. A neurological exam will also be done. This involves checking things that are affected by your brain, spinal cord, and nerves (nervous system). For example, your health care provider will check your reflexes, how you move, and what you can feel. You may have other tests, such as: Blood tests. Electromyogram (EMG) and nerve conduction tests. These tests check nerve function and how well the nerves are controlling the muscles. Imaging tests, such as a CT scan or MRI, to rule out other causes of your symptoms. Removing a small piece of nerve to be examined in a lab (nerve biopsy). Removing and examining a small amount of the fluid that surrounds the brain and spinal cord (lumbar puncture). How is this treated? Treatment for this condition may involve: Treating the underlying cause of the neuropathy, such as diabetes, kidney disease, or vitamin deficiencies. Stopping medicines that can cause neuropathy, such as chemotherapy. Medicine to help relieve pain. Medicines may include: Prescription or over-the-counter pain medicine. Anti-seizure medicine. Antidepressants. Pain-relieving patches that are applied to painful areas of skin. Surgery  to relieve pressure on a nerve or to destroy a nerve that is causing pain. Physical therapy to help improve movement and balance. Devices to help you move around (assistive devices). Follow  these instructions at home: Medicines Take over-the-counter and prescription medicines only as told by your health care provider. Do not take any other medicines without first asking your health care provider. Ask your health care provider if the medicine prescribed to you requires you to avoid driving or using machinery. Lifestyle  Do not use any products that contain nicotine or tobacco. These products include cigarettes, chewing tobacco, and vaping devices, such as e-cigarettes. Smoking keeps blood from reaching damaged nerves. If you need help quitting, ask your health care provider. Avoid or limit alcohol. Too much alcohol can cause a vitamin B deficiency, and vitamin B is needed for healthy nerves. Eat a healthy diet. This includes: Eating foods that are high in fiber, such as beans, whole grains, and fresh fruits and vegetables. Limiting foods that are high in fat and processed sugars, such as fried or sweet foods. General instructions  If you have diabetes, work closely with your health care provider to keep your blood sugar under control. If you have numbness in your feet: Check every day for signs of injury or infection. Watch for redness, warmth, and swelling. Wear padded socks and comfortable shoes. These help protect your feet. Develop a good support system. Living with peripheral neuropathy can be stressful. Consider talking with a mental health specialist or joining a support group. Use assistive devices and attend physical therapy as told by your health care provider. This may include using a walker or a cane. Keep all follow-up visits. This is important. Where to find more information [employer] of Neurological Disorders: [employer] Contact a health care provider if: You have new signs or symptoms of peripheral neuropathy. You are struggling emotionally from dealing with peripheral neuropathy. Your pain is not well controlled. Get help right away if: You  have an injury or infection that is not healing normally. You develop new weakness in an arm or leg. You have fallen or do so frequently. Summary Peripheral neuropathy is when the nerves in the arms or legs are damaged, resulting in numbness, weakness, or pain. There are many causes of peripheral neuropathy, including diabetes, pinched nerves, vitamin deficiencies, autoimmune disease, and hereditary conditions. Diagnosing and finding the cause of peripheral neuropathy can be difficult. Your health care provider will take your medical history, do a physical exam, and do tests, including blood tests and nerve function tests. Treatment involves treating the underlying cause of the neuropathy and taking medicines to help control pain. Physical therapy and assistive devices may also help. This information is not intended to replace advice given to you by your health care provider. Make sure you discuss any questions you have with your health care provider. Document Revised: 05/13/2021 Document Reviewed: 05/13/2021 Elsevier Patient Education  2024 [employer].

## 2024-04-17 NOTE — Assessment & Plan Note (Signed)
 Likely related to iron deficiency anemia. Reports that there was not improvement with previous iron infusions. We will recheck the labs today. Recommend considering moving bedroom to first level to reduce the risks of falls on the stairs. Continue to use a cane for assistance. Consider using walker in the house, particularly when first standing to help prevent the risk of falls.

## 2024-04-17 NOTE — Assessment & Plan Note (Signed)
 She reports persistent weakness and dizziness with no significant improvement following an iron infusion. She is unable to tolerate oral iron. The iron deficiency is suspected to contribute to her symptoms. The cause of the deficiency remains unclear, necessitating further investigation to determine the source of blood loss. We discussed that some patients may require multiple iron infusions for significant improvement. I am concerned what could be causing the blood loss, considering she is so symptomatic with it. We will recheck today to determine where her levels are and make a joint decision on further work-up based on the labs.  - Order blood tests to check iron and B12 levels - Consider additional iron infusions if levels are low

## 2024-04-17 NOTE — Assessment & Plan Note (Signed)
 Well controlled at this time with no concerning findings. No changes to plan of care at this time. Continue losartan  50mg .

## 2024-04-17 NOTE — Progress Notes (Signed)
 Catheline Doing, DNP, AGNP-c [facility]  [address] [place], [place] [address] [phone]  ESTABLISHED PATIENT- Chronic Health and/or Follow-Up Visit  Blood pressure 122/78, pulse 67, weight 159 lb (72.1 kg).    Weakness, Pre-syncope, Anemia, Paresthesias, left shoulder  History of Present Illness Kathryn [name] is an 89 year old female with iron deficiency anemia who presents with persistent weakness and dizziness.  She experiences persistent weakness and dizziness, similar to previous episodes. She describes spells where she feels hot and as if she might pass out, requiring her to sit down until the feeling passes. These episodes leave her feeling 'blah' for the rest of the day and have occurred three times recently, which is new for her.  She recently underwent an iron infusion but did not notice any improvement in her symptoms, although her daughter observed a slight improvement post-infusion. There is concern about the cause of her low iron levels, and she has undergone various tests to determine the source of blood loss. Previously she did not wish for any further work-up to evaluate the cause of blood loss.   She uses a cane for stability due to feeling unsteady, describing her walk as 'like a drunk'. She has difficulty with steps and often needs to steady herself before moving. Her daughter suggests using a walker for additional support.  She experiences pain in her left shoulder, which she attributes to arthritis.  She also reports numbness and a 'rubbery' feeling in her legs, which she associates with neuropathy. She takes gabapentin  at night to manage these symptoms, which helps settle her legs before bed. Occasionally, she takes it during the day if her symptoms are severe.  She reports frequent urination at night, sometimes up to five or six times, which she attributes to fluid retention during the day. She has tried diuretics in the past without  significant improvement. She experiences cold intolerance, feeling cold most of the time. She also has some wax buildup in her ears, which occasionally causes a sensation of blockage. No changes in bowel habits, dark stools, or dark urine.   All ROS negative with exception of what is listed above.   PHYSICAL EXAM Physical Exam Vitals and nursing note reviewed.  Constitutional:      General: She is not in acute distress. HENT:     Head: Normocephalic.  Eyes:     Pupils: Pupils are equal, round, and reactive to light.  Neck:     Vascular: No carotid bruit.  Cardiovascular:     Rate and Rhythm: Normal rate and regular rhythm.     Pulses: Normal pulses.     Heart sounds: Normal heart sounds.  Pulmonary:     Effort: Pulmonary effort is normal.     Breath sounds: Wheezing present.  Abdominal:     General: Bowel sounds are normal. There is no distension.     Palpations: Abdomen is soft.     Tenderness: There is no abdominal tenderness. There is no guarding.  Musculoskeletal:     Right lower leg: Edema present.     Left lower leg: Edema present.  Lymphadenopathy:     Cervical: No cervical adenopathy.  Skin:    General: Skin is warm and dry.     Capillary Refill: Capillary refill takes less than 2 seconds.     Coloration: Skin is pale.  Neurological:     Mental Status: She is alert and oriented to person, place, and time.     Sensory: Sensory deficit  present.     Motor: Weakness present.     Gait: Gait abnormal.      PLAN Problem List Items Addressed This Visit     Benign essential hypertension   Well controlled at this time with no concerning findings. No changes to plan of care at this time. Continue losartan  50mg .       Hypothyroidism   Managed with levothyroxine . Needs script sent to [facility] for cost effectiveness. No symptoms present at this time.       Relevant Medications   levothyroxine  (SYNTHROID ) 100 MCG tablet   Anemia - Primary   She reports persistent  weakness and dizziness with no significant improvement following an iron infusion. She is unable to tolerate oral iron. The iron deficiency is suspected to contribute to her symptoms. The cause of the deficiency remains unclear, necessitating further investigation to determine the source of blood loss. We discussed that some patients may require multiple iron infusions for significant improvement. I am concerned what could be causing the blood loss, considering she is so symptomatic with it. We will recheck today to determine where her levels are and make a joint decision on further work-up based on the labs.  - Order blood tests to check iron and B12 levels - Consider additional iron infusions if levels are low      Relevant Orders   CBC with Differential/Platelet   CMP14+EGFR   Iron, TIBC and Ferritin Panel   Vitamin B12   Reticulocytes   Neuropathy associated with thyroid  disease (HCC)   She experiences numbness, tingling, and burning sensations in the legs and feet. Gabapentin  is used to manage symptoms, primarily at night to aid sleep, with occasional daytime use depending on severity. Scheduled dosing versus as-needed dosing was discussed, considering gabapentin 's side effect of drowsiness. - Continue gabapentin  as needed, primarily at night - Encourage leg exercises to maintain mobility       Relevant Medications   levothyroxine  (SYNTHROID ) 100 MCG tablet   Weakness   Likely related to iron deficiency anemia. Reports that there was not improvement with previous iron infusions. We will recheck the labs today. Recommend considering moving bedroom to first level to reduce the risks of falls on the stairs. Continue to use a cane for assistance. Consider using walker in the house, particularly when first standing to help prevent the risk of falls.       Relevant Orders   CBC with Differential/Platelet   CMP14+EGFR   Iron, TIBC and Ferritin Panel   Vitamin B12   Reticulocytes   Biceps  tendonitis on left   She reports shoulder pain, likely due to arthritis or strain from using the arm to pull herself up stairs. The pain is located in the bicep tendon area and may be exacerbated by her method of ascending stairs. - Advise against carrying items while using stairs - Stretches provided on AVS      Other Visit Diagnoses       Paresthesia of bilateral legs       Relevant Orders   CMP14+EGFR       Return in about 4 months (around 08/18/2024) for Med Management 30.  SaraBeth Labria Wos, DNP, AGNP-c Time: 44 minutes, >50% spent counseling, care coordination, chart review, and documentation.

## 2024-04-17 NOTE — Assessment & Plan Note (Signed)
 She experiences numbness, tingling, and burning sensations in the legs and feet. Gabapentin  is used to manage symptoms, primarily at night to aid sleep, with occasional daytime use depending on severity. Scheduled dosing versus as-needed dosing was discussed, considering gabapentin 's side effect of drowsiness. - Continue gabapentin  as needed, primarily at night - Encourage leg exercises to maintain mobility

## 2024-04-18 LAB — CMP14+EGFR
ALT: 10 IU/L (ref 0–32)
AST: 17 IU/L (ref 0–40)
Albumin: 4 g/dL (ref 3.7–4.7)
Alkaline Phosphatase: 69 IU/L (ref 44–121)
BUN/Creatinine Ratio: 19 (ref 12–28)
BUN: 13 mg/dL (ref 8–27)
Bilirubin Total: 0.3 mg/dL (ref 0.0–1.2)
CO2: 23 mmol/L (ref 20–29)
Calcium: 9.3 mg/dL (ref 8.7–10.3)
Chloride: 106 mmol/L (ref 96–106)
Creatinine, Ser: 0.68 mg/dL (ref 0.57–1.00)
Globulin, Total: 2.9 g/dL (ref 1.5–4.5)
Glucose: 80 mg/dL (ref 70–99)
Potassium: 4.3 mmol/L (ref 3.5–5.2)
Sodium: 142 mmol/L (ref 134–144)
Total Protein: 6.9 g/dL (ref 6.0–8.5)
eGFR: 84 mL/min/1.73 (ref >59)

## 2024-04-18 LAB — CBC WITH DIFFERENTIAL/PLATELET
Basophils Absolute: 0.1 x10E3/uL (ref 0.0–0.2)
Basos: 1 %
EOS (ABSOLUTE): 0.3 x10E3/uL (ref 0.0–0.4)
Eos: 4 %
Hematocrit: 30.7 % — ABNORMAL LOW (ref 34.0–46.6)
Hemoglobin: 9.3 g/dL — ABNORMAL LOW (ref 11.1–15.9)
Immature Grans (Abs): 0 x10E3/uL (ref 0.0–0.1)
Immature Granulocytes: 0 %
Lymphocytes Absolute: 2 x10E3/uL (ref 0.7–3.1)
Lymphs: 27 %
MCH: 25.4 pg — ABNORMAL LOW (ref 26.6–33.0)
MCHC: 30.3 g/dL — ABNORMAL LOW (ref 31.5–35.7)
MCV: 84 fL (ref 79–97)
Monocytes Absolute: 0.6 x10E3/uL (ref 0.1–0.9)
Monocytes: 8 %
Neutrophils Absolute: 4.3 x10E3/uL (ref 1.4–7.0)
Neutrophils: 60 %
Platelets: 239 x10E3/uL (ref 150–450)
RBC: 3.66 x10E6/uL — ABNORMAL LOW (ref 3.77–5.28)
RDW: 13.3 % (ref 11.7–15.4)
WBC: 7.2 x10E3/uL (ref 3.4–10.8)

## 2024-04-18 LAB — IRON,TIBC AND FERRITIN PANEL
Ferritin: 13 ng/mL — AB (ref 15–150)
Iron Saturation: 5 — AB (ref 15–55)
Iron: 19 ug/dL — AB (ref 27–139)
Total Iron Binding Capacity: 384 ug/dL (ref 250–450)
UIBC: 365 ug/dL (ref 118–369)

## 2024-04-18 LAB — VITAMIN B12: Vitamin B-12: >2000 pg/mL — AB (ref 232–1245)

## 2024-04-18 LAB — RETICULOCYTES: Retic Ct Pct: 1.1 % (ref 0.6–2.6)

## 2024-04-19 ENCOUNTER — Ambulatory Visit: Payer: Self-pay | Admitting: Nurse Practitioner

## 2024-04-19 ENCOUNTER — Telehealth: Payer: Self-pay

## 2024-04-19 NOTE — Telephone Encounter (Signed)
 Received request from [facility] on [facility] need a new prescription can be fax to [facility] to [phone].

## 2024-04-20 ENCOUNTER — Other Ambulatory Visit: Payer: Self-pay

## 2024-04-20 DIAGNOSIS — D509 Iron deficiency anemia, unspecified: Secondary | ICD-10-CM

## 2024-04-20 DIAGNOSIS — D649 Anemia, unspecified: Secondary | ICD-10-CM

## 2024-04-20 DIAGNOSIS — E611 Iron deficiency: Secondary | ICD-10-CM

## 2024-04-21 ENCOUNTER — Telehealth: Payer: Self-pay

## 2024-04-21 ENCOUNTER — Other Ambulatory Visit: Payer: Self-pay

## 2024-04-21 ENCOUNTER — Other Ambulatory Visit: Payer: Self-pay | Admitting: Family Medicine

## 2024-04-21 DIAGNOSIS — D509 Iron deficiency anemia, unspecified: Secondary | ICD-10-CM | POA: Insufficient documentation

## 2024-04-21 NOTE — Telephone Encounter (Signed)
 Dr. Joyce, patient will be scheduled as soon as possible.  Auth Submission: NO AUTH NEEDED Site of care: Site of care: [facility] Payer: Medicare A/B with BCBS supplement Medication & CPT/J Code(s) submitted: Feraheme (ferumoxytol ) [id number] Diagnosis Code:  Route of submission (phone, fax, portal):  Phone # Fax # Auth type: Buy/Bill PB Units/visits requested: 510mg  x 2 doses Reference number:  Approval from: 04/21/24 to 08/21/24

## 2024-04-21 NOTE — Telephone Encounter (Signed)
 Patient referred to infusion pharmacy team for ambulatory infusion of IV iron.  Insurance - Medicare Site of care - Site of care: [facility] Dx code - D50.9 IV Iron Therapy - Feraheme 510 mg x 2 Infusion appointments - Scheduling team will schedule patient as soon as possible.    Norton Blush, PharmD Pharmacist [facility]

## 2024-04-25 ENCOUNTER — Telehealth: Payer: Self-pay

## 2024-04-25 NOTE — Telephone Encounter (Signed)
 Received request for a new rx from [facility] on [facility]  can be fax to [facility] AT [phone].

## 2024-04-26 MED ORDER — BREZTRI AEROSPHERE 160-9-4.8 MCG/ACT IN AERO: 2.0000 | INHALATION_SPRAY | Freq: Two times a day (BID) | RESPIRATORY_TRACT | 1 refills | Status: DC

## 2024-04-26 NOTE — Addendum Note (Signed)
 Addended by: LIONELL JON DEL on: 04/26/2024 08:10 AM   Modules accepted: Orders

## 2024-04-28 ENCOUNTER — Ambulatory Visit

## 2024-04-28 VITALS — BP 156/81 | HR 56 | Temp 98.2°F | Resp 20 | Ht 65.0 in | Wt 159.6 lb

## 2024-04-28 DIAGNOSIS — D509 Iron deficiency anemia, unspecified: Secondary | ICD-10-CM

## 2024-04-28 MED ORDER — SODIUM CHLORIDE 0.9 % IV SOLN
510.0000 mg | Freq: Once | INTRAVENOUS | Status: AC
Start: 2024-04-28 — End: 2024-04-28
  Administered 2024-04-28: 510 mg via INTRAVENOUS
  Filled 2024-04-28: qty 17

## 2024-04-28 NOTE — Progress Notes (Signed)
 Diagnosis: Iron Deficiency Anemia  Provider:  Praveen Mannam MD  Procedure: IV Infusion  IV Type: Peripheral, IV Location: R Antecubital  Feraheme (Ferumoxytol ), Dose: 510 mg  Infusion Start Time: 1023  Infusion Stop Time: 1042  Post Infusion IV Care: Observation period completed and Peripheral IV Discontinued  Discharge: Condition: Good, Destination: Home . AVS Provided and AVS Declined  Performed by:  Skya Mccullum, RN

## 2024-04-28 NOTE — Patient Instructions (Signed)

## 2024-05-05 ENCOUNTER — Ambulatory Visit

## 2024-05-05 MED ORDER — FERUMOXYTOL INJECTION 510 MG/17 ML
510.0000 mg | Freq: Once | INTRAVENOUS | Status: DC
  Filled 2024-05-05: qty 17

## 2024-05-08 ENCOUNTER — Ambulatory Visit (INDEPENDENT_AMBULATORY_CARE_PROVIDER_SITE_OTHER)

## 2024-05-08 VITALS — BP 162/75 | HR 56 | Temp 98.0°F | Resp 14 | Ht 65.0 in | Wt 158.6 lb

## 2024-05-08 DIAGNOSIS — D509 Iron deficiency anemia, unspecified: Secondary | ICD-10-CM | POA: Diagnosis not present

## 2024-05-08 MED ORDER — SODIUM CHLORIDE 0.9 % IV SOLN
510.0000 mg | Freq: Once | INTRAVENOUS | Status: AC
  Administered 2024-05-08: 510 mg via INTRAVENOUS
  Filled 2024-05-08: qty 17

## 2024-05-08 NOTE — Progress Notes (Signed)
 Diagnosis: Iron Deficiency Anemia  Provider:  Praveen Mannam MD  Procedure: IV Infusion  IV Type: Peripheral, IV Location: R Forearm  Feraheme (Ferumoxytol ), Dose: 510 mg  Infusion Start Time: 1157  Infusion Stop Time: 1213  Post Infusion IV Care: Observation period completed and Peripheral IV Discontinued  Discharge: Condition: Good, Destination: Home . AVS Declined  Performed by:  Porfiria Heinrich, RN

## 2024-05-15 ENCOUNTER — Other Ambulatory Visit: Payer: Self-pay | Admitting: Nurse Practitioner

## 2024-05-15 DIAGNOSIS — M545 Low back pain, unspecified: Secondary | ICD-10-CM

## 2024-05-15 MED ORDER — TRAMADOL HCL 50 MG PO TABS
50.0000 mg | ORAL_TABLET | Freq: Three times a day (TID) | ORAL | 0 refills | Status: AC | PRN
Start: 2024-05-15 — End: 2024-05-25
  Filled 2024-05-15: qty 20, 7d supply, fill #0

## 2024-05-15 NOTE — Telephone Encounter (Signed)
 Last apt 04/17/24.

## 2024-05-16 ENCOUNTER — Other Ambulatory Visit (HOSPITAL_BASED_OUTPATIENT_CLINIC_OR_DEPARTMENT_OTHER): Payer: Self-pay

## 2024-05-23 ENCOUNTER — Encounter: Payer: Self-pay | Admitting: Oncology

## 2024-05-23 ENCOUNTER — Inpatient Hospital Stay (HOSPITAL_BASED_OUTPATIENT_CLINIC_OR_DEPARTMENT_OTHER): Admitting: Oncology

## 2024-05-23 ENCOUNTER — Inpatient Hospital Stay: Attending: Oncology

## 2024-05-23 VITALS — BP 165/67 | HR 68 | Temp 97.7°F | Resp 18 | Ht 65.0 in | Wt 155.0 lb

## 2024-05-23 DIAGNOSIS — Z86711 Personal history of pulmonary embolism: Secondary | ICD-10-CM | POA: Diagnosis not present

## 2024-05-23 DIAGNOSIS — Z803 Family history of malignant neoplasm of breast: Secondary | ICD-10-CM | POA: Diagnosis not present

## 2024-05-23 DIAGNOSIS — J069 Acute upper respiratory infection, unspecified: Secondary | ICD-10-CM | POA: Insufficient documentation

## 2024-05-23 DIAGNOSIS — Z800 Family history of malignant neoplasm of digestive organs: Secondary | ICD-10-CM | POA: Insufficient documentation

## 2024-05-23 DIAGNOSIS — Z801 Family history of malignant neoplasm of trachea, bronchus and lung: Secondary | ICD-10-CM | POA: Diagnosis not present

## 2024-05-23 DIAGNOSIS — I10 Essential (primary) hypertension: Secondary | ICD-10-CM | POA: Insufficient documentation

## 2024-05-23 DIAGNOSIS — J449 Chronic obstructive pulmonary disease, unspecified: Secondary | ICD-10-CM | POA: Insufficient documentation

## 2024-05-23 DIAGNOSIS — Z87891 Personal history of nicotine dependence: Secondary | ICD-10-CM | POA: Diagnosis not present

## 2024-05-23 DIAGNOSIS — Z809 Family history of malignant neoplasm, unspecified: Secondary | ICD-10-CM | POA: Insufficient documentation

## 2024-05-23 DIAGNOSIS — Z808 Family history of malignant neoplasm of other organs or systems: Secondary | ICD-10-CM

## 2024-05-23 DIAGNOSIS — Z79899 Other long term (current) drug therapy: Secondary | ICD-10-CM | POA: Diagnosis not present

## 2024-05-23 DIAGNOSIS — D509 Iron deficiency anemia, unspecified: Secondary | ICD-10-CM | POA: Insufficient documentation

## 2024-05-23 LAB — CBC WITH DIFFERENTIAL (CANCER CENTER ONLY)
Abs Immature Granulocytes: 0.02 K/uL (ref 0.00–0.07)
Basophils Absolute: 0 K/uL (ref 0.0–0.1)
Basophils Relative: 1 %
Eosinophils Absolute: 0.2 K/uL (ref 0.0–0.5)
Eosinophils Relative: 3 %
HCT: 35.6 % — ABNORMAL LOW (ref 36.0–46.0)
Hemoglobin: 11.1 g/dL — ABNORMAL LOW (ref 12.0–15.0)
Immature Granulocytes: 0 %
Lymphocytes Relative: 36 %
Lymphs Abs: 2.2 K/uL (ref 0.7–4.0)
MCH: 26.6 pg (ref 26.0–34.0)
MCHC: 31.2 g/dL (ref 30.0–36.0)
MCV: 85.2 fL (ref 80.0–100.0)
Monocytes Absolute: 0.5 K/uL (ref 0.1–1.0)
Monocytes Relative: 9 %
Neutro Abs: 3.2 K/uL (ref 1.7–7.7)
Neutrophils Relative %: 51 %
Platelet Count: 189 K/uL (ref 150–400)
RBC: 4.18 MIL/uL (ref 3.87–5.11)
RDW: 21.4 % — ABNORMAL HIGH (ref 11.5–15.5)
WBC Count: 6.2 K/uL (ref 4.0–10.5)
nRBC: 0 % (ref 0.0–0.2)

## 2024-05-23 LAB — IRON AND TIBC
Iron: 42 ug/dL (ref 28–170)
Saturation Ratios: 13 % (ref 10.4–31.8)
TIBC: 326 ug/dL (ref 250–450)
UIBC: 284 ug/dL

## 2024-05-23 LAB — FERRITIN: Ferritin: 784 ng/mL — ABNORMAL HIGH (ref 11–307)

## 2024-05-23 LAB — FOLATE: Folate: 14.7 ng/mL (ref >5.9)

## 2024-05-23 NOTE — Assessment & Plan Note (Addendum)
 Chronic iron deficiency anemia since January 2025 with hemoglobin levels between 9-10 g/dL.   Received 2 doses of Feraheme in August 2025.  Energy levels remain low despite recent Feraheme infusions in August 2025, likely due to concurrent viral illness.  Family history of cancer noted, but no current evidence of malignancy on imaging in February 2025.  No concern for leukemias based on labs.   Absorption issues due to age may contribute to iron deficiency.  Occult blood loss is a possibility.  However she cannot undergo colonoscopy as she cannot tolerate prep.  Her PCP to consider stool test or Cologuard for further evaluation of potential gastrointestinal blood loss.  Labs today show improved hemoglobin of 11.1, MCV normal at 85.2.  White count and platelet count are within normal limits.  Iron studies pending.  It may take another month or 2 for optimal hemoglobin response to recent iron infusions.  - Check folic acid levels to rule out deficiency.  Will update patient, once iron labs are available.  Currently no additional workup or intervention planned from hematology standpoint.  Thank you for giving us  an opportunity to participate in her care.  Please reconsult us  as necessary.

## 2024-05-23 NOTE — Progress Notes (Signed)
 [facility]  HEMATOLOGY CLINIC CONSULTATION NOTE   PATIENT NAME: Kathryn  Kathryn [name]   MR#: [id number] DOB: [birth year]  DATE OF SERVICE: 05/23/2024  Patient Care Team: Early, Camie BRAVO, NP as PCP - General (Nurse Practitioner) Lonni Slain, MD as PCP - Cardiology (Cardiology)  REASON FOR CONSULTATION/ CHIEF COMPLAINT:  Evaluation of anemia.  ASSESSMENT & PLAN:   Kathryn  ADALIE [name] is a 89 y.o. lady with a past medical history of hypertension, hypothyroidism, remote history of PE in 2019, COPD, venous stasis of both lower extremities, was referred to our service for evaluation of iron deficiency anemia.    Iron deficiency anemia, unspecified Chronic iron deficiency anemia since January 2025 with hemoglobin levels between 9-10 g/dL.   Received 2 doses of Feraheme in August 2025.  Energy levels remain low despite recent Feraheme infusions in August 2025, likely due to concurrent viral illness.  Family history of cancer noted, but no current evidence of malignancy on imaging in February 2025.  No concern for leukemias based on labs.   Absorption issues due to age may contribute to iron deficiency.  Occult blood loss is a possibility.  However she cannot undergo colonoscopy as she cannot tolerate prep.  Her PCP to consider stool test or Cologuard for further evaluation of potential gastrointestinal blood loss.  Labs today show improved hemoglobin of 11.1, MCV normal at 85.2.  White count and platelet count are within normal limits.  Iron studies pending.  It may take another month or 2 for optimal hemoglobin response to recent iron infusions.  - Check folic acid levels to rule out deficiency.  Will update patient, once iron labs are available.  Currently no additional workup or intervention planned from hematology standpoint.  Thank you for giving us  an opportunity to participate in her care.  Please reconsult us  as necessary.  Acute viral upper respiratory  infection Recent onset of symptoms consistent with a viral upper respiratory infection, including head cold and congestion. Symptoms have persisted since recent iron infusions, potentially affecting energy levels and response to treatment. - Recommend symptomatic treatment with steam inhalation for congestion. - Monitor symptoms and allow viral infection to resolve naturally.  Since the cause of anemia seems to be obvious from iron deficiency, I am not pursuing extensive workup at this time.  If inadequate response to iron replacement is noted, we will pursue workup to rule out other etiologies.   I reviewed lab results and outside records for this visit and discussed relevant results with the patient. Diagnosis, plan of care and treatment options were also discussed in detail with the patient. Opportunity provided to ask questions and answers provided to her apparent satisfaction. Provided instructions to call our clinic with any problems, questions or concerns prior to return visit. I recommended to continue follow-up with PCP and sub-specialists. She verbalized understanding and agreed with the plan. No barriers to learning was detected.  Espen Bethel, MD [facility] - A DEPT OF [facility] [id number] [place] [address] Dept: [phone] Dept Fax: [phone]  05/23/2024 3:48 PM  HISTORY OF PRESENT ILLNESS:  Discussed the use of AI scribe software for clinical note transcription with the patient, who gave verbal consent to proceed.  History of Present Illness Kathryn  [name] [name] is an 89 year old female with iron deficiency anemia who presents for evaluation of persistent anemia. She is accompanied by her daughter, Kathryn [name]. She was referred by her primary  doctor for anemia issues.  She has experienced persistent anemia since January 2025, with hemoglobin levels ranging between 9 and 10. Low iron levels were noted  in February, March, and July 2025. Despite receiving three iron infusions, two of which were administered in August 2025, there has been no significant improvement in her symptoms.  No overt blood loss, such as melena, epistaxis, or gingival bleeding. Her diet remains largely unchanged, although she has been eating less due to a recent cold. Past medical evaluations include normal white blood cell and platelet counts, normal vitamin B12 levels, and normal kidney and liver function tests as of July 2025. She has not had any blood transfusions.  She experiences fatigue and weakness, which have been progressively worsening. She also has pain primarily due to neuropathy in her feet and legs, present for three to four years, and back pain attributed to arthritis. No unintentional weight loss or abdominal pain.  Her family history is significant for cancer, with her daughter having had colon cancer and her mother and another daughter having died of cancer.     MEDICAL HISTORY:  Past Medical History:  Diagnosis Date   Arthritis    Chronic pain of right knee 08/07/2016   COPD (chronic obstructive pulmonary disease) (HCC)    Dyspnea    Encounter for subsequent annual wellness visit (AWV) in Medicare patient 01/10/2021   Encounter to establish care 01/02/2021   Hypertension    Hypothyroidism    Neck pain 12/06/2018   Osteopenia    Pneumonia    Pulmonary embolism (HCC) 04/2018   Screening for osteoporosis 01/02/2021   Thyroid  disease    Venous stasis of both lower extremities 01/10/2021    SURGICAL HISTORY: Past Surgical History:  Procedure Laterality Date   APPENDECTOMY     EYE SURGERY     TONSILLECTOMY AND ADENOIDECTOMY     TOTAL KNEE ARTHROPLASTY Right 01/01/2020   Procedure: RIGHT TOTAL KNEE ARTHROPLASTY;  Surgeon: Jerri Kay HERO, MD;  Location: [facility] OR;  Service: Orthopedics;  Laterality: Right;   TUBAL LIGATION     TUBAL LIGATION Bilateral     SOCIAL HISTORY: She reports that she  quit smoking about 7 years ago. Her smoking use included cigarettes. She has never used smokeless tobacco. She reports that she does not drink alcohol and does not use drugs. Social History   Socioeconomic History   Marital status: Divorced    Spouse name: Not on file   Number of children: 5   Years of education: Not on file   Highest education level: Not on file  Occupational History   Not on file  Tobacco Use   Smoking status: Former    Current packs/day: 0.00    Types: Cigarettes    Quit date: 2018    Years since quitting: 7.6   Smokeless tobacco: Never  Vaping Use   Vaping status: Never Used  Substance and Sexual Activity   Alcohol use: Never   Drug use: Never   Sexual activity: Not Currently    Birth control/protection: Abstinence  Other Topics Concern   Not on file  Social History Narrative   Not on file   Social Drivers of Health   Financial Resource Strain: Low Risk  (01/12/2022)   Overall Financial Resource Strain (CARDIA)    Difficulty of Paying Living Expenses: Not hard at all  Food Insecurity: No Food Insecurity (05/23/2024)   Hunger Vital Sign    Worried About Running Out of Food in the Last Year:  Never true    Ran Out of Food in the Last Year: Never true  Transportation Needs: No Transportation Needs (05/23/2024)   PRAPARE - Administrator, Civil Service (Medical): No    Lack of Transportation (Non-Medical): No  Physical Activity: Inactive (01/12/2022)   Exercise Vital Sign    Days of Exercise per Week: 0 days    Minutes of Exercise per Session: 0 min  Stress: No Stress Concern Present (01/12/2022)   [employer] of Occupational Health - Occupational Stress Questionnaire    Feeling of Stress : Not at all  Social Connections: Socially Isolated (01/12/2022)   Social Connection and Isolation Panel    Frequency of Communication with Friends and Family: More than three times a week    Frequency of Social Gatherings with Friends and Family: More  than three times a week    Attends Religious Services: Never    Database administrator or Organizations: No    Attends Banker Meetings: Never    Marital Status: Divorced  Catering manager Violence: Not At Risk (05/23/2024)   Humiliation, Afraid, Rape, and Kick questionnaire    Fear of Current or Ex-Partner: No    Emotionally Abused: No    Physically Abused: No    Sexually Abused: No    FAMILY HISTORY: Family History  Problem Relation Age of Onset   Breast cancer Mother    Liver cancer Mother    Breast cancer Daughter 75   Colon cancer Daughter    Hypothyroidism Daughter    Lung cancer Daughter    Hypothyroidism Daughter    Hypothyroidism Daughter     ALLERGIES:  She is allergic to lisinopril.  MEDICATIONS:  Current Outpatient Medications  Medication Sig Dispense Refill   acetaminophen  (TYLENOL ) 500 MG tablet 1 tablet as needed     b complex vitamins capsule Take 1 capsule by mouth daily.     budesonide-glycopyrrolate-formoterol (BREZTRI  AEROSPHERE) 160-9-4.8 MCG/ACT AERO inhaler Inhale 2 puffs into the lungs 2 (two) times daily. 32.1 g 1   Cholecalciferol (VITAMIN D3) 50 MCG (2000 UT) TABS Take 2,000 Units by mouth daily.     dorzolamide  (TRUSOPT ) 2 % ophthalmic solution Place 1 drop into both eyes every 12 (twelve) hours. 10 mL 6   gabapentin  (NEURONTIN ) 100 MG capsule Take 1-3 capsules (100-300 mg total) by mouth up to every 8 (eight) hours as needed for leg and foot pain. 90 capsule 3   levothyroxine  (SYNTHROID ) 100 MCG tablet Take 1 tablet (100 mcg total) by mouth daily before breakfast. 90 tablet 3   losartan  (COZAAR ) 50 MG tablet Take 1 tablet (50 mg total) by mouth daily. 90 tablet 3   methocarbamol  (ROBAXIN ) 500 MG tablet Take 1.5 tablets (750 mg total) by mouth every 8 (eight) hours as needed for muscle spasms (back pain). 45 tablet 2   traMADol  (ULTRAM ) 50 MG tablet Take 1 tablet (50 mg total) by mouth every 8 (eight) hours as needed for up to 7 days for  severe pain (pain score 7-10) or moderate pain (pain score 4-6). 20 tablet 0   No current facility-administered medications for this visit.    REVIEW OF SYSTEMS:    Review of Systems - Oncology  All other pertinent systems were reviewed and were negative except as mentioned above.  PHYSICAL EXAMINATION:   Onc Performance Status - 05/23/24 1437       ECOG Perf Status   ECOG Perf Status Ambulatory and capable of all selfcare  but unable to carry out any work activities.  Up and about more than 50% of waking hours      KPS SCALE   KPS % SCORE Cares for self, unable to carry on normal activity or to do active work          Vitals:   05/23/24 1426  BP: (!) 165/67  Pulse: 68  Resp: 18  Temp: 97.7 F (36.5 C)  SpO2: 97%   Filed Weights   05/23/24 1426  Weight: 155 lb (70.3 kg)    Physical Exam Constitutional:      General: She is not in acute distress.    Appearance: Normal appearance.  HENT:     Head: Normocephalic and atraumatic.  Cardiovascular:     Rate and Rhythm: Normal rate.  Pulmonary:     Effort: Pulmonary effort is normal. No respiratory distress.  Abdominal:     General: There is no distension.  Neurological:     General: No focal deficit present.     Mental Status: She is alert and oriented to person, place, and time.  Psychiatric:        Mood and Affect: Mood normal.        Behavior: Behavior normal.      LABORATORY DATA:   I have reviewed the data as listed.  Results for orders placed or performed in visit on 05/23/24  CBC with Differential (Cancer Center Only)  Result Value Ref Range   WBC Count 6.2 4.0 - 10.5 K/uL   RBC 4.18 3.87 - 5.11 MIL/uL   Hemoglobin 11.1 (L) 12.0 - 15.0 g/dL   HCT 64.3 (L) 63.9 - 53.9 %   MCV 85.2 80.0 - 100.0 fL   MCH 26.6 26.0 - 34.0 pg   MCHC 31.2 30.0 - 36.0 g/dL   RDW 78.5 (H) 88.4 - 84.4 %   Platelet Count 189 150 - 400 K/uL   nRBC 0.0 0.0 - 0.2 %   Neutrophils Relative % 51 %   Neutro Abs 3.2 1.7 -  7.7 K/uL   Lymphocytes Relative 36 %   Lymphs Abs 2.2 0.7 - 4.0 K/uL   Monocytes Relative 9 %   Monocytes Absolute 0.5 0.1 - 1.0 K/uL   Eosinophils Relative 3 %   Eosinophils Absolute 0.2 0.0 - 0.5 K/uL   Basophils Relative 1 %   Basophils Absolute 0.0 0.0 - 0.1 K/uL   Immature Granulocytes 0 %   Abs Immature Granulocytes 0.02 0.00 - 0.07 K/uL    Recent Labs    09/24/23 1233 04/17/24 1221  NA 140 142  K 5.1 4.3  CL 101 106  CO2 27 23  GLUCOSE 75 80  BUN 14 13  CREATININE 0.79 0.68  CALCIUM 9.6 9.3  PROT 7.0 6.9  ALBUMIN 4.0 4.0  AST 16 17  ALT 9 10  ALKPHOS 85 69  BILITOT 0.3 0.3     RADIOGRAPHIC STUDIES: No recent pertinent imaging studies available to review.  Orders Placed This Encounter  Procedures   CBC with Differential (Cancer Center Only)    Standing Status:   Future    Number of Occurrences:   1    Expiration Date:   05/23/2025   Iron and TIBC    Standing Status:   Future    Number of Occurrences:   1    Expiration Date:   05/23/2025   Ferritin    Standing Status:   Future    Number of Occurrences:  1    Expiration Date:   05/23/2025   Folate    Standing Status:   Future    Number of Occurrences:   1    Expiration Date:   05/23/2025    Future Appointments  Date Time Provider Department Center  08/15/2024  2:10 PM [facility]-ANNUAL WELLNESS VISIT [facility] [id number]  08/29/2024 11:45 AM Early, Sara E, NP [facility] [phone] [place]     I spent a total of 40 minutes during this encounter with the patient including review of chart and various tests results, discussions about plan of care and coordination of care plan.  This document was completed utilizing speech recognition software. Grammatical errors, random word insertions, pronoun errors, and incomplete sentences are an occasional consequence of this system due to software limitations, ambient noise, and hardware issues. Any formal questions or concerns about the content, text or information contained within the  body of this dictation should be directly addressed to the provider for clarification.

## 2024-05-25 ENCOUNTER — Other Ambulatory Visit

## 2024-05-25 ENCOUNTER — Encounter: Admitting: Nurse Practitioner

## 2024-05-25 ENCOUNTER — Other Ambulatory Visit (HOSPITAL_BASED_OUTPATIENT_CLINIC_OR_DEPARTMENT_OTHER): Payer: Self-pay

## 2024-05-26 ENCOUNTER — Other Ambulatory Visit (HOSPITAL_BASED_OUTPATIENT_CLINIC_OR_DEPARTMENT_OTHER): Payer: Self-pay

## 2024-06-13 ENCOUNTER — Other Ambulatory Visit: Payer: Self-pay

## 2024-06-28 ENCOUNTER — Telehealth: Payer: Self-pay

## 2024-06-28 NOTE — Telephone Encounter (Signed)
 Gave pt a call, pt is coming up due for reenrollment on [place] Breztri ,left a HIPAA VM, will mail out pt portion and faxed provider portion.

## 2024-08-15 ENCOUNTER — Ambulatory Visit: Payer: Self-pay

## 2024-08-15 VITALS — Ht 65.0 in | Wt 155.0 lb

## 2024-08-15 DIAGNOSIS — Z0000 Encounter for general adult medical examination without abnormal findings: Secondary | ICD-10-CM | POA: Diagnosis not present

## 2024-08-15 NOTE — Progress Notes (Signed)
 Chief Complaint  Patient presents with   Medicare Wellness     Subjective:   Kathryn [name] is a 89 y.o. female who presents for a [employer] Visit.  Allergies (verified) Lisinopril   History: Past Medical History:  Diagnosis Date   Arthritis    Chronic pain of right knee 08/07/2016   COPD (chronic obstructive pulmonary disease) (HCC)    Dyspnea    Encounter for subsequent annual wellness visit (AWV) in Medicare patient 01/10/2021   Encounter to establish care 01/02/2021   Hypertension    Hypothyroidism    Neck pain 12/06/2018   Osteopenia    Pneumonia    Pulmonary embolism (HCC) 04/2018   Screening for osteoporosis 01/02/2021   Thyroid  disease    Venous stasis of both lower extremities 01/10/2021   Past Surgical History:  Procedure Laterality Date   APPENDECTOMY     EYE SURGERY     TONSILLECTOMY AND ADENOIDECTOMY     TOTAL KNEE ARTHROPLASTY Right 01/01/2020   Procedure: RIGHT TOTAL KNEE ARTHROPLASTY;  Surgeon: Jerri Kay HERO, MD;  Location: [facility] OR;  Service: Orthopedics;  Laterality: Right;   TUBAL LIGATION     TUBAL LIGATION Bilateral    Family History  Problem Relation Age of Onset   Breast cancer Mother    Liver cancer Mother    Breast cancer Daughter 70   Colon cancer Daughter    Hypothyroidism Daughter    Lung cancer Daughter    Hypothyroidism Daughter    Hypothyroidism Daughter    Social History   Occupational History   Not on file  Tobacco Use   Smoking status: Former    Current packs/day: 0.00    Types: Cigarettes    Quit date: 2018    Years since quitting: 7.9   Smokeless tobacco: Never  Vaping Use   Vaping status: Never Used  Substance and Sexual Activity   Alcohol use: Never   Drug use: Never   Sexual activity: Not Currently    Birth control/protection: Abstinence   Tobacco Counseling Counseling given: Not Answered  SDOH Screenings   Food Insecurity: No Food Insecurity (08/15/2024)  Housing: Unknown (08/15/2024)   Transportation Needs: No Transportation Needs (08/15/2024)  Utilities: Not At Risk (08/15/2024)  Alcohol Screen: Low Risk  (08/15/2024)  Depression (PHQ2-9): Low Risk  (08/15/2024)  Recent Concern: Depression (PHQ2-9) - Medium Risk (05/23/2024)  Financial Resource Strain: Low Risk  (08/15/2024)  Physical Activity: Inactive (08/15/2024)  Social Connections: Socially Isolated (08/15/2024)  Stress: No Stress Concern Present (08/15/2024)  Tobacco Use: Medium Risk (08/15/2024)  Health Literacy: Adequate Health Literacy (08/15/2024)   See flowsheets for full screening details  Depression Screen PHQ 2 & 9 Depression Scale- Over the past 2 weeks, how often have you been bothered by any of the following problems? Little interest or pleasure in doing things: 0 Feeling down, depressed, or hopeless (PHQ Adolescent also includes...irritable): 0 PHQ-2 Total Score: 0 Trouble falling or staying asleep, or sleeping too much: 0 Feeling tired or having little energy: 3 Poor appetite or overeating (PHQ Adolescent also includes...weight loss): 0 Feeling bad about yourself - or that you are a failure or have let yourself or your family down: 0 Trouble concentrating on things, such as reading the newspaper or watching television (PHQ Adolescent also includes...like school work): 0 Moving or speaking so slowly that other people could have noticed. Or the opposite - being so fidgety or restless that you have been moving around a lot more than  usual: 0 Thoughts that you would be better off dead, or of hurting yourself in some way: 0 PHQ-9 Total Score: 3 If you checked off any problems, how difficult have these problems made it for you to do your work, take care of things at home, or get along with other people?: Not difficult at all  Depression Treatment Depression Interventions/Treatment : EYV7-0 Score <4 Follow-up Not Indicated     Goals Addressed             This Visit's Progress    Patient Stated        08/15/2024, stay alive       Visit info / Clinical Intake: Medicare Wellness Visit Type:: Subsequent Annual Wellness Visit Persons participating in visit:: patient & caregiver Medicare Wellness Visit Mode:: Video Because this visit was a virtual/telehealth visit:: pt reported vitals If Telephone or Video please confirm:: I connected with the patient using audio enabled telemedicine application and verified that I am speaking with the correct person using two identifiers; I discussed the limitations of evaluation and management by telemedicine; The patient expressed understanding and agreed to proceed Patient Location:: home Provider Location:: office Information given by:: patient; family Interpreter Needed?: No Pre-visit prep was completed: yes AWV questionnaire completed by patient prior to visit?: no Living arrangements:: with family/others Patient's Overall Health Status Rating: very good Typical amount of pain: (!) a lot Does pain affect daily life?: (!) yes (some days) Are you currently prescribed opioids?: no  Dietary Habits and Nutritional Risks How many meals a day?: 3 Eats fruit and vegetables daily?: yes Most meals are obtained by: preparing own meals In the last 2 weeks, have you had any of the following?: none Diabetic:: no  Functional Status Activities of Daily Living (to include ambulation/medication): Independent Ambulation: Independent Medication Administration: Independent Home Management: Independent Manage your own finances?: yes Primary transportation is: family/friends Concerns about vision?: no *vision screening is required for WTM* Concerns about hearing?: no  Fall Screening Falls in the past year?: 0 Number of falls in past year: 0 Was there an injury with Fall?: 0 Fall Risk Category Calculator: 0 Patient Fall Risk Level: Low Fall Risk  Fall Risk Patient at Risk for Falls Due to: Impaired balance/gait; Medication side effect Fall risk  Follow up: Falls prevention discussed; Falls evaluation completed  Home and Transportation Safety: All rugs have non-skid backing?: (!) no All stairs or steps have railings?: yes Grab bars in the bathtub or shower?: yes Have non-skid surface in bathtub or shower?: yes Good home lighting?: yes Regular seat belt use?: yes Hospital stays in the last year:: no  Cognitive Assessment Difficulty concentrating, remembering, or making decisions? : no Will 6CIT or Mini Cog be Completed: yes What year is it?: 0 points What month is it?: 0 points Give patient an address phrase to remember (5 components): [address] About what time is it?: 0 points Count backwards from 20 to 1: 0 points Say the months of the year in reverse: 0 points Repeat the address phrase from earlier: 0 points 6 CIT Score: 0 points  Advance Directives (For Healthcare) Does Patient Have a Medical Advance Directive?: Yes Does patient want to make changes to medical advance directive?: No - Patient declined Type of Advance Directive: Healthcare Power of [place]; Living will Copy of Healthcare Power of Attorney in Chart?: No - copy requested Copy of Living Will in Chart?: No - copy requested Would patient like information on creating a medical advance  directive?: No - Patient declined  Reviewed/Updated  Reviewed/Updated: Reviewed All (Medical, Surgical, Family, Medications, Allergies, Care Teams, Patient Goals)        Objective:    Today's Vitals   08/15/24 1356  Weight: 155 lb (70.3 kg)  Height: 5' 5 (1.651 m)   Body mass index is 25.79 kg/m.  Current Medications (verified) Outpatient Encounter Medications as of 08/15/2024  Medication Sig   acetaminophen  (TYLENOL ) 500 MG tablet 1 tablet as needed   b complex vitamins capsule Take 1 capsule by mouth daily.   Cholecalciferol (VITAMIN D3) 50 MCG (2000 UT) TABS Take 2,000 Units by mouth daily.   dorzolamide  (TRUSOPT ) 2 % ophthalmic solution Place 1  drop into both eyes every 12 (twelve) hours.   gabapentin  (NEURONTIN ) 100 MG capsule Take 1-3 capsules (100-300 mg total) by mouth up to every 8 (eight) hours as needed for leg and foot pain.   levothyroxine  (SYNTHROID ) 100 MCG tablet Take 1 tablet (100 mcg total) by mouth daily before breakfast.   losartan  (COZAAR ) 50 MG tablet Take 1 tablet (50 mg total) by mouth daily.   methocarbamol  (ROBAXIN ) 500 MG tablet Take 1.5 tablets (750 mg total) by mouth every 8 (eight) hours as needed for muscle spasms (back pain).   budesonide-glycopyrrolate-formoterol (BREZTRI  AEROSPHERE) 160-9-4.8 MCG/ACT AERO inhaler Inhale 2 puffs into the lungs 2 (two) times daily. (Patient not taking: Reported on 08/15/2024)   No facility-administered encounter medications on file as of 08/15/2024.   Hearing/Vision screen Hearing Screening - Comments:: Denies hearing issues Vision Screening - Comments:: Regular eye exams, Dr. Glendia Immunizations and Health Maintenance Health Maintenance  Topic Date Due   Zoster Vaccines- Shingrix (1 of 2) Never done   Influenza Vaccine  04/21/2024   COVID-19 Vaccine (4 - 2025-26 season) 05/22/2024   Bone Density Scan  04/17/2025 (Originally 02/03/2002)   Medicare Annual Wellness (AWV)  08/15/2025   DTaP/Tdap/Td (5 - Td or Tdap) 02/24/2033   Pneumococcal Vaccine: 50+ Years  Completed   Meningococcal B Vaccine  Aged Out        Assessment/Plan:  This is a routine wellness examination for Torrie .  Patient Care Team: Early, Camie BRAVO, NP as PCP - General (Nurse Practitioner) Glendia Simmonds, OD as Referring Physician (Optometry) Gasper, Alm CHRISTELLA Raddle., DMD (Dentistry)  I have personally reviewed and noted the following in the patient's chart:   Medical and social history Use of alcohol, tobacco or illicit drugs  Current medications and supplements including opioid prescriptions. Functional ability and status Nutritional status Physical activity Advanced directives List of other  physicians Hospitalizations, surgeries, and ER visits in previous 12 months Vitals Screenings to include cognitive, depression, and falls Referrals and appointments  No orders of the defined types were placed in this encounter.  In addition, I have reviewed and discussed with patient certain preventive protocols, quality metrics, and best practice recommendations. A written personalized care plan for preventive services as well as general preventive health recommendations were provided to patient.   Ardella BRAVO Dawn, LPN   [id number]/7974   Return in 1 year (on 08/15/2025).  After Visit Summary: (MyChart) Due to this being a telephonic visit, the after visit summary with patients personalized plan was offered to patient via MyChart   Nurse Notes: States will get flu vaccine at 12/9 appointment. Due for covid and shingles vaccine.

## 2024-08-15 NOTE — Patient Instructions (Signed)
 Ms. Bellot,  Thank you for taking the time for your Medicare Wellness Visit. I appreciate your continued commitment to your health goals. Please review the care plan we discussed, and feel free to reach out if I can assist you further.  Please note that Annual Wellness Visits do not include a physical exam. Some assessments may be limited, especially if the visit was conducted virtually. If needed, we may recommend an in-person follow-up with your provider.  Ongoing Care Seeing your primary care provider every 3 to 6 months helps us  monitor your health and provide consistent, personalized care.   Referrals If a referral was made during today's visit and you haven't received any updates within two weeks, please contact the referred provider directly to check on the status.  Recommended Screenings:  Health Maintenance  Topic Date Due   Zoster (Shingles) Vaccine (1 of 2) Never done   Medicare Annual Wellness Visit  02/25/2024   Flu Shot  04/21/2024   COVID-19 Vaccine (4 - 2025-26 season) 05/22/2024   Osteoporosis screening with Bone Density Scan  04/17/2025*   DTaP/Tdap/Td vaccine (5 - Td or Tdap) 02/24/2033   Pneumococcal Vaccine for age over 25  Completed   Meningitis B Vaccine  Aged Out  *Topic was postponed. The date shown is not the original due date.       08/15/2024    2:05 PM  Advanced Directives  Does Patient Have a Medical Advance Directive? Yes  Type of Estate Agent of [place];Living will  Copy of Healthcare Power of Attorney in Chart? No - copy requested    Vision: Annual vision screenings are recommended for early detection of glaucoma, cataracts, and diabetic retinopathy. These exams can also reveal signs of chronic conditions such as diabetes and high blood pressure.  Dental: Annual dental screenings help detect early signs of oral cancer, gum disease, and other conditions linked to overall health, including heart disease and diabetes.  Please  see the attached documents for additional preventive care recommendations.

## 2024-08-21 ENCOUNTER — Other Ambulatory Visit (HOSPITAL_BASED_OUTPATIENT_CLINIC_OR_DEPARTMENT_OTHER): Payer: Self-pay

## 2024-08-23 NOTE — Progress Notes (Unsigned)
 Flu: getting today  Covid: not today   Shingles: not today   Catheline Doing, DNP, AGNP-c [facility]  [address] [place], [place] [address] [phone]  ESTABLISHED PATIENT- Chronic Health and/or Follow-Up Visit on 08/29/2024  Blood pressure (!) 148/82, pulse 76, weight 155 lb 3.2 oz (70.4 kg).   HPI:  History of Present Illness Kathryn  Kathryn [name] is an 89 year old female with hypertension and chronic pain who presents with fatigue and fluctuating blood pressure.  She experiences persistent fatigue despite having undergone iron infusions, with no significant improvement in energy levels. She does not require naps during the day but feels tired consistently, occasionally 'resting her eyes'.  Her blood pressure has been fluctuating, with a recent reading of 180/85 during a wellness check. No headaches or dizziness are associated with these fluctuations. She occasionally checks her blood pressure at home and notes it can be high at times.  She experiences chronic pain primarily in her feet, back, and neck. Her knees sometimes buckle, particularly when trying to get into a tall truck, and she has experienced episodes of her knees giving out while standing. She uses a cane when outside but relies on furniture for support at home. She has a history of knee replacement surgery and has previously undergone physical therapy.  She takes gabapentin  100 mg, primarily at bedtime, to manage numbness and pain in her legs and feet, sometimes taking it in the morning if needed. Her feet feel numb and occasionally experience burning and jittery sensations. She also reports symptoms consistent with restless leg syndrome.  She experiences frequent nocturia, waking up four times a night to urinate, and notes that her feet swell, particularly the one with prior knee surgery. She has a history of prolapse, which she believes contributes to her urinary symptoms. She does not drink excessive  fluids before bed.  She reports difficulty sleeping at night, often due to her mind racing. She sometimes falls asleep with the TV on but frequently wakes up to use the bathroom. No chest pain, but she experiences nasal drip, which has been persistent for years. All ROS negative with exception of what is listed above.    PHYSICAL EXAM Physical Exam Vitals and nursing note reviewed.  Constitutional:      Appearance: Normal appearance.  HENT:     Head: Normocephalic.  Eyes:     Pupils: Pupils are equal, round, and reactive to light.  Cardiovascular:     Rate and Rhythm: Normal rate and regular rhythm.     Pulses: Normal pulses.     Heart sounds: Normal heart sounds.  Pulmonary:     Effort: Pulmonary effort is normal.     Breath sounds: Normal breath sounds.  Skin:    General: Skin is warm and dry.  Neurological:     General: No focal deficit present.     Mental Status: She is alert and oriented to person, place, and time.     Sensory: Sensory deficit present.     Motor: Weakness present.  Psychiatric:        Mood and Affect: Mood normal.     PLAN Problem List Items Addressed This Visit     Iron deficiency anemia, unspecified   Relevant Orders   CBC with Differential/Platelet   Iron, TIBC and Ferritin Panel   Benign essential hypertension - Primary   Relevant Medications   losartan  (COZAAR ) 50 MG tablet   Hypothyroidism   Relevant Orders   TSH   T4,  free   Anemia   Relevant Orders   CBC with Differential/Platelet   Iron, TIBC and Ferritin Panel   Neuropathy associated with thyroid  disease   Relevant Medications   gabapentin  (NEURONTIN ) 300 MG capsule   Overactive bladder   Osteoarthritis of knee   Relevant Medications   gabapentin  (NEURONTIN ) 300 MG capsule   Other Visit Diagnoses       Need for influenza vaccination       Relevant Orders   Flu vaccine HIGH DOSE PF(Fluzone Trivalent) (Completed)         No follow-ups on file.  Kathryn Early, DNP,  AGNP-c Time: *** minutes, >50% spent counseling, care coordination, chart review, and documentation.  SABRAtime

## 2024-08-29 ENCOUNTER — Ambulatory Visit: Payer: Self-pay | Admitting: Nurse Practitioner

## 2024-08-29 ENCOUNTER — Other Ambulatory Visit (HOSPITAL_BASED_OUTPATIENT_CLINIC_OR_DEPARTMENT_OTHER): Payer: Self-pay

## 2024-08-29 ENCOUNTER — Encounter: Payer: Self-pay | Admitting: Nurse Practitioner

## 2024-08-29 VITALS — BP 148/82 | HR 76 | Wt 155.2 lb

## 2024-08-29 DIAGNOSIS — D649 Anemia, unspecified: Secondary | ICD-10-CM

## 2024-08-29 DIAGNOSIS — Z23 Encounter for immunization: Secondary | ICD-10-CM | POA: Diagnosis not present

## 2024-08-29 DIAGNOSIS — M170 Bilateral primary osteoarthritis of knee: Secondary | ICD-10-CM

## 2024-08-29 DIAGNOSIS — I10 Essential (primary) hypertension: Secondary | ICD-10-CM | POA: Diagnosis not present

## 2024-08-29 DIAGNOSIS — D509 Iron deficiency anemia, unspecified: Secondary | ICD-10-CM

## 2024-08-29 DIAGNOSIS — N3281 Overactive bladder: Secondary | ICD-10-CM | POA: Diagnosis not present

## 2024-08-29 DIAGNOSIS — G63 Polyneuropathy in diseases classified elsewhere: Secondary | ICD-10-CM

## 2024-08-29 DIAGNOSIS — E079 Disorder of thyroid, unspecified: Secondary | ICD-10-CM

## 2024-08-29 DIAGNOSIS — E039 Hypothyroidism, unspecified: Secondary | ICD-10-CM

## 2024-08-29 MED ORDER — GABAPENTIN 300 MG PO CAPS
300.0000 mg | ORAL_CAPSULE | Freq: Three times a day (TID) | ORAL | 1 refills | Status: AC
  Filled 2024-08-29: qty 90, 30d supply, fill #0

## 2024-08-29 MED ORDER — LOSARTAN POTASSIUM 50 MG PO TABS
50.0000 mg | ORAL_TABLET | Freq: Every day | ORAL | 3 refills | Status: AC
  Filled 2024-08-29: qty 90, 90d supply, fill #0

## 2024-08-29 NOTE — Patient Instructions (Signed)
 I have increased your gabapentin  to 300mg  three times a day. If this does not help, please let me know and we can increase more.   Let me know if your blood pressures are staying higher than 140/80.

## 2024-08-30 ENCOUNTER — Ambulatory Visit: Payer: Self-pay | Admitting: Nurse Practitioner

## 2024-08-30 LAB — CBC WITH DIFFERENTIAL/PLATELET
Basophils Absolute: 0.1 x10E3/uL (ref 0.0–0.2)
Basos: 1 %
EOS (ABSOLUTE): 0.2 x10E3/uL (ref 0.0–0.4)
Eos: 3 %
Hematocrit: 37.5 % (ref 34.0–46.6)
Hemoglobin: 11.8 g/dL (ref 11.1–15.9)
Immature Grans (Abs): 0 x10E3/uL (ref 0.0–0.1)
Immature Granulocytes: 0 %
Lymphocytes Absolute: 1.8 x10E3/uL (ref 0.7–3.1)
Lymphs: 25 %
MCH: 28.2 pg (ref 26.6–33.0)
MCHC: 31.5 g/dL (ref 31.5–35.7)
MCV: 90 fL (ref 79–97)
Monocytes Absolute: 0.6 x10E3/uL (ref 0.1–0.9)
Monocytes: 8 %
Neutrophils Absolute: 4.6 x10E3/uL (ref 1.4–7.0)
Neutrophils: 63 %
Platelets: 238 x10E3/uL (ref 150–450)
RBC: 4.19 x10E6/uL (ref 3.77–5.28)
RDW: 13.4 % (ref 11.7–15.4)
WBC: 7.3 x10E3/uL (ref 3.4–10.8)

## 2024-08-30 LAB — IRON,TIBC AND FERRITIN PANEL
Ferritin: 24 ng/mL (ref 15–150)
Iron Saturation: 11 % — AB (ref 15–55)
Iron: 42 ug/dL (ref 27–139)
Total Iron Binding Capacity: 391 ug/dL (ref 250–450)
UIBC: 349 ug/dL (ref 118–369)

## 2024-08-30 LAB — T4, FREE: Free T4: 1.48 ng/dL (ref 0.82–1.77)

## 2024-08-30 LAB — TSH: TSH: 2.1 u[IU]/mL (ref 0.450–4.500)

## 2024-08-30 NOTE — Assessment & Plan Note (Signed)
 Chronic numbness and pain in legs, with burning and jittery sensations. Current gabapentin  dose is low and may not be sufficient for symptom control. - Increased gabapentin  to 300 mg at bedtime. - Will consider further dose adjustment if symptoms persist. Orders:   gabapentin  (NEURONTIN ) 300 MG capsule; Take 1 capsule (300 mg total) by mouth 3 (three) times daily.

## 2024-08-30 NOTE — Assessment & Plan Note (Signed)
 Blood pressure elevated today, with previous readings as high as 180/85. No associated symptoms such as headaches or dizziness. Possible uncontrolled contribution from pain to elevated blood pressure. Patient is very hesitant to add additional medications and requests to wait for additional monitoring, which is reasonable.   - Continue to monitor blood pressure at home. - Increase gabapentin  for pain management - Will consider adding a diuretic to losartan  if fluid retention is contributing to nocturia. Orders:   losartan  (COZAAR ) 50 MG tablet; Take 1 tablet (50 mg total) by mouth daily.

## 2024-08-30 NOTE — Assessment & Plan Note (Signed)
 No current symptoms present. Labs pending today.  Orders:   TSH   T4, free

## 2024-08-30 NOTE — Assessment & Plan Note (Signed)
 Persistent fatigue despite previous iron infusions. Hemoglobin was 11 three months ago. No significant improvement in symptoms post-infusion. - Checked hemoglobin levels to assess current anemia status. Orders:   CBC with Differential/Platelet   Iron, TIBC and Ferritin Panel

## 2024-08-30 NOTE — Assessment & Plan Note (Signed)
 Frequent nocturia, sometimes up to 8 times per night. Possible contribution from fluid retention and prolapse. Patient not interested in interventions with pelvic PT or urogynecology evaluation.  - Will consider adding a diuretic Dereke Neumann in the day to help manage fluid retention and reduce nocturia.

## 2024-08-30 NOTE — Assessment & Plan Note (Signed)
 Chronic pain in knees, with episodes of buckling and weakness. Previous physical therapy was not beneficial. Pain may contribute to elevated blood pressure. - Continue using cane for stability when outside. - Consider Voltaren gel for symptomatic relief. Orders:   gabapentin  (NEURONTIN ) 300 MG capsule; Take 1 capsule (300 mg total) by mouth 3 (three) times daily.

## 2024-09-05 ENCOUNTER — Other Ambulatory Visit (HOSPITAL_BASED_OUTPATIENT_CLINIC_OR_DEPARTMENT_OTHER): Payer: Self-pay

## 2024-09-28 NOTE — Telephone Encounter (Signed)
 Gave [employer] ) a call to follow up on pt application representative explain that pt had call on 09/12/2024 and withdraw from receiving this medication, spoke with pt explain she does not want to proceed with the application she will not be using this medication.

## 2024-10-02 ENCOUNTER — Other Ambulatory Visit (HOSPITAL_COMMUNITY): Payer: Self-pay

## 2024-12-28 ENCOUNTER — Ambulatory Visit: Admitting: Nurse Practitioner

## 2025-08-21 ENCOUNTER — Ambulatory Visit
